# Patient Record
Sex: Female | Born: 1960
Health system: Southern US, Community
[De-identification: ages and names within clinical notes are randomized; demographics above are authoritative.]

## PROBLEM LIST (undated history)

## (undated) DIAGNOSIS — K429 Umbilical hernia without obstruction or gangrene: Secondary | ICD-10-CM

## (undated) DIAGNOSIS — R112 Nausea with vomiting, unspecified: Secondary | ICD-10-CM

## (undated) DIAGNOSIS — K760 Fatty (change of) liver, not elsewhere classified: Secondary | ICD-10-CM

## (undated) DIAGNOSIS — N390 Urinary tract infection, site not specified: Secondary | ICD-10-CM

## (undated) DIAGNOSIS — M1990 Unspecified osteoarthritis, unspecified site: Secondary | ICD-10-CM

## (undated) DIAGNOSIS — M5414 Radiculopathy, thoracic region: Secondary | ICD-10-CM

## (undated) DIAGNOSIS — I10 Essential (primary) hypertension: Secondary | ICD-10-CM

## (undated) DIAGNOSIS — R7303 Prediabetes: Secondary | ICD-10-CM

## (undated) DIAGNOSIS — K219 Gastro-esophageal reflux disease without esophagitis: Secondary | ICD-10-CM

## (undated) DIAGNOSIS — E785 Hyperlipidemia, unspecified: Secondary | ICD-10-CM

## (undated) DIAGNOSIS — G4730 Sleep apnea, unspecified: Secondary | ICD-10-CM

## (undated) DIAGNOSIS — Z9889 Other specified postprocedural states: Secondary | ICD-10-CM

## (undated) HISTORY — DX: Hyperlipidemia, unspecified: E78.5

## (undated) HISTORY — DX: Prediabetes: R73.03

## (undated) HISTORY — PX: CARPAL TUNNEL RELEASE: SHX101

## (undated) HISTORY — PX: LAMINECTOMY AND MICRODISCECTOMY LUMBAR SPINE: SHX1913

## (undated) HISTORY — DX: Radiculopathy, thoracic region: M54.14

---

## 1997-11-23 ENCOUNTER — Encounter: Admission: RE | Admit: 1997-11-23 | Discharge: 1997-11-23 | Payer: Self-pay | Admitting: Sports Medicine

## 1998-04-13 ENCOUNTER — Encounter: Admission: RE | Admit: 1998-04-13 | Discharge: 1998-04-13 | Payer: Self-pay | Admitting: Family Medicine

## 1998-04-27 ENCOUNTER — Encounter: Admission: RE | Admit: 1998-04-27 | Discharge: 1998-04-27 | Payer: Self-pay | Admitting: Family Medicine

## 1998-08-09 ENCOUNTER — Ambulatory Visit (HOSPITAL_BASED_OUTPATIENT_CLINIC_OR_DEPARTMENT_OTHER): Admission: RE | Admit: 1998-08-09 | Discharge: 1998-08-09 | Payer: Self-pay | Admitting: Orthopedic Surgery

## 1998-11-21 ENCOUNTER — Encounter: Admission: RE | Admit: 1998-11-21 | Discharge: 1998-11-21 | Payer: Self-pay | Admitting: Family Medicine

## 1999-01-27 ENCOUNTER — Encounter: Admission: RE | Admit: 1999-01-27 | Discharge: 1999-01-27 | Payer: Self-pay | Admitting: Family Medicine

## 1999-02-27 ENCOUNTER — Encounter: Admission: RE | Admit: 1999-02-27 | Discharge: 1999-02-27 | Payer: Self-pay | Admitting: Family Medicine

## 1999-02-28 ENCOUNTER — Encounter: Admission: RE | Admit: 1999-02-28 | Discharge: 1999-02-28 | Payer: Self-pay | Admitting: Sports Medicine

## 1999-04-17 ENCOUNTER — Emergency Department (HOSPITAL_COMMUNITY): Admission: EM | Admit: 1999-04-17 | Discharge: 1999-04-17 | Payer: Self-pay | Admitting: Emergency Medicine

## 1999-04-17 ENCOUNTER — Encounter: Payer: Self-pay | Admitting: Emergency Medicine

## 2000-10-08 ENCOUNTER — Encounter: Admission: RE | Admit: 2000-10-08 | Discharge: 2000-10-08 | Payer: Self-pay | Admitting: Sports Medicine

## 2001-01-29 ENCOUNTER — Encounter: Admission: RE | Admit: 2001-01-29 | Discharge: 2001-01-29 | Payer: Self-pay | Admitting: Family Medicine

## 2001-11-10 ENCOUNTER — Encounter: Admission: RE | Admit: 2001-11-10 | Discharge: 2001-11-10 | Payer: Self-pay | Admitting: Sports Medicine

## 2002-06-10 ENCOUNTER — Encounter: Admission: RE | Admit: 2002-06-10 | Discharge: 2002-06-10 | Payer: Self-pay | Admitting: Sports Medicine

## 2002-08-20 HISTORY — PX: ABDOMINAL HYSTERECTOMY: SHX81

## 2002-08-29 ENCOUNTER — Encounter: Payer: Self-pay | Admitting: Emergency Medicine

## 2002-08-29 ENCOUNTER — Emergency Department (HOSPITAL_COMMUNITY): Admission: EM | Admit: 2002-08-29 | Discharge: 2002-08-29 | Payer: Self-pay | Admitting: Emergency Medicine

## 2002-09-01 ENCOUNTER — Encounter: Payer: Self-pay | Admitting: Obstetrics and Gynecology

## 2002-09-01 ENCOUNTER — Ambulatory Visit (HOSPITAL_COMMUNITY): Admission: RE | Admit: 2002-09-01 | Discharge: 2002-09-01 | Payer: Self-pay | Admitting: Obstetrics and Gynecology

## 2002-09-22 ENCOUNTER — Other Ambulatory Visit: Admission: RE | Admit: 2002-09-22 | Discharge: 2002-09-22 | Payer: Self-pay | Admitting: Obstetrics and Gynecology

## 2002-10-21 ENCOUNTER — Inpatient Hospital Stay (HOSPITAL_COMMUNITY): Admission: RE | Admit: 2002-10-21 | Discharge: 2002-10-23 | Payer: Self-pay | Admitting: Obstetrics and Gynecology

## 2002-10-21 ENCOUNTER — Encounter (INDEPENDENT_AMBULATORY_CARE_PROVIDER_SITE_OTHER): Payer: Self-pay

## 2003-01-19 ENCOUNTER — Observation Stay (HOSPITAL_COMMUNITY): Admission: RE | Admit: 2003-01-19 | Discharge: 2003-01-20 | Payer: Self-pay | Admitting: Neurosurgery

## 2003-01-19 ENCOUNTER — Encounter: Payer: Self-pay | Admitting: Neurosurgery

## 2003-11-19 ENCOUNTER — Emergency Department (HOSPITAL_COMMUNITY): Admission: EM | Admit: 2003-11-19 | Discharge: 2003-11-19 | Payer: Self-pay | Admitting: Emergency Medicine

## 2004-01-24 ENCOUNTER — Encounter: Admission: RE | Admit: 2004-01-24 | Discharge: 2004-01-24 | Payer: Self-pay | Admitting: Family Medicine

## 2004-03-21 ENCOUNTER — Emergency Department (HOSPITAL_COMMUNITY): Admission: EM | Admit: 2004-03-21 | Discharge: 2004-03-21 | Payer: Self-pay

## 2004-03-31 ENCOUNTER — Encounter: Admission: RE | Admit: 2004-03-31 | Discharge: 2004-03-31 | Payer: Self-pay | Admitting: Family Medicine

## 2004-05-02 ENCOUNTER — Ambulatory Visit: Payer: Self-pay | Admitting: Family Medicine

## 2004-05-04 ENCOUNTER — Ambulatory Visit: Payer: Self-pay | Admitting: Family Medicine

## 2004-08-02 ENCOUNTER — Encounter: Admission: RE | Admit: 2004-08-02 | Discharge: 2004-08-02 | Payer: Self-pay | Admitting: *Deleted

## 2004-09-19 ENCOUNTER — Ambulatory Visit: Payer: Self-pay | Admitting: Family Medicine

## 2004-09-22 ENCOUNTER — Encounter: Admission: RE | Admit: 2004-09-22 | Discharge: 2004-12-21 | Payer: Self-pay | Admitting: Sports Medicine

## 2004-09-28 ENCOUNTER — Ambulatory Visit: Payer: Self-pay | Admitting: Family Medicine

## 2004-11-18 HISTORY — PX: BACK SURGERY: SHX140

## 2005-02-03 ENCOUNTER — Encounter: Admission: RE | Admit: 2005-02-03 | Discharge: 2005-02-03 | Payer: Self-pay | Admitting: Orthopedic Surgery

## 2005-02-06 ENCOUNTER — Encounter: Admission: RE | Admit: 2005-02-06 | Discharge: 2005-02-06 | Payer: Self-pay | Admitting: Orthopedic Surgery

## 2005-02-15 ENCOUNTER — Encounter: Admission: RE | Admit: 2005-02-15 | Discharge: 2005-02-15 | Payer: Self-pay | Admitting: Orthopedic Surgery

## 2005-03-02 ENCOUNTER — Ambulatory Visit: Payer: Self-pay | Admitting: Family Medicine

## 2005-05-23 ENCOUNTER — Ambulatory Visit (HOSPITAL_COMMUNITY): Admission: RE | Admit: 2005-05-23 | Discharge: 2005-05-24 | Payer: Self-pay | Admitting: Neurological Surgery

## 2005-08-31 ENCOUNTER — Ambulatory Visit: Payer: Self-pay | Admitting: Sports Medicine

## 2005-09-03 ENCOUNTER — Ambulatory Visit: Payer: Self-pay | Admitting: Sports Medicine

## 2005-09-11 ENCOUNTER — Ambulatory Visit: Payer: Self-pay | Admitting: Sports Medicine

## 2005-10-17 ENCOUNTER — Ambulatory Visit: Payer: Self-pay | Admitting: Sports Medicine

## 2005-10-25 ENCOUNTER — Ambulatory Visit: Payer: Self-pay | Admitting: Family Medicine

## 2005-11-02 ENCOUNTER — Ambulatory Visit: Payer: Self-pay | Admitting: Sports Medicine

## 2005-11-08 ENCOUNTER — Ambulatory Visit: Payer: Self-pay | Admitting: Family Medicine

## 2005-11-28 ENCOUNTER — Ambulatory Visit: Payer: Self-pay | Admitting: Family Medicine

## 2005-12-03 ENCOUNTER — Ambulatory Visit: Payer: Self-pay | Admitting: Family Medicine

## 2005-12-04 ENCOUNTER — Encounter: Admission: RE | Admit: 2005-12-04 | Discharge: 2005-12-04 | Payer: Self-pay | Admitting: Family Medicine

## 2005-12-17 ENCOUNTER — Ambulatory Visit: Payer: Self-pay | Admitting: Family Medicine

## 2005-12-19 ENCOUNTER — Ambulatory Visit: Payer: Self-pay | Admitting: Sports Medicine

## 2006-04-29 ENCOUNTER — Ambulatory Visit: Payer: Self-pay | Admitting: Sports Medicine

## 2006-05-03 ENCOUNTER — Ambulatory Visit: Payer: Self-pay | Admitting: Family Medicine

## 2006-06-19 ENCOUNTER — Emergency Department (HOSPITAL_COMMUNITY): Admission: EM | Admit: 2006-06-19 | Discharge: 2006-06-19 | Payer: Self-pay | Admitting: Emergency Medicine

## 2006-10-17 DIAGNOSIS — M1710 Unilateral primary osteoarthritis, unspecified knee: Secondary | ICD-10-CM

## 2006-10-17 DIAGNOSIS — G4700 Insomnia, unspecified: Secondary | ICD-10-CM | POA: Insufficient documentation

## 2006-10-17 DIAGNOSIS — IMO0002 Reserved for concepts with insufficient information to code with codable children: Secondary | ICD-10-CM | POA: Insufficient documentation

## 2006-10-17 DIAGNOSIS — K5900 Constipation, unspecified: Secondary | ICD-10-CM | POA: Insufficient documentation

## 2006-10-17 DIAGNOSIS — I10 Essential (primary) hypertension: Secondary | ICD-10-CM | POA: Insufficient documentation

## 2006-10-28 ENCOUNTER — Telehealth (INDEPENDENT_AMBULATORY_CARE_PROVIDER_SITE_OTHER): Payer: Self-pay | Admitting: *Deleted

## 2006-10-29 ENCOUNTER — Encounter (INDEPENDENT_AMBULATORY_CARE_PROVIDER_SITE_OTHER): Payer: Self-pay | Admitting: Family Medicine

## 2006-10-29 ENCOUNTER — Ambulatory Visit: Payer: Self-pay | Admitting: Family Medicine

## 2006-10-29 DIAGNOSIS — J309 Allergic rhinitis, unspecified: Secondary | ICD-10-CM | POA: Insufficient documentation

## 2006-10-29 DIAGNOSIS — R7989 Other specified abnormal findings of blood chemistry: Secondary | ICD-10-CM | POA: Insufficient documentation

## 2006-10-29 LAB — CONVERTED CEMR LAB
Chloride: 102 meq/L (ref 96–112)
Hgb A1c MFr Bld: 5.6 %
Potassium: 4.4 meq/L (ref 3.5–5.3)
Protein, U semiquant: NEGATIVE

## 2006-10-30 ENCOUNTER — Telehealth: Payer: Self-pay | Admitting: *Deleted

## 2006-10-30 ENCOUNTER — Telehealth (INDEPENDENT_AMBULATORY_CARE_PROVIDER_SITE_OTHER): Payer: Self-pay | Admitting: Family Medicine

## 2006-11-04 ENCOUNTER — Telehealth (INDEPENDENT_AMBULATORY_CARE_PROVIDER_SITE_OTHER): Payer: Self-pay | Admitting: *Deleted

## 2006-11-07 ENCOUNTER — Ambulatory Visit (HOSPITAL_BASED_OUTPATIENT_CLINIC_OR_DEPARTMENT_OTHER): Admission: RE | Admit: 2006-11-07 | Discharge: 2006-11-07 | Payer: Self-pay | Admitting: Family Medicine

## 2006-11-10 ENCOUNTER — Ambulatory Visit: Payer: Self-pay | Admitting: Internal Medicine

## 2006-11-10 ENCOUNTER — Encounter (INDEPENDENT_AMBULATORY_CARE_PROVIDER_SITE_OTHER): Payer: Self-pay | Admitting: Family Medicine

## 2006-11-10 DIAGNOSIS — G4733 Obstructive sleep apnea (adult) (pediatric): Secondary | ICD-10-CM | POA: Insufficient documentation

## 2006-11-27 ENCOUNTER — Encounter (INDEPENDENT_AMBULATORY_CARE_PROVIDER_SITE_OTHER): Payer: Self-pay | Admitting: Family Medicine

## 2006-12-18 ENCOUNTER — Telehealth: Payer: Self-pay | Admitting: *Deleted

## 2007-07-30 ENCOUNTER — Encounter (INDEPENDENT_AMBULATORY_CARE_PROVIDER_SITE_OTHER): Payer: Self-pay | Admitting: Family Medicine

## 2007-07-30 ENCOUNTER — Ambulatory Visit: Payer: Self-pay | Admitting: Family Medicine

## 2007-07-30 LAB — CONVERTED CEMR LAB
Hgb A1c MFr Bld: 6 %
Whiff Test: NEGATIVE

## 2007-07-31 ENCOUNTER — Encounter (INDEPENDENT_AMBULATORY_CARE_PROVIDER_SITE_OTHER): Payer: Self-pay | Admitting: Family Medicine

## 2007-07-31 LAB — CONVERTED CEMR LAB
ALT: 22 units/L (ref 0–35)
AST: 20 units/L (ref 0–37)
Albumin: 4.1 g/dL (ref 3.5–5.2)
Alkaline Phosphatase: 69 units/L (ref 39–117)
Calcium: 9 mg/dL (ref 8.4–10.5)
Chloride: 105 meq/L (ref 96–112)
Creatinine, Ser: 0.81 mg/dL (ref 0.40–1.20)
Potassium: 4.4 meq/L (ref 3.5–5.3)

## 2007-10-21 ENCOUNTER — Encounter: Admission: RE | Admit: 2007-10-21 | Discharge: 2007-10-21 | Payer: Self-pay | Admitting: Orthopedic Surgery

## 2007-11-07 ENCOUNTER — Encounter: Admission: RE | Admit: 2007-11-07 | Discharge: 2007-11-07 | Payer: Self-pay | Admitting: Orthopedic Surgery

## 2007-11-28 ENCOUNTER — Encounter: Admission: RE | Admit: 2007-11-28 | Discharge: 2007-11-28 | Payer: Self-pay | Admitting: Orthopedic Surgery

## 2007-12-19 ENCOUNTER — Encounter: Admission: RE | Admit: 2007-12-19 | Discharge: 2007-12-19 | Payer: Self-pay | Admitting: Orthopedic Surgery

## 2008-04-12 ENCOUNTER — Ambulatory Visit: Payer: Self-pay | Admitting: Sports Medicine

## 2008-04-12 ENCOUNTER — Telehealth: Payer: Self-pay | Admitting: *Deleted

## 2008-04-28 ENCOUNTER — Encounter: Admission: RE | Admit: 2008-04-28 | Discharge: 2008-04-28 | Payer: Self-pay | Admitting: Family Medicine

## 2008-05-14 ENCOUNTER — Ambulatory Visit: Payer: Self-pay | Admitting: Family Medicine

## 2008-05-14 DIAGNOSIS — M549 Dorsalgia, unspecified: Secondary | ICD-10-CM | POA: Insufficient documentation

## 2008-05-25 ENCOUNTER — Telehealth (INDEPENDENT_AMBULATORY_CARE_PROVIDER_SITE_OTHER): Payer: Self-pay | Admitting: Family Medicine

## 2008-08-20 HISTORY — PX: BILATERAL OOPHORECTOMY: SHX1221

## 2008-08-27 ENCOUNTER — Telehealth: Payer: Self-pay | Admitting: *Deleted

## 2008-08-30 ENCOUNTER — Ambulatory Visit: Payer: Self-pay | Admitting: Family Medicine

## 2008-08-30 ENCOUNTER — Encounter (INDEPENDENT_AMBULATORY_CARE_PROVIDER_SITE_OTHER): Payer: Self-pay | Admitting: Family Medicine

## 2008-08-30 DIAGNOSIS — L538 Other specified erythematous conditions: Secondary | ICD-10-CM | POA: Insufficient documentation

## 2008-08-30 LAB — CONVERTED CEMR LAB
AST: 13 units/L (ref 0–37)
BUN: 12 mg/dL (ref 6–23)
Calcium: 8.8 mg/dL (ref 8.4–10.5)
Chloride: 105 meq/L (ref 96–112)
Creatinine, Ser: 0.81 mg/dL (ref 0.40–1.20)
Glucose, Bld: 86 mg/dL (ref 70–99)
HDL: 43 mg/dL (ref >39)
TSH: 0.64 microintl units/mL (ref 0.350–4.50)
Total CHOL/HDL Ratio: 4.6
Triglycerides: 106 mg/dL (ref <150)

## 2008-09-01 ENCOUNTER — Encounter (INDEPENDENT_AMBULATORY_CARE_PROVIDER_SITE_OTHER): Payer: Self-pay | Admitting: Family Medicine

## 2009-06-20 ENCOUNTER — Ambulatory Visit: Payer: Self-pay | Admitting: Family Medicine

## 2009-06-20 ENCOUNTER — Encounter: Payer: Self-pay | Admitting: Family Medicine

## 2009-06-20 ENCOUNTER — Encounter: Admission: RE | Admit: 2009-06-20 | Discharge: 2009-06-20 | Payer: Self-pay | Admitting: Family Medicine

## 2009-06-20 LAB — CONVERTED CEMR LAB
Bilirubin Urine: NEGATIVE
Chlamydia, DNA Probe: NEGATIVE
GC Probe Amp, Genital: NEGATIVE
Glucose, Urine, Semiquant: NEGATIVE
Ketones, urine, test strip: NEGATIVE
Nitrite: NEGATIVE
Specific Gravity, Urine: 1.025
Urobilinogen, UA: 2
Whiff Test: NEGATIVE
pH: 6.5

## 2009-06-24 ENCOUNTER — Encounter: Admission: RE | Admit: 2009-06-24 | Discharge: 2009-06-24 | Payer: Self-pay | Admitting: Family Medicine

## 2009-06-30 ENCOUNTER — Ambulatory Visit: Admission: RE | Admit: 2009-06-30 | Discharge: 2009-06-30 | Payer: Self-pay | Admitting: Gynecologic Oncology

## 2009-07-18 ENCOUNTER — Telehealth: Payer: Self-pay | Admitting: Family Medicine

## 2009-07-19 ENCOUNTER — Inpatient Hospital Stay (HOSPITAL_COMMUNITY): Admission: RE | Admit: 2009-07-19 | Discharge: 2009-07-22 | Payer: Self-pay | Admitting: Obstetrics & Gynecology

## 2009-07-19 ENCOUNTER — Encounter: Payer: Self-pay | Admitting: Obstetrics & Gynecology

## 2009-08-08 ENCOUNTER — Ambulatory Visit: Admission: RE | Admit: 2009-08-08 | Discharge: 2009-08-08 | Payer: Self-pay | Admitting: Gynecologic Oncology

## 2009-08-09 ENCOUNTER — Ambulatory Visit: Payer: Self-pay | Admitting: Family Medicine

## 2009-08-09 ENCOUNTER — Encounter: Payer: Self-pay | Admitting: Family Medicine

## 2009-08-09 ENCOUNTER — Telehealth: Payer: Self-pay | Admitting: Family Medicine

## 2009-08-09 DIAGNOSIS — N3941 Urge incontinence: Secondary | ICD-10-CM | POA: Insufficient documentation

## 2009-08-10 ENCOUNTER — Encounter: Admission: RE | Admit: 2009-08-10 | Discharge: 2009-08-10 | Payer: Self-pay | Admitting: Family Medicine

## 2009-08-25 ENCOUNTER — Ambulatory Visit: Payer: Self-pay | Admitting: Family Medicine

## 2009-08-25 ENCOUNTER — Encounter: Payer: Self-pay | Admitting: Family Medicine

## 2009-08-25 LAB — CONVERTED CEMR LAB
Bilirubin Urine: NEGATIVE
Glucose, Urine, Semiquant: NEGATIVE
Ketones, urine, test strip: NEGATIVE
Specific Gravity, Urine: >=1.03
pH: 5.5

## 2009-10-05 ENCOUNTER — Ambulatory Visit: Payer: Self-pay | Admitting: Family Medicine

## 2009-10-15 ENCOUNTER — Telehealth: Payer: Self-pay | Admitting: Family Medicine

## 2009-10-18 ENCOUNTER — Encounter: Payer: Self-pay | Admitting: Family Medicine

## 2009-10-18 ENCOUNTER — Ambulatory Visit (HOSPITAL_COMMUNITY): Admission: RE | Admit: 2009-10-18 | Discharge: 2009-10-18 | Payer: Self-pay | Admitting: Family Medicine

## 2009-10-18 ENCOUNTER — Ambulatory Visit: Payer: Self-pay | Admitting: Family Medicine

## 2009-10-18 DIAGNOSIS — M279 Disease of jaws, unspecified: Secondary | ICD-10-CM | POA: Insufficient documentation

## 2009-10-18 DIAGNOSIS — K219 Gastro-esophageal reflux disease without esophagitis: Secondary | ICD-10-CM

## 2009-10-18 HISTORY — DX: Gastro-esophageal reflux disease without esophagitis: K21.9

## 2009-10-18 LAB — CONVERTED CEMR LAB
ALT: 29 units/L (ref 0–35)
AST: 20 units/L (ref 0–37)
Basophils Relative: 1 % (ref 0–1)
CO2: 26 meq/L (ref 19–32)
Chloride: 104 meq/L (ref 96–112)
Creatinine, Ser: 0.92 mg/dL (ref 0.40–1.20)
Eosinophils Absolute: 0.1 10*3/uL (ref 0.0–0.7)
Lymphs Abs: 3.5 10*3/uL (ref 0.7–4.0)
MCV: 95 fL (ref 78.0–100.0)
Monocytes Relative: 6 % (ref 3–12)
Neutro Abs: 2 10*3/uL (ref 1.7–7.7)
Neutrophils Relative %: 34 % — ABNORMAL LOW (ref 43–77)
Platelets: 215 10*3/uL (ref 150–400)
RBC: 4.24 M/uL (ref 3.87–5.11)
Sodium: 141 meq/L (ref 135–145)
Total Bilirubin: 0.3 mg/dL (ref 0.3–1.2)
Total Protein: 7.1 g/dL (ref 6.0–8.3)
WBC: 6 10*3/uL (ref 4.0–10.5)

## 2009-10-19 ENCOUNTER — Encounter: Payer: Self-pay | Admitting: Family Medicine

## 2009-10-20 ENCOUNTER — Encounter: Payer: Self-pay | Admitting: Family Medicine

## 2009-12-22 ENCOUNTER — Ambulatory Visit: Payer: Self-pay | Admitting: Family Medicine

## 2009-12-22 DIAGNOSIS — H6120 Impacted cerumen, unspecified ear: Secondary | ICD-10-CM | POA: Insufficient documentation

## 2009-12-22 DIAGNOSIS — H60399 Other infective otitis externa, unspecified ear: Secondary | ICD-10-CM | POA: Insufficient documentation

## 2010-01-23 ENCOUNTER — Encounter: Payer: Self-pay | Admitting: Family Medicine

## 2010-01-23 ENCOUNTER — Ambulatory Visit: Payer: Self-pay | Admitting: Family Medicine

## 2010-01-23 DIAGNOSIS — J329 Chronic sinusitis, unspecified: Secondary | ICD-10-CM | POA: Insufficient documentation

## 2010-01-23 LAB — CONVERTED CEMR LAB
Alkaline Phosphatase: 72 units/L (ref 39–117)
CO2: 25 meq/L (ref 19–32)
Creatinine, Ser: 0.86 mg/dL (ref 0.40–1.20)
Glucose, Bld: 89 mg/dL (ref 70–99)
Total Bilirubin: 0.5 mg/dL (ref 0.3–1.2)

## 2010-01-24 ENCOUNTER — Encounter: Payer: Self-pay | Admitting: Family Medicine

## 2010-02-03 ENCOUNTER — Encounter: Payer: Self-pay | Admitting: Family Medicine

## 2010-02-22 ENCOUNTER — Ambulatory Visit: Payer: Self-pay | Admitting: Family Medicine

## 2010-02-22 DIAGNOSIS — M79609 Pain in unspecified limb: Secondary | ICD-10-CM | POA: Insufficient documentation

## 2010-03-29 DIAGNOSIS — M5414 Radiculopathy, thoracic region: Secondary | ICD-10-CM

## 2010-03-29 HISTORY — DX: Radiculopathy, thoracic region: M54.14

## 2010-04-18 ENCOUNTER — Encounter: Payer: Self-pay | Admitting: Family Medicine

## 2010-04-18 ENCOUNTER — Ambulatory Visit: Payer: Self-pay | Admitting: Family Medicine

## 2010-04-18 DIAGNOSIS — R1011 Right upper quadrant pain: Secondary | ICD-10-CM | POA: Insufficient documentation

## 2010-04-18 LAB — CONVERTED CEMR LAB
AST: 16 units/L (ref 0–37)
Albumin: 4.4 g/dL (ref 3.5–5.2)
Alkaline Phosphatase: 96 units/L (ref 39–117)
Bilirubin Urine: NEGATIVE
Blood in Urine, dipstick: NEGATIVE
CO2: 29 meq/L (ref 19–32)
Chloride: 104 meq/L (ref 96–112)
Glucose, Urine, Semiquant: NEGATIVE
HCT: 42.3 % (ref 36.0–46.0)
Hemoglobin: 13.8 g/dL (ref 12.0–15.0)
Ketones, urine, test strip: NEGATIVE
Platelets: 206 10*3/uL (ref 150–400)
Protein, U semiquant: NEGATIVE
RBC: 4.58 M/uL (ref 3.87–5.11)
Total Bilirubin: 0.5 mg/dL (ref 0.3–1.2)
Total Protein: 6.9 g/dL (ref 6.0–8.3)
Urobilinogen, UA: 1
WBC: 6.7 10*3/uL (ref 4.0–10.5)
pH: 6.5

## 2010-04-20 ENCOUNTER — Encounter: Payer: Self-pay | Admitting: Family Medicine

## 2010-04-20 ENCOUNTER — Ambulatory Visit (HOSPITAL_COMMUNITY): Admission: RE | Admit: 2010-04-20 | Discharge: 2010-04-20 | Payer: Self-pay | Admitting: Family Medicine

## 2010-04-25 ENCOUNTER — Ambulatory Visit: Payer: Self-pay | Admitting: Family Medicine

## 2010-04-25 ENCOUNTER — Encounter (INDEPENDENT_AMBULATORY_CARE_PROVIDER_SITE_OTHER): Payer: Self-pay | Admitting: *Deleted

## 2010-04-25 ENCOUNTER — Telehealth: Payer: Self-pay | Admitting: Family Medicine

## 2010-04-28 ENCOUNTER — Encounter: Payer: Self-pay | Admitting: Family Medicine

## 2010-04-28 ENCOUNTER — Ambulatory Visit: Payer: Self-pay | Admitting: Family Medicine

## 2010-04-28 ENCOUNTER — Inpatient Hospital Stay (HOSPITAL_COMMUNITY): Admission: EM | Admit: 2010-04-28 | Discharge: 2010-05-01 | Payer: Self-pay | Admitting: Emergency Medicine

## 2010-05-09 ENCOUNTER — Encounter: Payer: Self-pay | Admitting: Family Medicine

## 2010-05-09 ENCOUNTER — Ambulatory Visit: Payer: Self-pay | Admitting: Family Medicine

## 2010-05-09 DIAGNOSIS — K589 Irritable bowel syndrome without diarrhea: Secondary | ICD-10-CM | POA: Insufficient documentation

## 2010-05-15 ENCOUNTER — Telehealth: Payer: Self-pay | Admitting: Family Medicine

## 2010-05-15 ENCOUNTER — Encounter: Payer: Self-pay | Admitting: Family Medicine

## 2010-05-16 ENCOUNTER — Encounter: Payer: Self-pay | Admitting: Family Medicine

## 2010-05-22 ENCOUNTER — Ambulatory Visit: Payer: Self-pay | Admitting: Gastroenterology

## 2010-05-31 ENCOUNTER — Telehealth: Payer: Self-pay | Admitting: Gastroenterology

## 2010-06-02 ENCOUNTER — Telehealth: Payer: Self-pay | Admitting: Gastroenterology

## 2010-06-05 ENCOUNTER — Ambulatory Visit: Payer: Self-pay | Admitting: Gastroenterology

## 2010-06-05 ENCOUNTER — Encounter: Admission: RE | Admit: 2010-06-05 | Discharge: 2010-06-05 | Payer: Self-pay | Admitting: Orthopedic Surgery

## 2010-06-09 ENCOUNTER — Encounter: Admission: RE | Admit: 2010-06-09 | Discharge: 2010-06-09 | Payer: Self-pay | Admitting: Orthopedic Surgery

## 2010-09-19 NOTE — Letter (Signed)
Summary: Generic Letter  [name]  [address]   [place], [place] [address]   Phone: [phone]  Fax: [phone]    04/20/2010  [name] [address] [place], [place]  [address]  Dear Ms. Rail,   All of your labs from last week are normal, which is great news!  I am awaiting the results of your ultrasound, as soon as I recieve a report, I will let you know. Please call my office if you have any questions.         Sincerely,   Alvia Grove DO  Appended Document: Generic Letter mailed

## 2010-09-19 NOTE — Letter (Signed)
Summary: Generic Letter  [name]  [address]   [place], [place] [address]   Phone: [phone]  Fax: [phone]    01/24/2010  DONNA SNOOKS [address] [place], [place]  [address]  Dear Ms. Berrones,    Your recent blood work looked great!  I want you to increase your HCTZ to 25mg  daily.  You can take 2 of your 12.5mg  pills once a day until you run out.  I have sent a new prescription to [facility] for you.        Sincerely,   Alvia Grove DO  Appended Document: Generic Letter mailed

## 2010-09-19 NOTE — Letter (Signed)
Summary: [facility] Instructions  [facility]  [address] [place], [place] [address]   Phone: [phone]  Fax: [phone]       KRISTYANNA BARCELO    27-Nov-2004    MRN: [id number]        Procedure Day /Date:MONDAY 06/05/2010     Arrival Time:2:30PM     Procedure Time:3:30PM     Location of Procedure:                    X  [facility] (4th Floor)   PREPARATION FOR COLONOSCOPY WITH MOVIPREP   Starting 5 days prior to your procedure 05/31/2010 do not eat nuts, seeds, popcorn, corn, beans, peas,  salads, or any raw vegetables.  Do not take any fiber supplements (e.g. Metamucil, Citrucel, and Benefiber).  THE DAY BEFORE YOUR PROCEDURE         DATE: 06/04/2010  DAY: SUNDAY  1.  Drink clear liquids the entire day-NO SOLID FOOD  2.  Do not drink anything colored red or purple.  Avoid juices with pulp.  No orange juice.  3.  Drink at least 64 oz. (8 glasses) of fluid/clear liquids during the day to prevent dehydration and help the prep work efficiently.  CLEAR LIQUIDS INCLUDE: Water Jello Ice Popsicles Tea (sugar ok, no milk/cream) Powdered fruit flavored drinks Coffee (sugar ok, no milk/cream) Gatorade Juice: apple, white grape, white cranberry  Lemonade Clear bullion, consomm, broth Carbonated beverages (any kind) Strained chicken noodle soup Hard Candy                             4.  In the morning, mix first dose of MoviPrep solution:    Empty 1 Pouch A and 1 Pouch B into the disposable container    Add lukewarm drinking water to the top line of the container. Mix to dissolve    Refrigerate (mixed solution should be used within 24 hrs)  5.  Begin drinking the prep at 5:00 p.m. The MoviPrep container is divided by 4 marks.   Every 15 minutes drink the solution down to the next mark (approximately 8 oz) until the full liter is complete.   6.  Follow completed prep with 16 oz of clear liquid of your choice (Nothing red or purple).   Continue to drink clear liquids until bedtime.  7.  Before going to bed, mix second dose of MoviPrep solution:    Empty 1 Pouch A and 1 Pouch B into the disposable container    Add lukewarm drinking water to the top line of the container. Mix to dissolve    Refrigerate  THE DAY OF YOUR PROCEDURE      DATE: 06/05/2010 DAY: MONDAY  Beginning at 10:30a.m. (5 hours before procedure):         1. Every 15 minutes, drink the solution down to the next mark (approx 8 oz) until the full liter is complete.  2. Follow completed prep with 16 oz. of clear liquid of your choice.    3. You may drink clear liquids until 1:30PM (2 HOURS BEFORE PROCEDURE).   MEDICATION INSTRUCTIONS  Unless otherwise instructed, you should take regular prescription medications with a small sip of water   as early as possible the morning of your procedure.       OTHER INSTRUCTIONS  You will need a responsible adult at least 52 years of age to accompany you and drive you  home.   This person must remain in the waiting room during your procedure.  Wear loose fitting clothing that is easily removed.  Leave jewelry and other valuables at home.  However, you may wish to bring a book to read or  an iPod/MP3 player to listen to music as you wait for your procedure to start.  Remove all body piercing jewelry and leave at home.  Total time from sign-in until discharge is approximately 2-3 hours.  You should go home directly after your procedure and rest.  You can resume normal activities the  day after your procedure.  The day of your procedure you should not:   Drive   Make legal decisions   Operate machinery   Drink alcohol   Return to work  You will receive specific instructions about eating, activities and medications before you leave.    The above instructions have been reviewed and explained to me by   _______________________    I fully understand and can verbalize these instructions  _____________________________ Date _________

## 2010-09-19 NOTE — Miscellaneous (Signed)
Summary: prior auth  Clinical Lists Changes pa for beconase to [facility] RN  January 24, 2010 10:59 AM  Appended Document: prior Berkley Harvey was returned from [facility]. now it is [employer] of [place]. unable to find form on internet. called & LM for pt to call here. need to know if she has part D medicare? what variation of bcbs of [place]?  Appended Document: prior auth she does not have part D which would pay for meds. so, she needs to use medicaid. must ry & fail 2 other meds before she can apply for beconase. apptoved list to pcp

## 2010-09-19 NOTE — Assessment & Plan Note (Signed)
Summary: Hospital H and P   Vital Signs:  Patient profile:   50 year old female O2 Sat:      97 % on Room air Temp:     97.7 degrees F Pulse rate:   95 / minute Resp:     16 per minute BP supine:   144 / 89  O2 Flow:  Room air  Primary Care Provider:  Alvia Grove DO   History of Present Illness: 50 yo AAF, who presented to the ED due to worsening RUQ abdominal pain and nausea.  Pain is located in her RUQ and radiates to her back.  Pain is worsening since last OV and increasing in frequency and intensity. Occurs with by mouth intake at times, but at other times pain is not associated with anything.  Pt does endorse some heart burn symptoms associated with pain and has been taking omeprazole with some relief. Ultrasound was completed last week in the outpatient setting due to concern for gallstones/inflammation, report showed no gallstones and no ductal dilataion. Labs in office were unremarkable.  Patient seen again by me on Tuesday and plan at that time was to refer her to GI for further work-up.  Patient was unable to tolerate the pain and thus presented to the ED today.  She does have a hx of chronic constipation and was just started on amitiza, which she does endorse taking, but has not noticed a signifigant improvement in regards to her constipation. Last BM today, no blood in stools.   Pt did recieve IV Dialudid in ED for pain control and subsequently became very nauseated and did have multiple episodes of billious emesis.  She was started on IVF and the EDP requested admission for hydration, pain control and an expedited evaluation.   Habits & Providers  Alcohol-Tobacco-Diet     Alcohol drinks/day: 0     Tobacco Status: never  Current Problems (verified): 1)  Abdominal Pain Right Upper Quadrant  (ICD-789.01) 2)  Leg Pain, Bilateral  (ICD-729.5) 3)  Leg Pain, Bilateral  (ICD-729.5) 4)  Sinusitis, Recurrent  (ICD-473.9) 5)  Cerumen Impaction, Bilateral  (ICD-380.4) 6)  Otitis  Externa, Bilateral  (ICD-380.10) 7)  Gerd  (ICD-530.81) 8)  Jaw Pain  (ICD-526.9) 9)  Incontinence, Urge  (ICD-788.31) 10)  Intertrigo  (ICD-695.89) 11)  Back Pain, Chronic  (ICD-724.5) 12)  Sleep Apnea, Obstructive, Severe  (ICD-327.23) 13)  Hyperglycemia  (ICD-790.6) 14)  Rhinitis, Allergic Nos  (ICD-477.9) 15)  Osteoarthritis, Lower Leg  (ICD-715.96) 16)  Obesity, Nos  (ICD-278.00) 17)  Insomnia Nos  (ICD-780.52) 18)  Hypertension, Benign Systemic  (ICD-401.1) 19)  Constipation  (ICD-564.0)  Current Medications (verified): 1)  Meloxicam 7.5 Mg Tabs (Meloxicam) .... One To Two Tablets Daily As Needed For Pain 2)  Miralax   Powd (Polyethylene Glycol 3350) .Marland Kitchen.. 17g By Mouth Up To Two Times A Day As Needed Constipation. Dispense One Bottle. 3)  Meperitab 50 Mg Tabs (Meperidine Hcl) .... One By Mouth Three Times A Day As Needed Pain 4)  Promethazine Hcl 25 Mg Tabs (Promethazine Hcl) .... One By Mouth Three Times A Day As Needed 5)  Detrol 2 Mg Tabs (Tolterodine Tartrate) .... Two Times A Day 6)  Cvs Omeprazole 20 Mg Tbec (Omeprazole) .... Take 1 Pill By Mouth 7)  Fluticasone Propionate 50 Mcg/act Susp (Fluticasone Propionate) .... One Puff Each Nostril Daily 8)  Amitiza 24 Mcg Caps (Lubiprostone) .... Take 1 Pill By Mouth Tid 9)  Lactulose  Soln (Lactulose) .Marland KitchenMarland KitchenMarland Kitchen  Use 30 Ml By Mouth Two Times A Day As Needed Constipation (Max Dose of 57ml/day)  Allergies (verified): 1)  Naprosyn (Naproxen)  Past History:  Past Medical History: Last updated: 05/14/2008 Iron deficiency anemia resolved s/p TAH (fibroid tumors)  knee dislocation 4/05  obesity bilateral knee OA Chronic back pain s/p multiple surgeries - follow by Dr Charlett Blake [facility] h/o post partum depression  Past Surgical History: Last updated: 02/22/2010 lamenectomy L4-5 - 01/18/2005 rt knee aspiration (Voytec) - 07/20/2005 Surgery for L3/4 herniation with nerve impingement - 01/19/2003 TAH (no BSO) - 2* to DUB, fibroids -  10/19/2002 06/2009: Exploratory laparotomy, Lysis of adhesions,  Bilateral salpingo-oophorectomy.    Family History: Last updated: 04/25/2010 Father - 71y, HTN, DM Mother - 66y, HTN, DM No h/o CAD, CVA, CA Colon ca in Uncle  Social History: Last updated: 04/28/2010 Has three children, 2 girls and one boy.  No tob/ETOH/drugs.  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 .  Married Never Smoked  Risk Factors: Alcohol Use: 0 (04/28/2010)  Risk Factors: Smoking Status: never (04/28/2010)  Family History: Reviewed history from 04/25/2010 and no changes required. Father - 70y, HTN, DM Mother - 82y, HTN, DM No h/o CAD, CVA, CA Colon ca in Uncle  Social History: Reviewed history from 04/25/2010 and no changes required. Has three children, 2 girls and one boy.  No tob/ETOH/drugs.  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 .  Married Never Smoked  Review of Systems       The patient complains of abdominal pain.  The patient denies fever, weight loss, chest pain, syncope, dyspnea on exertion, headaches, melena, hematochezia, hematuria, muscle weakness, and abnormal bleeding.   GI:  Complains of abdominal pain, constipation, indigestion, and nausea.  Physical Exam  General:  Vs reviewed, alert, well-hydrated, and overweight-appearing.   Head:  normocephalic and atraumatic.   Eyes:  vision grossly intact.   Mouth:  pharynx pink and moist and postnasal drip.   Neck:  supple, full ROM, and no masses.   Lungs:  normal respiratory effort, normal breath sounds, no crackles, and no wheezes Heart:  normal rate, regular rhythm, no murmur, NOT tachycardic Abdomen:  soft, normal bowel sounds, no distention, no masses, no abdominal hernia, no hepatomegaly, epigastric tenderness, and RUQ tenderness.   Msk:  no joint tenderness and no joint swelling.   Extremities:  trace left  pedal edema and trace right pedal edema,  Neurologic:  alert & oriented X3.   Skin:  turgor normal, color normal, and no rashes.   Cervical Nodes:  No lymphadenopathy noted Psych:  Oriented X3, normally interactive, and good eye contact.    Labs/imagaing in ED:  Lipase: 34 CMP WNL with exception of elevated glucose of 110 CBC w/diff: WNL UA: signifigant for trace LE Microscopic: rare squamous and rare bacteria  CXR: negative chest  CT of abd and pelvis with contrast: 1. Mild gaseous distention of the colon compatible with mild ileus. 2.  Fatty infiltration of the liver and hepatomegaly. 3.  Fat containing ventral hernia.  Impression & Recommendations:  Problem # 1:  ABDOMINAL PAIN RIGHT UPPER QUADRANT (ICD-789.01) Assessment Deteriorated Ultrasound obtained on 04-20-10 did not show gallstones or inflammation.  CT of abd/pelvis completed today, may be consistent with MILD ileus, but I question that this would be the source of ALL of her pain.  At this point I feel the cause remains unclear.  Could be multifactiorial and I feel an in dept work up is warrented at this point.  Will get HIDA scan today and likely consult GI for further reccomendations. ? that this could be completely due to PUD as pain seems to be unproportional to dx. Will check H. pylori serology.    Pt was scheduled to see GI in outpatient setting and get a colonoscopy, but she was not able to get an appointment until October.  This may be something to conisder during this admission if other sources are not found and pain does not improve. For now will continue Zofran, Phenergan and Ativan as needed for n/v.  Diluadid as needed for pain, with intentions to wean quickly and as pt tolerates.  PPI two times a day, Lactulose as needed constipation and scheduled Colace.  Her updated medication list for this problem includes:    Amitiza 24 Mcg Caps (Lubiprostone) .Marland Kitchen... Take 1 pill by mouth tid  Problem # 2:  GERD  (ICD-530.81) PPI IV two times a day for now.  Transition to by mouth when tolerating oral intake Her updated medication list for this problem includes:    Cvs Omeprazole 20 Mg Tbec (Omeprazole) .Marland Kitchen... Take 1 pill by mouth  Problem # 3:  HYPERTENSION, BENIGN SYSTEMIC (ICD-401.1) BP mildly elevated today, likely due to acute pain.  Will continue HCTZ per home dose. Hold for SBP<110  Problem # 4:  CONSTIPATION (ICD-564.0) As #1 Her updated medication list for this problem includes:    Miralax Powd (Polyethylene glycol 3350) .Marland KitchenMarland KitchenMarland KitchenMarland Kitchen 17g by mouth up to two times a day as needed constipation. dispense one bottle.  Problem # 5:  INCONTINENCE, URGE (ICD-788.31) Continue detrol per home dose  Problem # 6:  INSOMNIA NOS (ICD-780.52) Ambien 10mg  by mouth QHS  Problem # 7:  FEN/GI Clear liquid diet,  advancet as tolerated.  Until tolerating continue IVF NS @ 125 mL/hr. NPO 2 hours prior to HIDA scan electrolytes normal in ED.  Will recheck this AM and replete as necc.  Problem # 8:  Code Full  Problem # 9:  PPx Heparin 5000 units Subcutaneously Q8 hours  Problem # 10:  Dispo Pending improvement of pain and n/v.    Complete Medication List: 1)  Meloxicam 7.5 Mg Tabs (Meloxicam) .... One to two tablets daily as needed for pain 2)  Miralax Powd (Polyethylene glycol 3350) .Marland Kitchen.. 17g by mouth up to two times a day as needed constipation. dispense one bottle. 3)  Meperitab 50 Mg Tabs (Meperidine hcl) .... One by mouth three times a day as needed pain 4)  Promethazine Hcl 25 Mg Tabs (Promethazine hcl) .... One by mouth three times a day as needed 5)  Detrol 2 Mg Tabs (Tolterodine tartrate) .... Two times a day 6)  Cvs Omeprazole 20 Mg Tbec (Omeprazole) .... Take 1 pill by mouth 7)  Fluticasone Propionate 50 Mcg/act Susp (Fluticasone propionate) .... One puff each nostril daily 8)  Amitiza 24 Mcg Caps (Lubiprostone) .... Take 1 pill by mouth tid 9)  Lactulose Soln (Lactulose) .... Use 30 ml by  mouth two times a day as needed constipation (max dose of 79ml/day)

## 2010-09-19 NOTE — Progress Notes (Signed)
Summary: phn msg  Phone Note Call from Patient Call back at [phone]   Caller: Patient Summary of Call: pt's stomach is still distented and has taken 6 laxitives since last night - not sure what to do at this time  Initial call taken by: De Nurse,  April 25, 2010 10:21 AM  Follow-up for Phone Call        had a bm this am. has taken 6 colace. sunday took a cup of MOM. pain in RUQ. c/o bloated stomach.  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 thinks it is back or the scar tissue is causing this problem. in pain. asked her to come now. she is on her way Follow-up by: Golden Circle RN,  April 25, 2010 11:00 AM

## 2010-09-19 NOTE — Progress Notes (Signed)
Summary: Something for nausea  Phone Note Call from Patient Call back at [phone]   Call For: Dr Arlyce Dice Reason for Call: Talk to Nurse Summary of Call: Needs something for nausea whenever she is sedated. Initial call taken by: Leanor Kail [facility],  June 02, 2010 12:39 PM  Follow-up for Phone Call        Patient states that any narcotics make her very nauseated, she is requesting phenergan or zofran along with her sedation on Monday.  I have advised the patient that we do keep both here in the case she will need it.  Jennye Boroughs is notified of the patient's request. Follow-up by: Darcey Nora RN, CGRN,  June 02, 2010 1:37 PM  Additional Follow-up for Phone Call Additional follow up Details #1::        ok Additional Follow-up by: Louis Meckel MD,  June 02, 2010 4:02 PM

## 2010-09-19 NOTE — Assessment & Plan Note (Signed)
Summary: ?,tcb   Vital Signs:  Patient profile:   50 year old female Height:      66.4 inches Weight:      284 pounds BMI:     45.45 BSA:     2.33 Temp:     98.4 degrees F Pulse rate:   80 / minute BP sitting:   129 / 81  Vitals Entered By: Jone Baseman CMA (February 22, 2010 11:40 AM) CC: calf pain x 2 weeks Is Patient Diabetic? No Pain Assessment Patient in pain? yes     Location: calfs Intensity: 5   Primary Care Provider:  Alvia Grove DO  CC:  calf pain x 2 weeks.  History of Present Illness: 50 yo female here with bilateral leg pain. Pt reports pain present x 2 weeks.  R>L. No recent trauma or injury, unsure what brought on pain.  Pain located in both LE behind the knees.  Pain worse in the morning, improves thruout the day.  Worsens with repetative activity, esp. going up stairs. Endorses mild swelling of both LE for the past 2 weeks.  Has been using compression stockings and elevating legs as able. No previous hx of DVT or PE, no recent surgery or immobilization, no recent long distance travel, no malignancy tx in last 6 months, no hemoptysis, not on oral contraceptives, no family or personal hx of coagulation disorders. Does have hx of OA.   No SOB, no c/p, no racing heart     Habits & Providers  Alcohol-Tobacco-Diet     Tobacco Status: never  Current Problems (verified): 1)  Leg Pain, Bilateral  (ICD-729.5) 2)  Sinusitis, Recurrent  (ICD-473.9) 3)  Cerumen Impaction, Bilateral  (ICD-380.4) 4)  Otitis Externa, Bilateral  (ICD-380.10) 5)  Gerd  (ICD-530.81) 6)  Jaw Pain  (ICD-526.9) 7)  Incontinence, Urge  (ICD-788.31) 8)  Intertrigo  (ICD-695.89) 9)  Back Pain, Chronic  (ICD-724.5) 10)  Sleep Apnea, Obstructive, Severe  (ICD-327.23) 11)  Hyperglycemia  (ICD-790.6) 12)  Rhinitis, Allergic Nos  (ICD-477.9) 13)  Osteoarthritis, Lower Leg  (ICD-715.96) 14)  Obesity, Nos  (ICD-278.00) 15)  Insomnia Nos  (ICD-780.52) 16)  Hypertension, Benign Systemic   (ICD-401.1) 17)  Constipation  (ICD-564.0)  Current Medications (verified): 1)  Meloxicam 7.5 Mg Tabs (Meloxicam) .... One To Two Tablets Daily As Needed For Pain 2)  Miralax   Powd (Polyethylene Glycol 3350) .Marland Kitchen.. 17g By Mouth Up To Two Times A Day As Needed Constipation. Dispense One Bottle. 3)  Meperitab 50 Mg Tabs (Meperidine Hcl) .... One By Mouth Three Times A Day As Needed Pain 4)  Promethazine Hcl 25 Mg Tabs (Promethazine Hcl) .... One By Mouth Three Times A Day As Needed 5)  Detrol 2 Mg Tabs (Tolterodine Tartrate) .... Two Times A Day 6)  Cvs Omeprazole 20 Mg Tbec (Omeprazole) .... Take 1 Pill By Mouth 7)  Fluticasone Propionate 50 Mcg/act Susp (Fluticasone Propionate) .... One Puff Each Nostril Daily  Allergies (verified): 1)  Naprosyn (Naproxen)  Past History:  Past Medical History: Last updated: 05/14/2008 Iron deficiency anemia resolved s/p TAH (fibroid tumors)  knee dislocation 4/05  obesity bilateral knee OA Chronic back pain s/p multiple surgeries - follow by Dr Charlett Blake of [facility] h/o post partum depression  Family History: Last updated: 10/29/2006 Father - 71y, HTN, DM Mother - 14y, HTN, DM No h/o CAD, CVA, CA  Social History: Last updated: 08/30/2008 Has three children, 2 girls and one boy.  1 occasional sexual partner.  No  tob/ETOH/drugs.  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 Son has become a behavior problem and stresses her signficantly.   Risk Factors: Alcohol Use: 0 (01/23/2010)  Risk Factors: Smoking Status: never (02/22/2010)  Past Surgical History: lamenectomy L4-5 - 01/18/2005 rt knee aspiration (Voytec) - 07/20/2005 Surgery for L3/4 herniation with nerve impingement - 01/19/2003 TAH (no BSO) - 2* to DUB, fibroids - 10/19/2002 06/2009: Exploratory laparotomy, Lysis of adhesions,  Bilateral salpingo-oophorectomy.    Review of Systems       see hpi  Physical Exam  General:  Vs reviewed, alert,  well-developed, well-nourished, and well-hydrated.   Lungs:  normal respiratory effort, normal breath sounds, no crackles, and no wheezes Heart:  normal rate, regular rhythm, no murmur, NOT tachycardic Msk:  bilateral knee pain along joint line Pulses:  R popliteal normal, R posterior tibial normal, R dorsalis pedis normal, L popliteal normal, L posterior tibial normal, and L dorsalis pedis normal.   Extremities:  trace left pedal edema and trace right pedal edema, NOT pitting. Right calf 18.5cm, left calf 17.5 cm.  Not warm, not erythematous.  No calf pain.   Neurologic:  strength normal in all extremities and DTRs symmetrical and normal.     Knee Exam  Knee Exam:    Right:    Range of Motion:       Flexion-Active: full       Extension-Active: full       Flexion-Passive: full       Extension-Passive: full    Left:    Range of Motion:       Flexion-Active: full       Extension-Active: full       Flexion-Passive: full       Extension-Passive: full   Impression & Recommendations:  Problem # 1:  LEG PAIN, BILATERAL (ICD-729.5) Assessment New Precepted with Dr. Sharion Settler criteria: 0 Very low probability for DVT.  Exam not consistent with DVT.  VS stable, see hpi for full history Advised pt ok to take ASA, but this may upset her GERD, pt will wait on this. If no better or acutely worsening, low threshold for LE dopplers.  Offered to pt today, but she declined. Felt re-assured based on above. Likely due to OA, pt to continue compression stockings and f/u with ortho. return in 1 week if no better or worsening acutely.  see pt instructions Orders: [facility]- Est Level  3 (29528)  Complete Medication List: 1)  Meloxicam 7.5 Mg Tabs (Meloxicam) .... One to two tablets daily as needed for pain 2)  Miralax Powd (Polyethylene glycol 3350) .Marland Kitchen.. 17g by mouth up to two times a day as needed constipation. dispense one bottle. 3)  Meperitab 50 Mg Tabs (Meperidine hcl) .... One by mouth three  times a day as needed pain 4)  Promethazine Hcl 25 Mg Tabs (Promethazine hcl) .... One by mouth three times a day as needed 5)  Detrol 2 Mg Tabs (Tolterodine tartrate) .... Two times a day 6)  Cvs Omeprazole 20 Mg Tbec (Omeprazole) .... Take 1 pill by mouth 7)  Fluticasone Propionate 50 Mcg/act Susp (Fluticasone propionate) .... One puff each nostril daily  Patient Instructions: 1)  So good to see you today! 2)  I'm sorry your legs are hurting you. 3)  As we talked about today, I think the likelyhood of you having a DVT is very low, however, if you are not getting better in the  next week or if your pain worsens, it would be reasonable to get LE dopplers.  I know you don't want them today, but if you change your mind, we can get them later this week. 4)  Continue to elevate your legs and use your compression stockings. 5)  It's ok to take an asprin daily, but this may upset your GERD.  6)  Let me know how you are doing.

## 2010-09-19 NOTE — Assessment & Plan Note (Signed)
Summary: IBS//discuss colon--ch.   History of Present Illness Visit Type: consult  Primary GI MD: Anne Heaps MD [facility] Primary Provider: Alvia Grove DO Requesting Provider: Alvia Grove DO Chief Complaint: Right side abd pain, bloating, belching, GERD, constipation, and IBS History of Present Illness:   [name] is a 50 year old Afro-American female referred at the request of Dr. Gomez Cleverly for evaluation of abdominal pain and constipation.  She was recently hospitalized for right upper quadrant pain that was felt to be due to musculoskeletal pain.  Abdominal ultrasound did not demonstrate any biliary tract pathology.  She has been complaining of severe right-sided abdominal pain that worsens with movements.  She is chronically constipated but obtains relief by using MiraLax and amitiza She takes narcotics regularly because of chronic back pain.  Upper abdominal pain does not change with eating or bowel movements.  She has occasional pyrosis.  She is on omeprazole daily.   GI Review of Systems    Reports abdominal pain, acid reflux, belching, bloating, and  chest pain.     Location of  Abdominal pain: right side.    Denies dysphagia with liquids, dysphagia with solids, heartburn, loss of appetite, nausea, vomiting, vomiting blood, weight loss, and  weight gain.      Reports constipation and  irritable bowel syndrome.     Denies anal fissure, black tarry stools, change in bowel habit, diarrhea, diverticulosis, fecal incontinence, heme positive stool, hemorrhoids, jaundice, light color stool, liver problems, rectal bleeding, and  rectal pain.    Current Medications (verified): 1)  Meloxicam 7.5 Mg Tabs (Meloxicam) .... One To Two Tablets Daily As Needed For Pain 2)  Miralax   Powd (Polyethylene Glycol 3350) .Marland Kitchen.. 17g By Mouth Up To Two Times A Day As Needed Constipation. Dispense One Bottle. 3)  Meperitab 50 Mg Tabs (Meperidine Hcl) .... One By Mouth Three Times A Day As Needed Pain 4)   Promethazine Hcl 25 Mg Tabs (Promethazine Hcl) .... One By Mouth Three Times A Day As Needed 5)  Detrol 2 Mg Tabs (Tolterodine Tartrate) .... Two Times A Day 6)  Cvs Omeprazole 20 Mg Tbec (Omeprazole) .... Take 1 Pill By Mouth 7)  Fluticasone Propionate 50 Mcg/act Susp (Fluticasone Propionate) .... One Puff Each Nostril Daily 8)  Amitiza 24 Mcg Caps (Lubiprostone) .... Take 1 Pill By Mouth Tid 9)  Hydrochlorothiazide 25 Mg Tabs (Hydrochlorothiazide) .... One Tablet By Mouth Once Daily 10)  Cpap .... As Directed  Allergies (verified): 1)  Naprosyn (Naproxen)  Past History:  Past Medical History: Iron deficiency anemia resolved s/p TAH (fibroid tumors)  knee dislocation 4/05  obesity bilateral knee OA Chronic back pain s/p multiple surgeries - follow by Dr Charlett Blake [facility] h/o post partum depression Gastroesophageal reflux disease Chronic constipation  Musculoskeletal chest pain  Hypertension Urge incontinence Insomnia Obstructive sleep apnea Fatty Liver  Urinary Tract Infection Irritable Bowel Syndrome Arthritis  Past Surgical History: Reviewed history from 02/22/2010 and no changes required. lamenectomy L4-5 - 01/18/2005 rt knee aspiration (Voytec) - 07/20/2005 Surgery for L3/4 herniation with nerve impingement - 01/19/2003 TAH (no BSO) - 2* to DUB, fibroids - 10/19/2002 06/2009: Exploratory laparotomy, Lysis of adhesions,  Bilateral salpingo-oophorectomy.    Family History: Father - 81y, HTN, DM Mother - 52y, HTN, DM No h/o CAD, CVA, CA Family History of Colon Cancer: Maternal Uncle x 3   Social History: Has three children, 2 girls and one boy.  No tob/ETOH/drugs.  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 .  Married Never Smoked Daily Caffeine Use: 3-4 daily   Review of Systems       The patient complains of allergy/sinus, arthritis/joint pain, back pain, night sweats, sleeping problems, and swollen lymph glands.   The patient denies anemia, anxiety-new, blood in urine, breast changes/lumps, change in vision, confusion, cough, coughing up blood, depression-new, fainting, fatigue, fever, headaches-new, hearing problems, heart murmur, heart rhythm changes, itching, menstrual pain, muscle pains/cramps, nosebleeds, pregnancy symptoms, shortness of breath, skin rash, sore throat, swelling of feet/legs, thirst - excessive , urination - excessive , urination changes/pain, urine leakage, vision changes, and voice change.         All other systems were reviewed and were negative                                                                   Vital Signs:  Patient profile:   50 year old female Height:      66.4 inches Weight:      286 pounds BMI:     45.77 BSA:     2.34 Pulse rate:   88 / minute Pulse rhythm:   regular BP sitting:   132 / 84  (left arm) Cuff size:   large  Vitals Entered By: Ok Anis CMA (May 22, 2010 3:23 PM)  Physical Exam  Additional Exam:  On physical exam she is an obese female in mild distress from right-sided pain  skin: anicteric HEENT: normocephalic; PEERLA; no nasal or pharyngeal abnormalities neck: supple nodes: no cervical lymphadenopathy chest: clear to ausculatation and percussion heart: no murmurs, gallops, or rubs abd: soft, nontender; BS normoactive; no abdominal masses,organomegaly; there is tenderness over the right ribs at the costal margin. rectal: deferred ext: no cynanosis, clubbing, edema skeletal: no deformities neuro: oriented x 3; no focal abnormalities    Impression & Recommendations:  Problem # 1:  ABDOMINAL PAIN RIGHT UPPER QUADRANT (ICD-789.01)  This very likely is musculoskeletal pain.  Recommendations #1 in addition to local measures including heat I will begin  amitriptyline 25 mg q.h.s. for 5 days and then increasing to 50 mg q.h.s.  Orders: Colonoscopy (Colon)  Problem # 2:  CONSTIPATION (ICD-564.0) This is likely functional  and also related to her narcotic use  Recommendations #1 continue MiraLax and amitiza #2 screening colonoscopy  Risks, alternatives, and complications of the procedure, including bleeding, perforation, and possible need for surgery, were explained to the patient.  Patient's questions were answered.  Problem # 3:  SLEEP APNEA, OBSTRUCTIVE, SEVERE (ICD-327.23) Assessment: Comment Only  Patient Instructions: 1)  Copy sent to : Alvia Grove DO 2)  Your colonoscopy is scheduled on 06/05/2010 at 3:30pm 3)  The medication list was reviewed and reconciled.  All changed / newly prescribed medications were explained.  A complete medication list was provided to the patient / caregiver. 4)  Colonoscopy and Flexible Sigmoidoscopy brochure given.  5)  Conscious Sedation brochure given.  Prescriptions: MOVIPREP 100 GM  SOLR (PEG-KCL-NACL-NASULF-NA ASC-C) As per prep instructions.  #1 x 0   Entered by:   Merri Ray CMA (AAMA)   Authorized by:   Louis Meckel MD   Signed by:   Merri Ray CMA (AAMA)  on 05/22/2010   Method used:   [facility]  [address] [id number]       [address]       [place], [place]  [address]       Ph: [phone]       Fax: [phone]   RxID:   [id number] AMITRIPTYLINE HCL 25 MG TABS (AMITRIPTYLINE HCL) take one tab before bedtime for 5 days and then increase to 2 tabs before bedtime  #30 x 2   Entered and Authorized by:   Louis Meckel MD   Signed by:   Louis Meckel MD on 05/22/2010   Method used:   [facility]        [facility]  [address] [phone] (retail)       [address]       [place], [place]  [address]       Ph: [phone]       Fax: [phone]   RxID:   [id number]

## 2010-09-19 NOTE — Assessment & Plan Note (Signed)
Summary: sinus inf,tcb   Vital Signs:  Patient profile:   50 year old female Height:      66.4 inches Weight:      280.5 pounds BMI:     44.89 Temp:     98.5 degrees F oral Pulse rate:   82 / minute BP sitting:   163 / 83  (left arm) Cuff size:   large  Vitals Entered By: Gladstone Pih (October 05, 2009 10:00 AM) CC: C/O sinus, HA, fever,diarrhea,N/V Is Patient Diabetic? No Pain Assessment Patient in pain? no        CC:  C/O sinus, HA, fever, diarrhea, and N/V.  History of Present Illness: 1 week of worsening nasal congestion--at this point can barely breath through her nose. Is in school and missed Mon night, yest and today due to signs. Fever. Facial pain and tooth pain. Non productive cough.  Habits & Providers  Alcohol-Tobacco-Diet     Tobacco Status: never  Allergies: 1)  Naprosyn (Naproxen)  Physical Exam  Ears:  Tm B retracted Nose:  facial tenderness to palpation over both maxillary and frontal sinuses. Nasal mucosa swollen virtually shit w erythema and edema of turbinates.  Mouth:  Op erythematous, no exudate Neck:  shotty LAD in ant cervical nodes. Lungs:  normal breath sounds.     Impression & Recommendations:  Problem # 1:  ACUTE MAXILLARY SINUSITIS (ICD-461.0)  Her updated medication list for this problem includes:    Fluticasone Propionate 50 Mcg/act Susp (Fluticasone propionate) ..... Spray 1 spray into both nostrils once a day    Bactrim Ds 800-160 Mg Tabs (Sulfamethoxazole-trimethoprim) .Marland Kitchen... 1 by mouth two times a day generic please will also tx w 5 days prednisone as she has such impressive nasal mucosal swelling  Orders: [facility]- Est Level  3 (99213)  Complete Medication List: 1)  Fluticasone Propionate 50 Mcg/act Susp (Fluticasone propionate) .... Spray 1 spray into both nostrils once a day 2)  Meloxicam 7.5 Mg Tabs (Meloxicam) .... One to two tablets daily as needed for pain 3)  Zyrtec Allergy 10 Mg Tabs (Cetirizine hcl) .... Take 1  tablet by mouth at bedtime 4)  Miralax Powd (Polyethylene glycol 3350) .Marland Kitchen.. 17g by mouth up to two times a day as needed constipation. dispense one bottle. 5)  Meperitab 50 Mg Tabs (Meperidine hcl) .... One by mouth three times a day as needed pain 6)  Promethazine Hcl 25 Mg Tabs (Promethazine hcl) .... One by mouth three times a day as needed 7)  Fluconazole 150 Mg Tabs (Fluconazole) .... One tablet daily for 3 days 8)  Ketoconazole 2 % Crea (Ketoconazole) .... Apply two times a day to affected area one week beyond symptoms resolution. dispense one tube. 9)  Detrol 2 Mg Tabs (Tolterodine tartrate) .... Two times a day 10)  Bactrim Ds 800-160 Mg Tabs (Sulfamethoxazole-trimethoprim) .Marland Kitchen.. 1 by mouth two times a day generic please 11)  Prednisone 20 Mg Tabs (Prednisone) .... 2 tabs by mouth once daily for 5 days Prescriptions: FLUTICASONE PROPIONATE 50 MCG/ACT SUSP (FLUTICASONE PROPIONATE) Spray 1 spray into both nostrils once a day  #1 x 11   Entered and Authorized by:   Denny Levy MD   Signed by:   Denny Levy MD on 10/05/2009   Method used:   [facility]        [facility]  [address] [phone][facility] (retail)       [address] [facility]       [place]  [place], [place]  [address]       Ph: [phone]       Fax: [phone]   RxID:   [id number] PREDNISONE 20 MG TABS (PREDNISONE) 2 tabs by mouth once daily for 5 days  #10 x 0   Entered and Authorized by:   Denny Levy MD   Signed by:   Denny Levy MD on 10/05/2009   Method used:   [facility]        [facility]  [address] [phone]* (retail)       [address]       [place], [place]  [address]       Ph: [phone]       Fax: [phone]   RxID:   [id number] BACTRIM DS 800-160 MG TABS (SULFAMETHOXAZOLE-TRIMETHOPRIM) 1 by mouth two times a day generic please  #20 x 0   Entered and Authorized by:   Denny Levy MD   Signed by:   Denny Levy MD on 10/05/2009   Method used:   [facility]        [facility]  [address] [phone][facility] (retail)       [address]       [place], [place]  [address]       Ph: [phone]       Fax: [phone]   RxID:   [id number]

## 2010-09-19 NOTE — Assessment & Plan Note (Signed)
Summary: [name],df   Vital Signs:  Patient profile:   50 year old female Height:      66.4 inches Weight:      283.3 pounds BMI:     45.34 Temp:     98.6 degrees F oral Pulse rate:   89 / minute BP sitting:   121 / 76  (left arm) Cuff size:   large  Vitals Entered By: Garen Grams LPN (May 09, 2010 3:40 PM) CC: [name] Is Patient Diabetic? No Pain Assessment Patient in pain? yes     Location: chest and back   Primary Care Provider:  Alvia Grove DO  CC:  [name].  History of Present Illness: 50  yo female here for hospital f/u due to abd pain. Admitted to [facility] on 9--9--11  and d/c'd 9--12--11.Marland Kitchen  Extensive workup completed, including CT of abd and pelvis with no clear cause determined.  Etiology remains unclear, but pain is improving.  Continues to take amatiza and has started to have regular bowel movements, sometimes up to 2--3  times daily.  Stools not hard, pt does not have to strain as she did previously.  No blood in stools.  Feels less 'bloated'  with amatiza.  No GI upset, no loose stools. Pain still located in RUQ and radiates to back, but intensity has lessened.   Habits & Providers  Alcohol-Tobacco-Diet     Alcohol drinks/day: 0     Tobacco Status: never     Tobacco Counseling: not indicated; no tobacco use  Current Problems (verified): 1)  Ibs  (ICD-564.1) 2)  Abdominal Pain Right Upper Quadrant  (ICD-789.01) 3)  Leg Pain, Bilateral  (ICD-729.5) 4)  Leg Pain, Bilateral  (ICD-729.5) 5)  Sinusitis, Recurrent  (ICD-473.9) 6)  Cerumen Impaction, Bilateral  (ICD-380.4) 7)  Otitis Externa, Bilateral  (ICD-380.10) 8)  Gerd  (ICD-530.81) 9)  Jaw Pain  (ICD-526.9) 10)  Incontinence, Urge  (ICD-788.31) 11)  Intertrigo  (ICD-695.89) 12)  Back Pain, Chronic  (ICD-724.5) 13)  Sleep Apnea, Obstructive, Severe  (ICD-327.23) 14)  Hyperglycemia  (ICD-790.6) 15)  Rhinitis, Allergic Nos  (ICD-477.9) 16)  Osteoarthritis, Lower Leg  (ICD-715.96) 17)  Obesity, Nos   (ICD-278.00) 18)  Insomnia Nos  (ICD-780.52) 19)  Hypertension, Benign Systemic  (ICD-401.1) 20)  Constipation  (ICD-564.0)  Current Medications (verified): 1)  Meloxicam 7.5 Mg Tabs (Meloxicam) .... One To Two Tablets Daily As Needed For Pain 2)  Miralax   Powd (Polyethylene Glycol 3350) .Marland Kitchen.. 17g By Mouth Up To Two Times A Day As Needed Constipation. Dispense One Bottle. 3)  Meperitab 50 Mg Tabs (Meperidine Hcl) .... One By Mouth Three Times A Day As Needed Pain 4)  Promethazine Hcl 25 Mg Tabs (Promethazine Hcl) .... One By Mouth Three Times A Day As Needed 5)  Detrol 2 Mg Tabs (Tolterodine Tartrate) .... Two Times A Day 6)  Cvs Omeprazole 20 Mg Tbec (Omeprazole) .... Take 1 Pill By Mouth 7)  Fluticasone Propionate 50 Mcg/act Susp (Fluticasone Propionate) .... One Puff Each Nostril Daily 8)  Amitiza 24 Mcg Caps (Lubiprostone) .... Take 1 Pill By Mouth Tid  Allergies (verified): 1)  Naprosyn (Naproxen)  Past History:  Past Medical History: Last updated: 05/14/2008 Iron deficiency anemia resolved s/p TAH (fibroid tumors)  knee dislocation 4/05  obesity bilateral knee OA Chronic back pain s/p multiple surgeries - follow by Dr Charlett [facility] h/o post partum depression  Past Surgical History: Last updated: 02/22/2010 lamenectomy L4-5 - 01/18/2005 rt knee aspiration (Voytec) -  07/20/2005 Surgery for L3/4 herniation with nerve impingement - 01/19/2003 TAH (no BSO) - 2* to DUB, fibroids - 10/19/2002 06/2009: Exploratory laparotomy, Lysis of adhesions,  Bilateral salpingo-oophorectomy.    Family History: Last updated: 04/25/2010 Father - 71y, HTN, DM Mother - 62y, HTN, DM No h/o CAD, CVA, CA Colon ca in Uncle  Social History: Last updated: 04/28/2010 Has three children, 2 girls and one boy.  No tob/ETOH/drugs.  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 .  Married Never Smoked  Risk Factors: Alcohol Use: 0  (05/09/2010)  Risk Factors: Smoking Status: never (05/09/2010)  Family History: Reviewed history from 04/25/2010 and no changes required. Father - 18y, HTN, DM Mother - 70y, HTN, DM No h/o CAD, CVA, CA Colon ca in Uncle  Social History: Reviewed history from 04/28/2010 and no changes required. Has three children, 2 girls and one boy.  No tob/ETOH/drugs.  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 .  Married Never Smoked  Review of Systems       The patient complains of abdominal pain.  The patient denies fever, weight loss, weight gain, chest pain, syncope, dyspnea on exertion, headaches, hemoptysis, melena, hematochezia, severe indigestion/heartburn, hematuria, incontinence, and abnormal bleeding.    Physical Exam  General:  vs reviewed, alert, well-developed, well-nourished, and well-hydrated.   Neck:  supple, full ROM, and no masses.  no thyroid nodules or tenderness.   Lungs:  normal respiratory effort, normal breath sounds, no crackles, and no wheezes Heart:  normal rate, regular rhythm, no murmur, NOT tachycardic Abdomen:  soft, normal bowel sounds, no distention, no masses, no abdominal hernia, no hepatomegaly, and RUQ tenderness.   Extremities:  trace left pedal edema and trace right pedal edema,  Neurologic:  alert & oriented X3.   Cervical Nodes:  No lymphadenopathy noted   Impression & Recommendations:  Problem # 1:  ABDOMINAL PAIN RIGHT UPPER QUADRANT (ICD-789.01) Assessment Improved  recent hospital admission for 3  days. No clear etiology determined., but pain is improving. Having regular BM 's with amatiza.  Will continue amitiza.  see pt instructions Her updated medication list for this problem includes:    Amitiza 24 Mcg Caps (Lubiprostone) .Marland Kitchen... Take 1 pill by mouth tid  Orders: [facility]- Est  Level 4 (78469)  Problem # 2:  IBS (ICD-564.1) Assessment: Improved as # 1 Colace, Miralaax PRN  Problem  # 3:  HYPERTENSION, BENIGN SYSTEMIC (ICD-401.1)  given script for lisinopril upon d/c at hospital, intention to replace HCTZ, however pt requestng to continue HCTZ and does not want to switch to ACEI. Has tolerated HCTZ well, so will continue with that per pt preference  Orders: [facility]- Est  Level 4 ([address]52)  Complete Medication List: 1)  Meloxicam 7.5 Mg Tabs (Meloxicam) .... One to two tablets daily as needed for pain 2)  Miralax Powd (Polyethylene glycol 3350) .Marland Kitchen.. 17g by mouth up to two times a day as needed constipation. dispense one bottle. 3)  Meperitab 50 Mg Tabs (Meperidine hcl) .... One by mouth three times a day as needed pain 4)  Promethazine Hcl 25 Mg Tabs (Promethazine hcl) .... One by mouth three times a day as needed 5)  Detrol 2 Mg Tabs (Tolterodine tartrate) .... Two times a day 6)  Cvs Omeprazole 20 Mg Tbec (Omeprazole) .... Take 1 pill by mouth 7)  Fluticasone Propionate 50 Mcg/act  Susp (Fluticasone propionate) .... One puff each nostril daily 8)  Amitiza 24 Mcg Caps (Lubiprostone) .... Take 1 pill by mouth tid  Patient Instructions: 1)  Great to see you today! 2)  I'm so glad you are feeling better. 3)  I think it's a great idea to start exercise, swimming would beagood choice. 4)  Continue taking Amatiza and HCTZ.  You can stop lisinopril. Prescriptions: MELOXICAM 7.5 MG TABS (MELOXICAM) one to two tablets daily as needed for pain  #60 x 1   Entered and Authorized by:   Alvia Grove DO   Signed by:   Alvia Grove DO on 05/09/2010   Method used:   [facility]        [facility]  [address] [phone]* (retail)       [address]       [place], [place]  [address]       Ph: [phone]       Fax: [phone]   RxID:   [id number]

## 2010-09-19 NOTE — Letter (Signed)
Summary: Out of Work  [facility]  [address]   [place], [place] [address]   Phone: [phone]  Fax: [phone]    August 25, 2009   Employee:  TONNA PALAZZI    To Whom It May Concern:   For Medical reasons, please excuse the above named employee from work for the following dates:  Start:   08-29-2009  End:   09-20-2009  If you need additional information, please feel free to contact our office.         Sincerely,    Alvia Grove DO

## 2010-09-19 NOTE — Letter (Signed)
Summary: Out of Work  [facility]  [address]   [place], [place] [address]   Phone: [phone]  Fax: [phone]    October 18, 2009   Employee:  ADELL KOVAL    To Whom It May Concern:   For Medical reasons, please excuse the above named employee from work for the following dates:  Start:   10-12-09  End:   10-16-09  If you need additional information, please feel free to contact our office.         Sincerely,    Alvia Grove DO

## 2010-09-19 NOTE — Progress Notes (Signed)
Summary: Emergency Line Call   Patient call Emergency Line with concern of jaw numbness all day. She states that she has had the flu since Wed and was recently treated for a sinus infection. She has been taking Nyquil and wonders if the numbness was caused by the medication. She denies trouble breathing or swallowing. Speech normal. Whole jaw numb. No focal numbness/droop. She is trying to drink plenty of fluids - gatorade. Gave patient RED FLAGs for going to [facility] over the weekend. Otherwise, she will call the clinic for a work-in appt on Monday. Helane Rima DO  October 15, 2009 1:42 AM

## 2010-09-19 NOTE — Miscellaneous (Signed)
Summary: Disability paperwork  pts dropped off form to be completed, placed on triage desk for any clinical info to be completed. [name]  May 15, 2010 2:27 PM    forms to pcp.Golden Circle RN  May 15, 2010 2:48 PM  Completed and placed at front

## 2010-09-19 NOTE — Letter (Signed)
Summary: Out of Work  [facility]  [address]   [place], [place] [address]   Phone: [phone]  Fax: [phone]    May 09, 2010   Employee:  [name]    To Whom It May Concern:   For Medical reasons, please excuse the above named employee from work for the following dates:  Start:   September 20th 2011  End:   May 29 2010  If you need additional information, please feel free to contact our office.         Sincerely,    Alvia Grove DO

## 2010-09-19 NOTE — Progress Notes (Signed)
Summary: phn msg  Phone Note Call from Patient Call back at [phone]   Caller: Patient Summary of Call: Pt checking on status of FMLA paperwork. Initial call taken by: Clydell Hakim,  May 15, 2010 10:35 AM    Returned call, advised pt hat paperwork is complete and at front

## 2010-09-19 NOTE — Procedures (Signed)
Summary: Colonoscopy  Patient: [name] Note: All result statuses are Final unless otherwise noted.  Tests: (1) Colonoscopy (COL)   COL Colonoscopy           DONE     [facility]     [address]     [place], [place]  [address]           COLONOSCOPY PROCEDURE REPORT           PATIENT:  [name], [name]  MR#:  [id number]     BIRTHDATE:  [birth year], 49 yrs. old  GENDER:  female           ENDOSCOPIST:  Barbette Hair. Arlyce Dice, MD     Referred by:           PROCEDURE DATE:  06/05/2010     PROCEDURE:  Diagnostic Colonoscopy     ASA CLASS:  Class II     INDICATIONS:  1) constipation           MEDICATIONS:   Fentanyl 50 mcg IV, Versed 7 mg IV, Benadryl 25 mg     IV, Zofran 4 mg IV           DESCRIPTION OF PROCEDURE:   After the risks benefits and     alternatives of the procedure were thoroughly explained, informed     consent was obtained.  Digital rectal exam was performed and     revealed no abnormalities.   The LB 180AL [id number] endoscope was     introduced through the anus and advanced to the cecum, which was     identified by the ileocecal valve, without limitations.  The     quality of the prep was excellent, using MiraLax.  The instrument     was then slowly withdrawn as the colon was fully examined.     <<PROCEDUREIMAGES>>           FINDINGS:  A normal appearing cecum, ileocecal valve, and     appendiceal orifice were identified. The ascending, hepatic     flexure, transverse, splenic flexure, descending, sigmoid colon,     and rectum appeared unremarkable (see image1, image4, image8,     image11, image12, image14, image17, and image18).   Retroflexed     views in the rectum revealed no abnormalities.    The time to     cecum =  3.75  minutes. The scope was then withdrawn (time =  7.75     min) from the patient and the procedure completed.           COMPLICATIONS:  None           ENDOSCOPIC IMPRESSION:     1) Normal colon     RECOMMENDATIONS:     1)  Continue current medications     2) call office next 1-3 days to schedule followup visit in 1     month           REPEAT EXAM:  No           ______________________________     Barbette Hair. Arlyce Dice, MD           CC: Alvia Grove DO           n.     eSIGNED:   Barbette Hair. Vanilla Heatherington at 06/05/2010 04:05 PM           [name], [id number]  Note: An exclamation mark (!) indicates a result that was not dispersed into  the flowsheet. Document Creation Date: 06/05/2010 4:07 PM _______________________________________________________________________  (1) Order result status: Final Collection or observation date-time: 06/05/2010 15:57 Requested date-time:  Receipt date-time:  Reported date-time:  Referring Physician:   Ordering Physician: Melvia Heaps [phone]) Specimen Source:  Source: Launa Grill Order Number: [id number] Lab site:

## 2010-09-19 NOTE — Assessment & Plan Note (Signed)
Summary: acid reflux,tcb   Vital Signs:  Patient profile:   50 year old female Height:      66.4 inches Weight:      277 pounds BMI:     44.33 BSA:     2.31 Temp:     98.2 degrees F Pulse rate:   85 / minute BP sitting:   134 / 79  Vitals Entered By: Jone Baseman CMA (October 18, 2009 8:48 AM) CC: acid reflux Is Patient Diabetic? No Pain Assessment Patient in pain? no        Primary Care Provider:  Alvia Grove DO  CC:  acid reflux.  History of Present Illness: 50 y/o female with multiple concerns today. 1. Jaw tingling/numbeness: Bilateral, new onset about 1 week ago.  No trauma, no known injury. Describes it as constant steady numbness/tingling, denies pain, no radiation.  Denies chest pain, SOB, dizziness or palpataions.  Nothing makes it better or worse.  Not impacting by mouth intake.  Able to chew and swallow without difficulty.  Denies this ever happening before, denies hx of TMJ, teeth grinding or clenching. 2. Acid reflux: Pt has had symptoms before, able to control with OTC meds as needed.  Feels it is worsening over the last few months.  Denies chest pain with these episodes. Occurs after heavy/spicy meals.  Endorses burning pain in throat about 30 minutes after meal time.  Does not occur after every meal, relieved by antacids.   3. Sinus infection: Seen on 10-05-09 and dx with sinus infection.  Completed a 5 day course of Bactrim and prednisone, felt inital improvement, but over the last week symptoms have returned.  Endorses nasal congestion and pain, as well as facial pain and pressure.  Denies recent fevers, sweats or chills.   Habits & Providers  Alcohol-Tobacco-Diet     Tobacco Status: never  Current Problems (verified): 1)  Jaw Pain  (ICD-526.9) 2)  Acute Maxillary Sinusitis  (ICD-461.0) 3)  Incontinence, Urge  (ICD-788.31) 4)  Pelvic Pain, Acute  (ICD-789.09) 5)  Dysuria  (ICD-788.1) 6)  Intertrigo  (ICD-695.89) 7)  Back Pain, Chronic   (ICD-724.5) 8)  Screening For Lipoid Disorders  (ICD-V77.91) 9)  Sleep Apnea, Obstructive, Severe  (ICD-327.23) 10)  Hyperglycemia  (ICD-790.6) 11)  Rhinitis, Allergic Nos  (ICD-477.9) 12)  Osteoarthritis, Lower Leg  (ICD-715.96) 13)  Obesity, Nos  (ICD-278.00) 14)  Insomnia Nos  (ICD-780.52) 15)  Hypertension, Benign Systemic  (ICD-401.1) 16)  Constipation  (ICD-564.0)  Current Medications (verified): 1)  Fluticasone Propionate 50 Mcg/act Susp (Fluticasone Propionate) .... Spray 1 Spray Into Both Nostrils Once A Day 2)  Meloxicam 7.5 Mg Tabs (Meloxicam) .... One To Two Tablets Daily As Needed For Pain 3)  Zyrtec Allergy 10 Mg Tabs (Cetirizine Hcl) .... Take 1 Tablet By Mouth At Bedtime 4)  Miralax   Powd (Polyethylene Glycol 3350) .Marland Kitchen.. 17g By Mouth Up To Two Times A Day As Needed Constipation. Dispense One Bottle. 5)  Meperitab 50 Mg Tabs (Meperidine Hcl) .... One By Mouth Three Times A Day As Needed Pain 6)  Promethazine Hcl 25 Mg Tabs (Promethazine Hcl) .... One By Mouth Three Times A Day As Needed 7)  Fluconazole 150 Mg Tabs (Fluconazole) .... One Tablet Daily For 3 Days 8)  Ketoconazole 2 % Crea (Ketoconazole) .... Apply Two Times A Day To Affected Area One Week Beyond Symptoms Resolution. Dispense One Tube. 9)  Detrol 2 Mg Tabs (Tolterodine Tartrate) .... Two Times A Day 10)  Bactrim  Ds 800-160 Mg Tabs (Sulfamethoxazole-Trimethoprim) .Marland Kitchen.. 1 By Mouth Two Times A Day Generic Please 11)  Prednisone 20 Mg Tabs (Prednisone) .... 2 Tabs By Mouth Once Daily For 5 Days 12)  [facility] Omeprazole 20 Mg Tbec (Omeprazole) .... 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  1 occasional sexual partner.  No tob/ETOH/drugs.  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 Son has become a behavior problem and stresses her signficantly.   Risk Factors: Smoking Status: never (10/18/2009)  Review of Systems  The patient denies anorexia, fever, vision loss, decreased hearing, chest pain, syncope, dyspnea on exertion, peripheral edema, headaches, hemoptysis, abdominal pain, severe indigestion/heartburn, incontinence, muscle weakness, and transient blindness.         See HPI for full ROS  Physical Exam  General:  VS reviewed alert, well-developed, well-nourished, well-hydrated, and overweight-appearing.   Head:  normocephalic and atraumatic.   Eyes:  vision grossly intact.   Nose:  bilaterak mucosal erythema and mucosal edema.  No tenderness at sinuses bilaterally Mouth:  good dentition, pharynx pink and moist, no erythema, no exudates, no postnasal drip, and no lesions.  Jaw: No click, pop or pain at TMJ bilaterally,  non tender to palp, no deformities, sensation intact Neck:  No deformities, masses, or tenderness noted. Lungs:  Normal respiratory effort, chest expands symmetrically. Lungs are clear to auscultation, no crackles or wheezes. Heart:  Normal rate and regular rhythm. S1 and S2 normal without gallop, murmur, click, rub or other extra sounds. Msk:  normal ROM, no joint tenderness, and no joint swelling.   Extremities:  No clubbing, cyanosis, edema, or deformity noted with normal full range of motion of all joints.   Skin:   turgor normal, color normal, and no rashes.     Impression & Recommendations:  Problem # 1:  JAW PAIN (ICD-526.9) Assessment New Unkclear etiology.  Will get EKG now due to concern for cardiac cause, seems doubtful as pt has no cardiac hx nor concerning findings on exam.  Will get CBC and ESR, to look for acute infection.   Numbeness could simply be due to chronic sinus infections, TMJ, teeth grinding or clenching.   Orders: 12 Lead EKG (12 Lead EKG) Comp Met-[facility] (93267-12458) CBC w/Diff-[facility] (09983) Sed Rate (ESR)-[facility] (85651) [facility]- Est  Level 4 (38250)  Problem # 2:  ACUTE MAXILLARY SINUSITIS (ICD-461.0) Assessment: Unchanged Unsure if this is repeat infection or continuation of prior infection.  Pt endorses good response with Bactirm, will repeat Abx with longer course duration.  Low threshold for imaging if no improvement within 1 week.  Her updated medication list for this problem includes:    Fluticasone Propionate 50 Mcg/act Susp (Fluticasone propionate) ..... Spray 1 spray into both nostrils once a day  Bactrim Ds 800-160 Mg Tabs (Sulfamethoxazole-trimethoprim) .Marland Kitchen... 1 by mouth two times a day generic please  Orders: Comp Met-[facility] (41740-81448) CBC w/Diff-[facility] (18563) Sed Rate (ESR)-[facility] (85651) [facility]- Est  Level 4 (14970)  Problem # 3:  GERD (ICD-530.81) Assessment: New Per pt report has had mild symptoms in the past, but now is worsening.  Has used OTC's in the past to control, but the need for them is increasing in frequency.  Will start with daily Omeprazole and monitor response.  Discussed foods that increase incidence (coffee, alcholol) as well as importance of sitting up right 30 minutes post-meal, encouraged lighter, more frequent meals thruout the day.  Her updated medication list for this problem includes:    [facility]vs Omeprazole 20 Mg Tbec (Omeprazole) .Marland Kitchen... Take 1 pill by mouth  Complete Medication List: 1)  Fluticasone Propionate 50 Mcg/act Susp (Fluticasone propionate)  .... Spray 1 spray into both nostrils once a day 2)  Meloxicam 7.5 Mg Tabs (Meloxicam) .... One to two tablets daily as needed for pain 3)  Zyrtec Allergy 10 Mg Tabs (Cetirizine hcl) .... Take 1 tablet by mouth at bedtime 4)  Miralax Powd (Polyethylene glycol 3350) .Marland Kitchen.. 17g by mouth up to two times a day as needed constipation. dispense one bottle. 5)  Meperitab 50 Mg Tabs (Meperidine hcl) .... One by mouth three times a day as needed pain 6)  Promethazine Hcl 25 Mg Tabs (Promethazine hcl) .... One by mouth three times a day as needed 7)  Fluconazole 150 Mg Tabs (Fluconazole) .... One tablet daily for 3 days 8)  Ketoconazole 2 % Crea (Ketoconazole) .... Apply two times a day to affected area one week beyond symptoms resolution. dispense one tube. 9)  Detrol 2 Mg Tabs (Tolterodine tartrate) .... Two times a day 10)  Bactrim Ds 800-160 Mg Tabs (Sulfamethoxazole-trimethoprim) .Marland Kitchen.. 1 by mouth two times a day generic please 11)  Prednisone 20 Mg Tabs (Prednisone) .... 2 tabs by mouth once daily for 5 days 12)  [facility]vs Omeprazole 20 Mg Tbec (Omeprazole) .... Take 1 pill by mouth  Patient Instructions: 1)  Nice to see you today! 2)  Your EKG looked perfect. 3)  I am going to get some blood work today and I will call you if anything is abnormal. 4)  I sent a script for Bactrim to the Pharmacy, if you are no better in 1 week, please call me and I will schedule you to get some imaging done of your sinuses. 5)  I also sent Omeprazole to the pharmacy, take 1 pill by mouth everyday for acid reflux. Prescriptions: [facility] OMEPRAZOLE 20 MG TBEC (OMEPRAZOLE) take 1 pill by mouth  #30 x 5   Entered and Authorized by:   Alvia Grove DO   Signed by:   Alvia Grove DO on 10/18/2009   Method used:   [facility] to        [facility]  [address] [phone][facility] (retail)       [address]       [place], [place]  [address]       Ph: [phone]       Fax: [phone]   RxID:    [id number] BACTRIM DS 800-160 MG TABS (SULFAMETHOXAZOLE-TRIMETHOPRIM) 1 by mouth two times a day generic please  #14 x 0   Entered and Authorized by:   Alvia Grove DO   Signed by:   Alvia Grove DO on 10/18/2009   Method used:  [facility]        [facility]  [address] [facility] ([facility]       [address]       [place], [place]  [address]       Ph: [phone]       Fax: [phone]   RxID:   [id number]   Laboratory Results   Blood Tests   Date/Time Received: October 18, 2009 9:47 AM  Date/Time Reported: October 18, 2009 10:59 AM   SED rate: 10  mm/hr  Comments: ...............test performed by......Marland KitchenBonnie A. [place]az[place], MLS (ASCP)cm

## 2010-09-19 NOTE — Letter (Signed)
Summary: Out of School  [facility]  [address]   [place], [place] [address]   Phone: [phone]  Fax: [phone]    October 05, 2009   Student:  [name]    To Whom It May Concern:   For Medical reasons, please excuse the above named student from school for the following dates:  Start:   October 03, 2009  End:    Plans to return to school Thursdat 10/06/2009  If you need additional information, please feel free to contact our office.   Sincerely,    Denny Levy MD    ****This is a legal document and cannot be tampered with.  Schools are authorized to verify all information and to do so accordingly.

## 2010-09-19 NOTE — Letter (Signed)
Summary: Out of Work  [facility]  [address]   [place], [place] [address]   Phone: [phone]  Fax: [phone]    October 19, 2009   Employee:  AALYSSA ELDERKIN    To Whom It May Concern:   For Medical reasons, please excuse the above named employee from work for the following dates:  Start:   08-29-2009  End:   09-20-2009  Ms. Soots was recovering from recent surgery and under my direct medical care.  She will be released to resume work as of 09-20-2009, pending no complications. If you need additional information, please feel free to contact our office.         Sincerely,    Alvia Grove DO

## 2010-09-19 NOTE — Letter (Signed)
Summary: FMLA  FMLA   Imported By: Clydell Hakim 05/17/2010 10:52:15  _____________________________________________________________________  External Attachment:    Type:   Image     Comment:   External Document

## 2010-09-19 NOTE — Assessment & Plan Note (Signed)
Summary: f/u,df   Vital Signs:  Patient profile:   50 year old [name] Height:      66.4 inches Weight:      282 pounds BMI:     45.13 BSA:     2.33 Temp:     98.1 degrees F Pulse rate:   57 / minute BP sitting:   139 / 78  Vitals Entered By: Jone Baseman CMA (August 25, 2009 2:29 PM) CC: f/u Is Patient Diabetic? No Pain Assessment Patient in pain? yes     Location: back Intensity: 6   CC:  f/u.  History of Present Illness: 50 yo [name] here for f/u from complete hysterectomy due to pelvic mass.  C/o of left lower back pain that strated about 3 weeks ago.  Pain worse with weight bearing.  Admitts to some tingling and parasthestia running down left leg.  Last BM was a few days ago. No red flag s/s. Pain began a few weeks post-op.  Has had no complications from surgery and has released from gyn onc back to our practice.  2. urinary frequency:  Pt c/o pain and burning with urination.  had previous UTI last month. 3. Constipation: chronic.  Pt has been on narcotics since surgery and BM's are few and painful.  Has been taking OTC stool softener.  Habits & Providers  Alcohol-Tobacco-Diet     Tobacco Status: never  Current Problems (verified): 1)  Incontinence, Urge  (ICD-788.31) 2)  Pelvic Pain, Acute  (ICD-789.09) 3)  Dysuria  (ICD-788.1) 4)  Intertrigo  (ICD-695.89) 5)  Back Pain, Chronic  (ICD-724.5) 6)  Screening For Lipoid Disorders  (ICD-V77.91) 7)  Sleep Apnea, Obstructive, Severe  (ICD-327.23) 8)  Hyperglycemia  (ICD-790.6) 9)  Rhinitis, Allergic Nos  (ICD-477.9) 10)  Osteoarthritis, Lower Leg  (ICD-715.96) 11)  Obesity, Nos  (ICD-278.00) 12)  Insomnia Nos  (ICD-780.52) 13)  Hypertension, Benign Systemic  (ICD-401.1) 14)  Constipation  (ICD-564.0)  Current Medications (verified): 1)  Fluticasone Propionate 50 Mcg/act Susp (Fluticasone Propionate) .... Spray 1 Spray Into Both Nostrils Once A Day 2)  Meloxicam 7.5 Mg Tabs (Meloxicam) .... One To Two Tablets  Daily As Needed For Pain 3)  Zyrtec Allergy 10 Mg Tabs (Cetirizine Hcl) .... Take 1 Tablet By Mouth At Bedtime 4)  Miralax   Powd (Polyethylene Glycol 3350) .Marland Kitchen.. 17g By Mouth Up To Two Times A Day As Needed Constipation. Dispense One Bottle. 5)  Meperitab 50 Mg Tabs (Meperidine Hcl) .... One By Mouth Three Times A Day As Needed Pain 6)  Promethazine Hcl 25 Mg Tabs (Promethazine Hcl) .... One By Mouth Three Times A Day As Needed 7)  Fluconazole 150 Mg Tabs (Fluconazole) .... One Tablet Daily For 3 Days 8)  Ketoconazole 2 % Crea (Ketoconazole) .... Apply Two Times A Day To Affected Area One Week Beyond Symptoms Resolution. Dispense One Tube. 9)  Detrol 2 Mg Tabs (Tolterodine Tartrate) .... Two Times A Day 10)  Sorbitol Anne % Soln (Sorbitol) .... Take 30 Ml As Needed For Constipation 11)  Flexeril 10 Mg Tabs (Cyclobenzaprine Hcl) .... Take 1 Pill Every 12 Hours For 2 Weeks 12)  Percocet 10-325 Mg Tabs (Oxycodone-Acetaminophen) .... Take 1 Pill Every 6 Hours As Needed For Pain  Allergies (verified): 1)  Naprosyn (Naproxen)  Past History:  Past Medical History: Last updated: 05/14/2008 Iron deficiency anemia resolved s/p TAH (fibroid tumors)  knee dislocation 4/05  obesity bilateral knee OA Chronic back pain s/p multiple surgeries - follow by  Dr Charlett Blake [facility] h/o post partum depression  Past Surgical History: Last updated: 07/30/2007 lamenectomy L4-5 - 01/18/2005 rt knee aspiration (Voytec) - 07/20/2005 Surgery for L3/4 herniation with nerve impingement - 01/19/2003 TAH (no BSO) - 2* to DUB, fibroids - 10/19/2002  Family History: Last updated: 10/29/2006 Father - 71y, HTN, DM Mother - 46y, HTN, DM No h/o CAD, CVA, CA  Social History: Last updated: 08/30/2008 Has three children, 2 girls and one boy.  1 occasional sexual partner.  No tob/ETOH/drugs.  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 Son has become a behavior problem and stresses her  signficantly.   Risk Factors: Smoking Status: never (08/25/2009)  Physical Exam  General:  alert, well-developed, well-nourished, well-hydrated, and overweight-appearing.   Lungs:  Normal respiratory effort, chest expands symmetrically. Lungs are clear to auscultation, no crackles or wheezes. Heart:  Normal rate and regular rhythm. S1 and S2 normal without gallop, murmur, click, rub or other extra sounds. Abdomen:  soft, normal bowel sounds, no distention, no masses, and abdominal scar(s).   Extremities:  No clubbing, cyanosis, edema, or deformity noted with normal full range of motion of all joints.   Psych:  Oriented X3, good eye contact, and not anxious appearing.     Impression & Recommendations:  Problem # 1:  BACK PAIN, CHRONIC (ICD-724.5) Assessment New Will start on flexeril at night for back pain.  Also will give refill of percocet for pain.  Will set up PT and f/u with pain in 4-6 wks.  Pt to take meds at night when not working to make sure they do not impair her ability to function.  Will write out of work until f/u. Her updated medication list for this problem includes:    Meloxicam 7.5 Mg Tabs (Meloxicam) ..... One to two tablets daily as needed for pain    Meperitab 50 Mg Tabs (Meperidine hcl) ..... One by mouth three times a day as needed pain    Flexeril 10 Mg Tabs (Cyclobenzaprine hcl) .Marland Kitchen... Take 1 pill every 12 hours for 2 weeks    Percocet 10-325 Mg Tabs (Oxycodone-acetaminophen) .Marland Kitchen... Take 1 pill every 6 hours as needed for pain  Orders: Physical Therapy Referral (PT) [facility]- Est  Level 4 (88416)  Problem # 2:  DYSURIA (ICD-788.1) Will get UA and culture.  will tx if + Her updated medication list for this problem includes:    Detrol 2 Mg Tabs (Tolterodine tartrate) .Marland Kitchen..Marland Kitchen Two times a day  Orders: Urinalysis-[facility] (00000) Urine Culture-[facility] ([address]-16010) [facility]- Est  Level 4 (93235)  Problem # 3:  CONSTIPATION (ICD-564.0) Assessment: Deteriorated Constipation  worsening due to narcotics.  Pt has tried sorbitol with much relief in the past.  will continue that for now and see if it makes a difference.  To continue with miralax as well. Her updated medication list for this problem includes:    Miralax Powd (Polyethylene glycol 3350) .Marland KitchenMarland KitchenMarland KitchenMarland Kitchen 17g by mouth up to two times a day as needed constipation. dispense one bottle.    Sorbitol Anne % Soln (Sorbitol) .Marland Kitchen... Take 30 ml as needed for constipation  Complete Medication List: 1)  Fluticasone Propionate 50 Mcg/act Susp (Fluticasone propionate) .... Spray 1 spray into both nostrils once a day 2)  Meloxicam 7.5 Mg Tabs (Meloxicam) .... One to two tablets daily as needed for pain 3)  Zyrtec Allergy 10 Mg Tabs (Cetirizine hcl) .... Take 1 tablet by mouth at bedtime 4)  Miralax  Powd (Polyethylene glycol 3350) .Marland Kitchen.. 17g by mouth up to two times a day as needed constipation. dispense one bottle. 5)  Meperitab 50 Mg Tabs (Meperidine hcl) .... One by mouth three times a day as needed pain 6)  Promethazine Hcl 25 Mg Tabs (Promethazine hcl) .... One by mouth three times a day as needed 7)  Fluconazole 150 Mg Tabs (Fluconazole) .... One tablet daily for 3 days 8)  Ketoconazole 2 % Crea (Ketoconazole) .... Apply two times a day to affected area one week beyond symptoms resolution. dispense one tube. 9)  Detrol 2 Mg Tabs (Tolterodine tartrate) .... Two times a day 10)  Sorbitol Anne % Soln (Sorbitol) .... Take 30 ml as needed for constipation 11)  Flexeril 10 Mg Tabs (Cyclobenzaprine hcl) .... Take 1 pill every 12 hours for 2 weeks 12)  Percocet 10-325 Mg Tabs (Oxycodone-acetaminophen) .... Take 1 pill every 6 hours as needed for pain  Patient Instructions: 1)  Great to see you!!  You are looking great! 2)  I will call you if you have a urinary infection and will let you know if you need antibiotics. 3)  Take 1 Percocet every 6 hours as you need for pain. 4)  I sent your sorbitol and flexaril scripts to [facility]. 5)  Start taking  flexaril every 12 hours for 2 weeks, take it at night the first time in case it makes you sleepy. 6)  I will send you to physical therapy for your back pain, they will call you to schedule an appointment.  7)  Call me if you have any problems. 8)  Schedule a follow up to see me in 4 weeks. Prescriptions: PERCOCET 10-325 MG TABS (OXYCODONE-ACETAMINOPHEN) Take 1 pill every 6 hours as needed for pain  #40 x 0   Entered and Authorized by:   Alvia Grove DO   Signed by:   Alvia Grove DO on 08/25/2009   Method used:   Print then Give to Patient   RxID:   [id number] FLEXERIL 10 MG TABS (CYCLOBENZAPRINE HCL) Take 1 pill every 12 hours for 2 weeks  #30 x 2   Entered and Authorized by:   Alvia Grove DO   Signed by:   Alvia Grove DO on 08/25/2009   Method used:   Electronically to        [facility]  [address] [id number] ([facility])       [address]       [place], [place]  [address]       Ph: [phone]       Fax: [phone]   RxID:   [id number] SORBITOL Anne % SOLN (SORBITOL) Take 30 mL as needed for constipation  #210 mL x 3   Entered and Authorized by:   Alvia Grove DO   Signed by:   Alvia Grove DO on 08/25/2009   Method used:   [facility]ically to        [facility] [id number]       [address]       [place], [place]  [address]       Ph: [phone]       Fax: [phone]   RxID:   [id number]   Laboratory Results   Urine Tests  Date/Time Received: August 25, 2009 3:31PM  Date/Time Reported: August 25, 2009 3:45 PM    Routine Urinalysis  Color: yellow Appearance: Clear Glucose: negative   (Normal Range: Negative) Bilirubin: negative   (Normal Range: Negative) Ketone: negative   (Normal Range: Negative) Spec. Gravity: >=1.030   (Normal Range: 1.003-1.035) Blood: negative   (Normal Range: Negative) pH: 5.5   (Normal Range: 5.0-8.0) Protein: negative   (Normal Range:  Negative) Urobilinogen: 0.2   (Normal Range: 0-1) Nitrite: negative   (Normal Range: Negative) Leukocyte Esterace: negative   (Normal Range: Negative)    Comments: urine sent for culture  ...........test performed by...........Marland KitchenTerese Door, CMA

## 2010-09-19 NOTE — Assessment & Plan Note (Signed)
Summary: side pain,df   Vital Signs:  Patient profile:   50 year old female Height:      66.4 inches Weight:      282 pounds Temp:     98.8 degrees F oral Pulse rate:   86 / minute BP sitting:   142 / 80  (left arm) Cuff size:   large  Vitals Entered By: Dennison Nancy RN (April 18, 2010 2:32 PM) CC: [place] for pain under right breast Is Patient Diabetic? No Pain Assessment Patient in pain? yes     Location: right upper side Intensity: 6 Onset of pain  one month ago   Primary Care Provider:  Alvia Grove DO  CC:  [place] for pain under right breast.  History of Present Illness: 50 yo female here with RUQ pain x 1 month.  Pain is located in her RUQ and radiates to her back.  Pain is intermittent, occurs with PO intake at times, but at other times pain is not associated with anything.  Pt does endorse some GERD symptoms associated with pain and has been taking omeprazole with some relief. No billious vomitting, no diarrhea.  Last BM today, no blood in stools.  No nausea or vomitting.  Never had similar pain.  Habits & Providers  Alcohol-Tobacco-Diet     Alcohol drinks/day: 0     Tobacco Status: never     Tobacco Counseling: not indicated; no tobacco use  Current Problems (verified): 1)  Abdominal Pain Right Upper Quadrant  (ICD-789.01) 2)  Leg Pain, Bilateral  (ICD-729.5) 3)  Leg Pain, Bilateral  (ICD-729.5) 4)  Sinusitis, Recurrent  (ICD-473.9) 5)  Cerumen Impaction, Bilateral  (ICD-380.4) 6)  Otitis Externa, Bilateral  (ICD-380.10) 7)  Gerd  (ICD-530.81) 8)  Jaw Pain  (ICD-526.9) 9)  Incontinence, Urge  (ICD-788.31) 10)  Intertrigo  (ICD-695.89) 11)  Back Pain, Chronic  (ICD-724.5) 12)  Sleep Apnea, Obstructive, Severe  (ICD-327.23) 13)  Hyperglycemia  (ICD-790.6) 14)  Rhinitis, Allergic Nos  (ICD-477.9) 15)  Osteoarthritis, Lower Leg  (ICD-715.96) 16)  Obesity, Nos  (ICD-278.00) 17)  Insomnia Nos  (ICD-780.52) 18)  Hypertension, Benign Systemic  (ICD-401.1) 19)   Constipation  (ICD-564.0)  Current Medications (verified): 1)  Meloxicam 7.5 Mg Tabs (Meloxicam) .... One To Two Tablets Daily As Needed For Pain 2)  Miralax   Powd (Polyethylene Glycol 3350) .Marland Kitchen.. 17g By Mouth Up To Two Times A Day As Needed Constipation. Dispense One Bottle. 3)  Meperitab 50 Mg Tabs (Meperidine Hcl) .... One By Mouth Three Times A Day As Needed Pain 4)  Promethazine Hcl 25 Mg Tabs (Promethazine Hcl) .... One By Mouth Three Times A Day As Needed 5)  Detrol 2 Mg Tabs (Tolterodine Tartrate) .... Two Times A Day 6)  [facility] Omeprazole 20 Mg Tbec (Omeprazole) .... Take 1 Pill By Mouth 7)  Fluticasone Propionate 50 Mcg/act Susp (Fluticasone Propionate) .... One Puff Each Nostril Daily  Allergies (verified): 1)  Naprosyn (Naproxen)  Past History:  Past Medical History: Last updated: 05/14/2008 Iron deficiency anemia resolved s/p TAH (fibroid tumors)  knee dislocation 4/05  obesity bilateral knee OA Chronic back pain s/p multiple surgeries - follow by Dr Charlett Blake [facility] h/o post partum depression  Past Surgical History: Last updated: 02/22/2010 lamenectomy L4-5 - 01/18/2005 rt knee aspiration (Voytec) - 07/20/2005 Surgery for L3/4 herniation with nerve impingement - 01/19/2003 TAH (no BSO) - 2* to DUB, fibroids - 10/19/2002 06/2009: Exploratory laparotomy, Lysis of adhesions,  Bilateral salpingo-oophorectomy.    Family  History: Last updated: 10/29/2006 Father - 71y, HTN, DM Mother - 66y, HTN, DM No h/o CAD, CVA, CA  Social History: Last updated: 08/30/2008 Has three children, 2 girls and one boy.  1 occasional sexual partner.  No tob/ETOH/drugs.  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 Son has become a behavior problem and stresses her signficantly.   Risk Factors: Alcohol Use: 0 (04/18/2010)  Risk Factors: Smoking Status: never (04/18/2010)  Review of Systems       The patient complains of abdominal pain.  The patient  denies fever, weight loss, weight gain, hemoptysis, melena, hematochezia, hematuria, and abnormal bleeding.    Physical Exam  General:  Vs reivewed, hypertensive, alert, well-developed, well-nourished, and well-hydrated.  Uncomfortable appearing Lungs:  normal respiratory effort, normal breath sounds, no crackles, and no wheezes Heart:  normal rate, regular rhythm, no murmur, NOT tachycardic Abdomen:  soft, normal bowel sounds, no distention, no rebound tenderness, and RUQ tenderness.   Extremities:  trace left pedal edema and trace right pedal edema,  Neurologic:  alert & oriented X3.   Skin:  turgor normal and color normal.     Impression & Recommendations:  Problem # 1:  ABDOMINAL PAIN RIGHT UPPER QUADRANT (ICD-789.01) concern for choleycystitis, will get abd u/s to evaluate.  CMP to monitor electrolytes, UA to check for blood see pt instructions Orders: [facility]- Est  Level 4 (99214) Comp Met-[facility] (32440-10272) CBC-[facility] (53664) Urinalysis-[facility] (00000) Ultrasound (Ultrasound)  Problem # 2:  GERD (ICD-530.81) conitnue with ompeprazole Her updated medication list for this problem includes:    [facility] Omeprazole 20 Mg Tbec (Omeprazole) .Marland Kitchen... Take 1 pill by mouth  Orders: [facility]- Est  Level 4 (40347)  Problem # 3:  HYPERTENSION, BENIGN SYSTEMIC (ICD-401.1) BP slightly elevated, likely due to current pain pt is having.   Continue previous meds and recheck at next OV  Complete Medication List: 1)  Meloxicam 7.5 Mg Tabs (Meloxicam) .... One to two tablets daily as needed for pain 2)  Miralax Powd (Polyethylene glycol 3350) .Marland Kitchen.. 17g by mouth up to two times a day as needed constipation. dispense one bottle. 3)  Meperitab 50 Mg Tabs (Meperidine hcl) .... One by mouth three times a day as needed pain 4)  Promethazine Hcl 25 Mg Tabs (Promethazine hcl) .... One by mouth three times a day as needed 5)  Detrol 2 Mg Tabs (Tolterodine tartrate) .... Two times a day 6)  [facility] Omeprazole 20 Mg Tbec  (Omeprazole) .... Take 1 pill by mouth 7)  Fluticasone Propionate 50 Mcg/act Susp (Fluticasone propionate) .... One puff each nostril daily  Patient Instructions: 1)  I will schedule an ultrasound to evaluate your abdominal pain. 2)  Continue taking your omeprazole daily 3)  Use your zofran as needed for nausea. 4)  Make an appointment next Tuesday or Wednsday and we will review your results and I will inject your thumb if it is still bothering you.  Laboratory Results   Urine Tests  Date/Time Received: April 18, 2010 3:21 PM  Date/Time Reported: April 18, 2010 3:26 PM   Routine Urinalysis   Color: yellow Appearance: Clear Glucose: negative   (Normal Range: Negative) Bilirubin: negative   (Normal Range: Negative) Ketone: negative   (Normal Range: Negative) Spec. Gravity: >=1.030   (Normal Range: 1.003-1.035) Blood: negative   (Normal Range: Negative) pH: 6.5   (Normal Range: 5.0-8.0) Protein: negative   (Normal Range: Negative) Urobilinogen: 1.0   (Normal  Range: 0-1) Nitrite: negative   (Normal Range: Negative) Leukocyte Esterace: negative   (Normal Range: Negative)    Comments: ...............test performed by......Marland KitchenBonnie A. [place], MLS (ASCP)cm

## 2010-09-19 NOTE — Letter (Signed)
Summary: Generic Letter  [name]  [address]   [place], [place] [address]   Phone: [phone]  Fax: [phone]    10/20/2009  [name] [address] [place], [place]  [address]  Dear Ms. Elliston,    Your recent lab work looked normal.  If you are not getting better after your antibiotics, as we discussed, please call me.         Sincerely,   Alvia Grove DO

## 2010-09-19 NOTE — Letter (Signed)
Summary: New Patient letter  [facility]  [address] [place], [place] [address]   Phone: [phone]  Fax: [phone]       04/25/2010 MRN: [id number]  [facility] [address] [place], [place]  [address]  Dear [name],  Welcome to the Gastroenterology Division at [facility].    You are scheduled to see Dr.  Arlyce Dice on 06-06-10 at 10:45A.M. on the 3rd floor at [facility], [address]  We ask that you try to arrive at our office 15 minutes prior to your appointment time to allow for check-in.  We would like you to complete the enclosed self-administered evaluation form prior to your visit and bring it with you on the day of your appointment.  We will review it with you.  Also, please bring a complete list of all your medications or, if you prefer, bring the medication bottles and we will list them.  Please bring your insurance card so that we may make a copy of it.  If your insurance requires a referral to see a specialist, please bring your referral form from your primary care physician.  Co-payments are due at the time of your visit and may be paid by cash, check or credit card.     Your office visit will consist of a consult with your physician (includes a physical exam), any laboratory testing he/she may order, scheduling of any necessary diagnostic testing (e.g. x-ray, ultrasound, CT-scan), and scheduling of a procedure (e.g. Endoscopy, Colonoscopy) if required.  Please allow enough time on your schedule to allow for any/all of these possibilities.    If you cannot keep your appointment, please call [phone] to cancel or reschedule prior to your appointment date.  This allows [place] the opportunity to schedule an appointment for another patient in need of care.  If you do not cancel or reschedule by 5 p.m. the business day prior to your appointment date, you will be charged a $50.00 late cancellation/no-show fee.    Thank you for choosing  [facility] for your medical needs.  We appreciate the opportunity to care for you.  Please visit [place] at our website  to learn more about our practice.                     Sincerely,                                                             The Gastroenterology Division

## 2010-09-19 NOTE — Assessment & Plan Note (Signed)
Summary: F/U/KH   Vital Signs:  Patient profile:   50 year old [name] Height:      66.4 inches Weight:      284 pounds BMI:     45.45 Temp:     98.3 degrees F oral Pulse rate:   80 / minute BP sitting:   160 / 86  (left arm) Cuff size:   large  Vitals Entered By: Tessie Fass CMA (January 23, 2010 2:18 PM) CC: F/U Is Patient Diabetic? No Pain Assessment Patient in pain? yes     Location: right knee Intensity: 4   Primary Care Provider:  Alvia Grove DO  CC:  F/U.  History of Present Illness: 50 yo [name] with multiple concerns today. 1.Chronic Sinus pressure/pain: Pt reports hx of frequent sinus infections.  Had a CT of sinuses done about 5 years ago but doesn't remember the outcome.  In Feb was tx with prednisone, bactrim x 10 days, with some improvement, but not complete resolution.  Pt reports nasal drainage, facial congestion, pressure and pain that has been persistant for 3-4 months.  Pt had been on flonase since Feb and recently stopped it due to nasal dryness and irritation. Hx of allergies, not taking daily antihistamine.  Tried zyrtec in the past w/improvement. 2. HTN: Recently re-started on HCTZ 12.5mg  PO daily. BP today is elevated. Pt is c/o peripheral edema.   No c/p, No SOB, no palpitations, no vision changes.  Unsure why pt was taken off HCTZ as she reports no adverse reactions with the medicine.  Will check CMP today for baseline BUN/cr and electrolytes.  If stable will increase HCTZ.   3. Pulm: Had sleep study done a few years ago and was dx with OSA.  Never got CPAP due to finances. Is c/o of worsening snoring and fatigue.   4. Constipation: chronic.  Last BM this AM.  BM's infrequent and intermittent.  Stool softners help, but pt forgets to take them.  No diarrhea, BM's non bloody and soft.    Habits & Providers  Alcohol-Tobacco-Diet     Alcohol drinks/day: 0     Tobacco Status: never  Exercise-Depression-Behavior     Have you felt down or hopeless? no     Have you felt little pleasure in things? no  Current Problems (verified): 1)  Cerumen Impaction, Bilateral  (ICD-380.4) 2)  Otitis Externa, Bilateral  (ICD-380.10) 3)  Gerd  (ICD-530.81) 4)  Jaw Pain  (ICD-526.9) 5)  Incontinence, Urge  (ICD-788.31) 6)  Intertrigo  (ICD-695.89) 7)  Back Pain, Chronic  (ICD-724.5) 8)  Sleep Apnea, Obstructive, Severe  (ICD-327.23) 9)  Hyperglycemia  (ICD-790.6) 10)  Rhinitis, Allergic Nos  (ICD-477.9) 11)  Osteoarthritis, Lower Leg  (ICD-715.96) 12)  Obesity, Nos  (ICD-278.00) Anne)  Insomnia Nos  (ICD-780.52) 14)  Hypertension, Benign Systemic  (ICD-401.1) 15)  Constipation  (ICD-564.0)  Current Medications (verified): 1)  Meloxicam 7.5 Mg Tabs (Meloxicam) .... One To Two Tablets Daily As Needed For Pain 2)  Miralax   Powd (Polyethylene Glycol 3350) .Marland Kitchen.. 17g By Mouth Up To Two Times A Day As Needed Constipation. Dispense One Bottle. 3)  Meperitab 50 Mg Tabs (Meperidine Hcl) .... One By Mouth Three Times A Day As Needed Pain 4)  Promethazine Hcl 25 Mg Tabs (Promethazine Hcl) .... One By Mouth Three Times A Day As Needed 5)  Detrol 2 Mg Tabs (Tolterodine Tartrate) .... Two Times A Day 6)  Cvs Omeprazole 20 Mg Tbec (Omeprazole) .... Take 1 Pill  By Mouth 7)  Neomycin-Polymyxin-Hc 3.5-10000-1 Soln (Neomycin-Polymyxin-Hc) .... 4 Gtt Each Ear Three Times A Day For 10 Days.  Disp Qs 8)  Hydrochlorothiazide 12.5 Mg Caps (Hydrochlorothiazide) .Marland Kitchen.. 1 By Mouth Once Daily For Blood Pressure 9)  Allegra 180 Mg Tabs (Fexofenadine Hcl) .... Take 1 Pill By Mouth Daily For Allergies 10)  Beconase Aq 42 Mcg/spray Susp (Beclomethasone Diprop Monohyd) .Marland Kitchen.. 1-2 Sprays/nostril Bid 11)  Amoxicillin 500 Mg Caps (Amoxicillin) .... Take 2 Pills By Mouth Three Times A Day X 21 Days 12)  Prednisone 20 Mg Tabs (Prednisone) .... Take 1 Pill By Mouth X 5 Days  Allergies (verified): 1)  Naprosyn (Naproxen)  Past History:  Past Medical History: Last updated: 05/14/2008 Iron  deficiency anemia resolved s/p TAH (fibroid tumors)  knee dislocation 4/05  obesity bilateral knee OA Chronic back pain s/p multiple surgeries - follow by Dr Charlett Blake [facility] h/o post partum depression  Past Surgical History: Last updated: 07/30/2007 lamenectomy L4-5 - 01/18/2005 rt knee aspiration (Voytec) - 07/20/2005 Surgery for L3/4 herniation with nerve impingement - 01/19/2003 TAH (no BSO) - 2* to DUB, fibroids - 10/19/2002  Family History: Last updated: 10/29/2006 Father - 71y, HTN, DM Mother - 12y, HTN, DM No h/o CAD, CVA, CA  Social History: Last updated: 08/30/2008 Has three children, 2 girls and one boy.  1 occasional sexual partner.  No tob/ETOH/drugs.  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 Son has become a behavior problem and stresses her signficantly.   Risk Factors: Alcohol Use: 0 (01/23/2010)  Risk Factors: Smoking Status: never (01/23/2010)  Review of Systems       The patient complains of peripheral edema and abdominal pain.   ENT:  Complains of nasal congestion, postnasal drainage, and sinus pressure. GI:  Complains of abdominal pain and constipation.  Physical Exam  General:  VS reviewed: HTN, alert, well-developed, and well-nourished.   Ears:  External ear exam shows no significant lesions or deformities.  Otoscopic examination reveals clear canals, tympanic membranes are intact bilaterally without bulging, retraction, inflammation or discharge. Hearing is grossly normal bilaterally. Nose:  mucosal erythema, mucosal edema, L maxillary sinus tenderness, and R maxillary sinus tenderness.   Mouth:  pharynx pink and moist and postnasal drip.   Neck:  supple, full ROM, and no masses.   Lungs:  Normal respiratory effort, chest expands symmetrically. Lungs are clear to auscultation, no crackles or wheezes. Heart:  Normal rate and regular rhythm. S1 and S2 normal without gallop, murmur, click, rub or other extra  sounds. Abdomen:  soft, non-tender, normal bowel sounds, and no distention.   Msk:  normal ROM, no joint swelling, and no joint warmth.   Extremities:  trace left pedal edema and trace right pedal edema.   Neurologic:  alert & oriented X3, cranial nerves II-XII intact, and DTRs symmetrical and normal.   Skin:  color normal.   Cervical Nodes:  No lymphadenopathy noted Psych:  Oriented X3.     Impression & Recommendations:  Problem # 1:  SINUSITIS, RECURRENT (ICD-473.9) Assessment Deteriorated  Discussed with Dr. Tressia Danas, preceptor.   Will tx aggressively with long course Abx, short course steroid and nasal spray. Also add daily allergy pill.  If no improvement in 2-3 weeks, will plan to referr to ENT.  See pt instructions The following medications were removed from the medication list:    Fluticasone Propionate 50 Mcg/act Susp (Fluticasone propionate) ..... Spray 1 spray into both  nostrils once a day Her updated medication list for this problem includes:    Beconase Aq 42 Mcg/spray Susp (Beclomethasone diprop monohyd) .Marland Kitchen... 1-2 sprays/nostril bid    Amoxicillin 500 Mg Caps (Amoxicillin) .Marland Kitchen... Take 2 pills by mouth three times a day x 21 days  Orders: [facility]- Est  Level 4 (96295)  Problem # 2:  SLEEP APNEA, OBSTRUCTIVE, SEVERE (ICD-327.23) Assessment: Unchanged  Obtained sleep study report and recommendations.  Script for machine and titration given. Also ambien 5mg  by mouth at bedtime while beginning CPAP.   Orders: [facility]- Est  Level 4 (28413)  Problem # 3:  HYPERTENSION, BENIGN SYSTEMIC (ICD-401.1) BP elevated today.  Will check CMP if WNL will increase HCTZ and monitor.  Also will imporve periphreal edema Her updated medication list for this problem includes:    Hydrochlorothiazide 12.5 Mg Caps (Hydrochlorothiazide) .Marland Kitchen... 1 by mouth once daily for blood pressure  Orders: [facility] (24401-02725) [facility]- Est  Level 4 (36644)  Problem # 4:  CONSTIPATION  (ICD-564.0) Assessment: Unchanged continue with miralax, high fiber diet Her updated medication list for this problem includes:    Miralax Powd (Polyethylene glycol 3350) .Marland KitchenMarland KitchenMarland KitchenMarland Kitchen 17g by mouth up to two times a day as needed constipation. dispense one bottle.  Complete Medication List: 1)  Meloxicam 7.5 Mg Tabs (Meloxicam) .... One to two tablets daily as needed for pain 2)  Miralax Powd (Polyethylene glycol 3350) .Marland Kitchen.. 17g by mouth up to two times a day as needed constipation. dispense one bottle. 3)  Meperitab 50 Mg Tabs (Meperidine hcl) .... One by mouth three times a day as needed pain 4)  Promethazine Hcl 25 Mg Tabs (Promethazine hcl) .... One by mouth three times a day as needed 5)  Detrol 2 Mg Tabs (Tolterodine tartrate) .... Two times a day 6)  Cvs Omeprazole 20 Mg Tbec (Omeprazole) .... Take 1 pill by mouth 7)  Neomycin-polymyxin-hc 3.5-10000-1 Soln (Neomycin-polymyxin-hc) .... 4 gtt each ear three times a day for 10 days.  disp qs 8)  Hydrochlorothiazide 12.5 Mg Caps (Hydrochlorothiazide) .Marland Kitchen.. 1 by mouth once daily for blood pressure 9)  Allegra 180 Mg Tabs (Fexofenadine hcl) .... Take 1 pill by mouth daily for allergies 10)  Beconase Aq 42 Mcg/spray Susp (Beclomethasone diprop monohyd) .Marland Kitchen.. 1-2 sprays/nostril bid 11)  Amoxicillin 500 Mg Caps (Amoxicillin) .... Take 2 pills by mouth three times a day x 21 days 12)  Prednisone 20 Mg Tabs (Prednisone) .... Take 1 pill by mouth x 5 days  Patient Instructions: 1)  Start allegra daily for allergies 2)  Start Beconase nasal spray 1-2 sprays per nostril two times a day 3)  Start prednisone 1 pill daily x 5 days 4)  Amoxicillin 500mg  (2 pills) by mouth three times a day x 21 days 5)  If no better after this regimen, we will need to refer you to ENT. 6)  Diflucan 150mg  by mouth x 2 in case of yeast infection. 7)  [facility]AP [facility] to [facility] your machine 8)  f/u in 2-3 weeks Prescriptions: PREDNISONE 20 MG TABS (PREDNISONE)  take 1 pill by mouth x 5 days  #5 x 0   Entered and Authorized by:   Alvia Grove DO   Signed by:   Alvia Grove DO on 01/23/2010   Method used:   Print then Give to Patient   RxID:   [id number] AMOXICILLIN 500 MG CAPS (AMOXICILLIN) take 2 pills by mouth three times a day x 21 days  #126  x 0   Entered and Authorized by:   Alvia Grove DO   Signed by:   Alvia Grove DO on 01/23/2010   Method used:   Print then Give to Patient   RxID:   [id number] BECONASE AQ 42 MCG/SPRAY SUSP (BECLOMETHASONE DIPROP MONOHYD) 1-2 sprays/nostril BID  #QS x 2   Entered and Authorized by:   Alvia Grove DO   Signed by:   Alvia Grove DO on 01/23/2010   Method used:   Print then Give to Patient   RxID:   [id number] ALLEGRA 180 MG TABS (FEXOFENADINE HCL) take 1 pill by mouth daily for allergies  #30 x 5   Entered and Authorized by:   Alvia Grove DO   Signed by:   Alvia Grove DO on 01/23/2010   Method used:   Print then Give to Patient   RxID:   [id number]

## 2010-09-19 NOTE — Assessment & Plan Note (Signed)
Summary: bloated abd-see notes/[place]   Vital Signs:  Patient profile:   50 year old female Height:      66.4 inches Weight:      285 pounds BMI:     45.61 BSA:     2.34 Temp:     98.1 degrees F Pulse rate:   89 / minute BP sitting:   157 / 88  Vitals Entered By: Jone Baseman CMA (April 25, 2010 11:50 AM) CC: abdominal pain Is Patient Diabetic? No Pain Assessment Patient in pain? yes     Location: stomach Intensity: 7   Primary Care Provider:  Alvia Grove DO  CC:  abdominal pain.  History of Present Illness: 50 yo female here for f/u acute on chronic constipation: 1. Last BM yesterday AM after multiple stool softners and 2 enemas.  Stool very hard and only possible after heavy straining.  Non bloody.   Ultarsound last week WNL. CMP, CBC last week normal. By mouth intake normal, UOP normal.  Pt continues to c/o of abdominal pain, now in entire lower quadrant across abdomen.   No fevers, no chills, no sweats. Family hx of colon cancer; pt has never had a colonoscopy. Does have a remote hx of IBS, but has not required meds for this in >5 years.   Habits & Providers  Alcohol-Tobacco-Diet     Alcohol drinks/day: 0     Tobacco Status: never     Tobacco Counseling: not indicated; no tobacco use  Current Problems (verified): 1)  Abdominal Pain Right Upper Quadrant  (ICD-789.01) 2)  Leg Pain, Bilateral  (ICD-729.5) 3)  Leg Pain, Bilateral  (ICD-729.5) 4)  Sinusitis, Recurrent  (ICD-473.9) 5)  Cerumen Impaction, Bilateral  (ICD-380.4) 6)  Otitis Externa, Bilateral  (ICD-380.10) 7)  Gerd  (ICD-530.81) 8)  Jaw Pain  (ICD-526.9) 9)  Incontinence, Urge  (ICD-788.31) 10)  Intertrigo  (ICD-695.89) 11)  Back Pain, Chronic  (ICD-724.5) 12)  Sleep Apnea, Obstructive, Severe  (ICD-327.23) 13)  Hyperglycemia  (ICD-790.6) 14)  Rhinitis, Allergic Nos  (ICD-477.9) 15)  Osteoarthritis, Lower Leg  (ICD-715.96) 16)  Obesity, Nos  (ICD-278.00) 17)  Insomnia Nos   (ICD-780.52) 18)  Hypertension, Benign Systemic  (ICD-401.1) 19)  Constipation  (ICD-564.0)  Current Medications (verified): 1)  Meloxicam 7.5 Mg Tabs (Meloxicam) .... One To Two Tablets Daily As Needed For Pain 2)  Miralax   Powd (Polyethylene Glycol 3350) .Marland Kitchen.. 17g By Mouth Up To Two Times A Day As Needed Constipation. Dispense One Bottle. 3)  Meperitab 50 Mg Tabs (Meperidine Hcl) .... One By Mouth Three Times A Day As Needed Pain 4)  Promethazine Hcl 25 Mg Tabs (Promethazine Hcl) .... One By Mouth Three Times A Day As Needed 5)  Detrol 2 Mg Tabs (Tolterodine Tartrate) .... Two Times A Day 6)  Cvs Omeprazole 20 Mg Tbec (Omeprazole) .... Take 1 Pill By Mouth 7)  Fluticasone Propionate 50 Mcg/act Susp (Fluticasone Propionate) .... One Puff Each Nostril Daily 8)  Amitiza 24 Mcg Caps (Lubiprostone) .... Take 1 Pill By Mouth Tid 9)  Lactulose  Soln (Lactulose) .... Use 30 Ml By Mouth Two Times A Day As Needed Constipation (Max Dose of 66ml/day)  Allergies (verified): 1)  Naprosyn (Naproxen)  Past History:  Past Medical History: Last updated: 05/14/2008 Iron deficiency anemia resolved s/p TAH (fibroid tumors)  knee dislocation 4/05  obesity bilateral knee OA Chronic back pain s/p multiple surgeries - follow by Dr Charlett Blake of [facility] h/o post partum depression  Past Surgical  History: Last updated: 02/22/2010 lamenectomy L4-5 - 01/18/2005 rt knee aspiration (Voytec) - 07/20/2005 Surgery for L3/4 herniation with nerve impingement - 01/19/2003 TAH (no BSO) - 2* to DUB, fibroids - 10/19/2002 06/2009: Exploratory laparotomy, Lysis of adhesions,  Bilateral salpingo-oophorectomy.    Family History: Last updated: 04/25/2010 Father - 71y, HTN, DM Mother - 30y, HTN, DM No h/o CAD, CVA, CA Colon ca in Uncle  Social History: Last updated: 04/25/2010 Has three children, 2 girls and one boy.  1 occasional sexual partner.  No tob/ETOH/drugs.  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 Son has become a behavior problem and stresses her signficantly.   Risk Factors: Alcohol Use: 0 (04/25/2010)  Risk Factors: Smoking Status: never (04/25/2010)  Family History: Reviewed history from 10/29/2006 and no changes required. Father - 37y, HTN, DM Mother - 48y, HTN, DM No h/o CAD, CVA, CA Colon ca in Uncle  Social History: Reviewed history from 08/30/2008 and no changes required. Has three children, 2 girls and one boy.  1 occasional sexual partner.  No tob/ETOH/drugs.  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 Son has become a behavior problem and stresses her signficantly.   Review of Systems       The patient complains of abdominal pain.  The patient denies fever, weight loss, weight gain, hemoptysis, melena, and hematochezia.    Physical Exam  General:  Vs reviewed, hypertensive, well-nourished and well-hydrated.   Lungs:  normal respiratory effort, normal breath sounds, no crackles, and no wheezes Heart:  normal rate, regular rhythm, no murmur, NOT tachycardic Abdomen:  normal bowel sounds, no distention, no masses, abdominal scar(s), RLQ tenderness, and LLQ tenderness.   Extremities:  trace left pedal edema and trace right pedal edema,  Skin:  turgor normal, color normal, and no rashes.     Impression & Recommendations:  Problem # 1:  CONSTIPATION (ICD-564.0) Assessment Deteriorated  chronic per pt report. Hx of IBS. Last BM yesterday after multiple stool softners and 2 enemas.  Family hx of colon ca. Will start amitiza daily and increase to two times a day as pt tolerates. Referr to GI for colonoscopy to evaluate for underlying etiology. Her updated medication list for this problem includes:    Miralax Powd (Polyethylene glycol 3350) .Marland KitchenMarland KitchenMarland KitchenMarland Kitchen 17g by mouth up to two times a day as needed constipation. dispense one bottle.  Orders: [facility]- Est Level  3  (95621)  Complete Medication List: 1)  Meloxicam 7.5 Mg Tabs (Meloxicam) .... One to two tablets daily as needed for pain 2)  Miralax Powd (Polyethylene glycol 3350) .Marland Kitchen.. 17g by mouth up to two times a day as needed constipation. dispense one bottle. 3)  Meperitab 50 Mg Tabs (Meperidine hcl) .... One by mouth three times a day as needed pain 4)  Promethazine Hcl 25 Mg Tabs (Promethazine hcl) .... One by mouth three times a day as needed 5)  Detrol 2 Mg Tabs (Tolterodine tartrate) .... Two times a day 6)  Cvs Omeprazole 20 Mg Tbec (Omeprazole) .... Take 1 pill by mouth 7)  Fluticasone Propionate 50 Mcg/act Susp (Fluticasone propionate) .... One puff each nostril daily 8)  Amitiza 24 Mcg Caps (Lubiprostone) .... Take 1 pill by mouth tid 9)  Lactulose Soln (Lactulose) .... Use 30 ml by mouth two times a day as needed constipation (max dose of 55ml/day)  Other Orders:  Colonoscopy (Colon)  Patient Instructions: 1)  Start taking Amitiza 1 pill by mouth daily. 2)  You can increase to twice a day if needed. 3)  I will schedule you for a colonoscopy. 4)  Please schedule a follow-up appointment in 1 month.  Prescriptions: LACTULOSE  SOLN (LACTULOSE) use 30 mL by mouth two times a day as needed constipation (max dose of 79mL/day)  #367mL x 1   Entered and Authorized by:   Alvia Grove DO   Signed by:   Alvia Grove DO on 04/25/2010   Method used:   Electronically to        [facility]  [address] [id number] (retail)       [address]       [place], [place]  [address]       Ph: [phone]       Fax: [phone]   RxID:   [id number] AMITIZA 24 MCG CAPS (LUBIPROSTONE) take 1 pill by mouth TID  #90 x 1   Entered and Authorized by:   Alvia Grove DO   Signed by:   Alvia Grove DO on 04/25/2010   Method used:   [facility]ron[facility] to        [facility]  [address] [phone][facility] (retail)       [address]       [place], [place]  [address]       Ph:  [phone]       Fax: [phone]   RxID:   [id number]    Prevention & Chronic Care Immunizations   Influenza vaccine: Not documented    Tetanus booster: 08/20/1998: Done.   Tetanus booster due: 08/20/2008    Pneumococcal vaccine: Not documented  Colorectal Screening   Hemoccult: Not documented    Colonoscopy: Not documented   Colonoscopy action/deferral: GI Referral  (04/25/2010)  Other Screening   Pap smear: Not documented    Mammogram: ASSESSMENT: Negative - BI-RADS 1^MM DIGITAL SCREENING  (08/10/2009)   Mammogram due: 08/10/2010   Smoking status: never  (04/25/2010)  Lipids   Total Cholesterol: 199  (08/30/2008)   LDL: 135  (08/30/2008)   LDL Direct: 139  (07/30/2007)   HDL: 43  (08/30/2008)   Triglycerides: 106  (08/30/2008)  Hypertension   Last Blood Pressure: 157 / 88  (04/25/2010)   Serum creatinine: 0.91  (04/18/2010)   Serum potassium 4.2  (04/18/2010)  Self-Management Support :    Hypertension self-management support: Not documented   Nursing Instructions: Screening colonoscopy ordered

## 2010-09-19 NOTE — Assessment & Plan Note (Signed)
Summary: ear problem,tcb   Vital Signs:  Patient profile:   50 year old female Height:      66.4 inches Weight:      284 pounds BMI:     45.45 BSA:     2.33 Temp:     98.4 degrees F Pulse rate:   83 / minute BP sitting:   151 / 84  Vitals Entered By: Jone Baseman CMA (Dec 22, 2009 9:54 AM) CC: left ear pain x 2 weeks Is Patient Diabetic? No Pain Assessment Patient in pain? yes     Location: left ear Intensity: 6   Primary Care Provider:  Alvia Grove DO  CC:  left ear pain x 2 weeks.  History of Present Illness: ear pain: actually bilateral but left signficantly worse.  has been using qtips in both ears but noticed it was harder to hear - thinks she pushed ear wax back into canal further.  so then bought ear wax kid and notice stuff coming out.  tried to get more out with qtip but this irritated ear.  she thought it was then wet so she bought some drying drops which burned and tingled so she went back to qtip to get wet out and started using the debrox again.  no with pain in ears.  feels like she is hearing an echo.  also having some ringing in the ears bilaterally.  hard to concentrated on things because of the ear pain.    of note she has had some chronic sinus irritation that dr Gomez Cleverly has been trying nasal sprays for.  she isn't sure these or the antibiotics have helped much.   she is also concernerd because they took her off her BP meds but she finds she feels she is retaining fluid and her BP is up today (but she does note she is in pain).  Habits & Providers  Alcohol-Tobacco-Diet     Tobacco Status: never  Current Medications (verified): 1)  Fluticasone Propionate 50 Mcg/act Susp (Fluticasone Propionate) .... Spray 1 Spray Into Both Nostrils Once A Day 2)  Meloxicam 7.5 Mg Tabs (Meloxicam) .... One To Two Tablets Daily As Needed For Pain 3)  Zyrtec Allergy 10 Mg Tabs (Cetirizine Hcl) .... Take 1 Tablet By Mouth At Bedtime 4)  Miralax   Powd (Polyethylene  Glycol 3350) .Marland Kitchen.. 17g By Mouth Up To Two Times A Day As Needed Constipation. Dispense One Bottle. 5)  Meperitab 50 Mg Tabs (Meperidine Hcl) .... One By Mouth Three Times A Day As Needed Pain 6)  Promethazine Hcl 25 Mg Tabs (Promethazine Hcl) .... One By Mouth Three Times A Day As Needed 7)  Detrol 2 Mg Tabs (Tolterodine Tartrate) .... Two Times A Day 8)  Cvs Omeprazole 20 Mg Tbec (Omeprazole) .... Take 1 Pill By Mouth  Allergies (verified): 1)  Naprosyn (Naproxen)  Past History:  Past medical, surgical, family and social histories (including risk factors) reviewed for relevance to current acute and chronic problems.  Past Medical History: Reviewed history from 05/14/2008 and no changes required. Iron deficiency anemia resolved s/p TAH (fibroid tumors)  knee dislocation 4/05  obesity bilateral knee OA Chronic back pain s/p multiple surgeries - follow by Dr Charlett Blake of [facility] h/o post partum depression  Past Surgical History: Reviewed history from 07/30/2007 and no changes required. lamenectomy L4-5 - 01/18/2005 rt knee aspiration (Voytec) - 07/20/2005 Surgery for L3/4 herniation with nerve impingement - 01/19/2003 TAH (no BSO) - 2* to DUB, fibroids - 10/19/2002  Family History: Reviewed history from 10/29/2006 and no changes required. Father - 40y, HTN, DM Mother - 64y, HTN, DM No h/o CAD, CVA, CA  Social History: Reviewed history from 08/30/2008 and no changes required. Has three children, 2 girls and one boy.  1 occasional sexual partner.  No tob/ETOH/drugs.  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 Son has become a behavior problem and stresses her signficantly.   Review of Systems       per HPI. denies fevers  Physical Exam  General:  VS reviewed alert, well-developed, well-nourished, well-hydrated, and overweight-appearing.   Head:  normocephalic and atraumatic.   Eyes:  vision grossly intact.   Ears:  unable to visualize either TM  2/2 cerumen impaction . L canal irritated.   after removal: unable to get complete removal of R side but canal irritated and TM also erythematous though not bulging.  similarly irritated and erythematous on left but not bulging Nose:  bilaterak mucosal erythema and mucosal edema.  No tenderness at sinuses bilaterally Mouth:  good dentition, pharynx pink and moist, no erythema, no exudates, no postnasal drip, and no lesions.   Neck:  No deformities, masses, or tenderness noted.   Impression & Recommendations:  Problem # 1:  OTITIS EXTERNA, BILATERAL (ICD-380.10) Assessment New  start cortisoprin gtt for 10 days.  if not improving or worsening to call for sooner appt.  may need given this findings and also sinus troubles an ENT consult.  Her updated medication list for this problem includes:    Neomycin-polymyxin-hc 3.5-10000-1 Soln (Neomycin-polymyxin-hc) .Marland KitchenMarland KitchenMarland KitchenMarland Kitchen 4 gtt each ear three times a day for 10 days.  disp qs  Orders: [facility]- Est  Level 4 (29562)  Problem # 2:  CERUMEN IMPACTION, BILATERAL (ICD-380.4) Assessment: New  encouraged to only use ear wax removal kit and NOT qtips.  Orders: [facility]- Est  Level 4 (13086)  Problem # 3:  RHINITIS, ALLERGIC NOS (ICD-477.9) Assessment: Deteriorated  encouraged to use flonase as recommended  Her updated medication list for this problem includes:    Fluticasone Propionate 50 Mcg/act Susp (Fluticasone propionate) ..... Spray 1 spray into both nostrils once a day    Zyrtec Allergy 10 Mg Tabs (Cetirizine hcl) .Marland Kitchen... Take 1 tablet by mouth at bedtime    Promethazine Hcl 25 Mg Tabs (Promethazine hcl) ..... One by mouth three times a day as needed  Orders: [facility]- Est  Level 4 (57846)  Problem # 4:  HYPERTENSION, BENIGN SYSTEMIC (ICD-401.1) Assessment: Deteriorated  resume hctz at low dose.  check labs at appt with dr Gomez Cleverly 6/6  Her updated medication list for this problem includes:    Hydrochlorothiazide 12.5 Mg Caps (Hydrochlorothiazide)  .Marland Kitchen... 1 by mouth once daily for blood pressure  Orders: [facility]- Est  Level 4 (99214)  Complete Medication List: 1)  Fluticasone Propionate 50 Mcg/act Susp (Fluticasone propionate) .... Spray 1 spray into both nostrils once a day 2)  Meloxicam 7.5 Mg Tabs (Meloxicam) .... One to two tablets daily as needed for pain 3)  Zyrtec Allergy 10 Mg Tabs (Cetirizine hcl) .... Take 1 tablet by mouth at bedtime 4)  Miralax Powd (Polyethylene glycol 3350) .Marland Kitchen.. 17g by mouth up to two times a day as needed constipation. dispense one bottle. 5)  Meperitab 50 Mg Tabs (Meperidine hcl) .... One by mouth three times a day as needed pain 6)  Promethazine Hcl 25 Mg Tabs (Promethazine hcl) .... One by mouth three times a  day as needed 7)  Detrol 2 Mg Tabs (Tolterodine tartrate) .... Two times a day 8)  Cvs Omeprazole 20 Mg Tbec (Omeprazole) .... Take 1 pill by mouth 9)  Neomycin-polymyxin-hc 3.5-10000-1 Soln (Neomycin-polymyxin-hc) .... 4 gtt each ear three times a day for 10 days.  disp qs 10)  Hydrochlorothiazide 12.5 Mg Caps (Hydrochlorothiazide) .Marland Kitchen.. 1 by mouth once daily for blood pressure  Patient Instructions: 1)  Be sure to take your prescriptions as prescribed.   2)  If things get worse call to be seen again sooner rather than later. 3)  Use only the ear wax kit... NO Qtips!!!  Prescriptions: HYDROCHLOROTHIAZIDE 12.5 MG CAPS (HYDROCHLOROTHIAZIDE) 1 by mouth once daily for blood pressure  #30 x 1   Entered and Authorized by:   Ancil Boozer  MD   Signed by:   Ancil Boozer  MD on 12/22/2009   Method used:   [facility]        [facility]  [address] [phone][facility] (retail)       [address]       [place], [place]  [address]       Ph: [phone]       Fax: [phone]   RxID:   [id number] NEOMYCIN-POLYMYXIN-HC 3.5-10000-1 SOLN (NEOMYCIN-POLYMYXIN-HC) 4 gtt each ear three times a day for 10 days.  Disp QS  #1 x 0   Entered and Authorized by:   Ancil Boozer  MD   Signed by:    Ancil Boozer  MD on 12/22/2009   Method used:   [facility]        [facility]  [address] [phone][facility] (retail)       [address]       [place], [place]  [address]       Ph: [phone]       Fax: [phone]   RxID:   [id number]

## 2010-09-19 NOTE — Progress Notes (Signed)
Summary: What kind of sedation for colon?  Phone Note Call from Patient Call back at [phone]   Call For: Dr Arlyce Dice Summary of Call: Wants to know what kind of anesthesia will be used for her procedure on    Monday 06-05-10 Initial call taken by: Leanor Kail [facility],  May 31, 2010 2:39 PM  Follow-up for Phone Call        I have left the patient a voicemail advisising we use conscious sedation and the two drugs are Fentanyl and Versed.  I have asked her to call back if she has any further questions. Follow-up by: Darcey Nora RN, CGRN,  May 31, 2010 3:09 PM

## 2010-09-19 NOTE — Miscellaneous (Signed)
Summary: Nasal steroid change  Clinical Lists Changes Changed to Medicaid preferred drug.  Rx sent to pharm listed as preferred.  Doralee Albino MD  February 03, 2010 11:06 AM  Medications: Changed medication from BECONASE AQ 42 MCG/SPRAY SUSP (BECLOMETHASONE DIPROP MONOHYD) 1-2 sprays/nostril BID to FLUTICASONE PROPIONATE 50 MCG/ACT SUSP (FLUTICASONE PROPIONATE) one puff each nostril daily - Signed Rx of FLUTICASONE PROPIONATE 50 MCG/ACT SUSP (FLUTICASONE PROPIONATE) one puff each nostril daily;  #1 x 12;  Signed;  Entered by: Doralee Albino MD;  Authorized by: Doralee Albino MD;  Method [facility]: [facility] [id number] [address] [place], [place], [place]  [address], Ph: [phone], Fax: [phone]    Prescriptions: FLUTICASONE PROPIONATE 50 MCG/ACT SUSP (FLUTICASONE PROPIONATE) one puff each nostril daily  #1 x 12   Entered and Authorized by:   Doralee Albino MD   Signed by:   Doralee Albino MD on 02/03/2010   Method used:   [facility]        [facility]  [address] [id number] ([facility]ail)       [address]       [place], [place]  [address]       Ph: [phone]       Fax: [phone]   RxID:   [id number]  LM that her rx was at pharmacy.Golden Circle RN  February 03, 2010 11:25 AM

## 2010-09-26 ENCOUNTER — Ambulatory Visit (INDEPENDENT_AMBULATORY_CARE_PROVIDER_SITE_OTHER): Payer: BC Managed Care – PPO | Admitting: Family Medicine

## 2010-09-26 ENCOUNTER — Encounter: Payer: Self-pay | Admitting: Family Medicine

## 2010-09-26 DIAGNOSIS — R300 Dysuria: Secondary | ICD-10-CM

## 2010-09-26 DIAGNOSIS — J329 Chronic sinusitis, unspecified: Secondary | ICD-10-CM

## 2010-09-26 DIAGNOSIS — Z131 Encounter for screening for diabetes mellitus: Secondary | ICD-10-CM

## 2010-09-26 LAB — CONVERTED CEMR LAB
CO2: 26 meq/L (ref 19–32)
Chloride: 105 meq/L (ref 96–112)
Creatinine, Ser: 0.83 mg/dL (ref 0.40–1.20)
Ketones, urine, test strip: NEGATIVE
Nitrite: NEGATIVE
Potassium: 4 meq/L (ref 3.5–5.3)
Protein, U semiquant: NEGATIVE
Urobilinogen, UA: 0.2
Whiff Test: NEGATIVE

## 2010-09-27 ENCOUNTER — Encounter: Payer: Self-pay | Admitting: Family Medicine

## 2010-10-05 NOTE — Assessment & Plan Note (Signed)
Summary: ? uti/sinus infection   Vital Signs:  Patient profile:   50 year old female Weight:      292.5 pounds Temp:     98.5 degrees F oral Pulse rate:   86 / minute BP sitting:   132 / 83  (left arm) Cuff size:   large  Vitals Entered By: Arlyss Repress CMA, (September 26, 2010 10:13 AM) CC: dysuria. sinus pressure and drainage x 3 weeks. Is Patient Diabetic? No Pain Assessment Patient in pain? no        Primary Care Provider:  Alvia Grove DO  CC:  dysuria. sinus pressure and drainage x 3 weeks.Marland Kitchen  History of Present Illness: Frequent urination, small amounts for 3 weeks.  Had been on Detrol in the past for this, now on amitriptyline for chronic pain.  Itching of vaginal area and a lot of discharge, not sexually active for years, S/P TAH and BSO.  Sinus drainage, thick and comes down the back of her throat and cokes her for months.  Not using CPAP as it causes panic as she cannot breath from her nose.  Used nasal steroid in past for a few weeks but stopped.  Poor dietary habits and morbidly obese, " I cannot drink water".  Drinking many calories, eating a lot of food with gravy at work.  Habits & Providers  Alcohol-Tobacco-Diet     Tobacco Status: never  Current Medications (verified): 1)  Meloxicam 7.5 Mg Tabs (Meloxicam) .... One To Two Tablets Daily As Needed For Pain 2)  Miralax   Powd (Polyethylene Glycol 3350) .Marland Kitchen.. 17g By Mouth Up To Two Times A Day As Needed Constipation. Dispense One Bottle. 3)  Meperitab 50 Mg Tabs (Meperidine Hcl) .... One By Mouth Three Times A Day As Needed Pain 4)  Promethazine Hcl 25 Mg Tabs (Promethazine Hcl) .... One By Mouth Three Times A Day As Needed 5)  Cvs Omeprazole 20 Mg Tbec (Omeprazole) .... Take 1 Pill By Mouth 6)  Fluticasone Propionate 50 Mcg/act Susp (Fluticasone Propionate) .... One Puff Each Nostril Daily 7)  Hydrochlorothiazide 25 Mg Tabs (Hydrochlorothiazide) .... One Tablet By Mouth Once Daily 8)  Cpap .... As  Directed 9)  Amitriptyline Hcl 25 Mg Tabs (Amitriptyline Hcl) .... Take One Tab Before Bedtime For 5 Days and Then Increase To 2 Tabs Before Bedtime  Allergies (verified): 1)  Naprosyn (Naproxen)  Review of Systems      See HPI  Physical Exam  General:  alert, morbidly obese Ears:  TM grey with noraml landmarks Nose:  inflammed boggy turbs Mouth:  very small oral pharyngeal space, no pnd Lungs:  normal respiratory effort and normal breath sounds.   Heart:  normal rate and regular rhythm.     Impression & Recommendations:  Problem # 1:  SINUSITIS, RECURRENT (ICD-473.9)  recommended resuming nasal steroid and staying on it, adding saline often was recommended Her updated medication list for this problem includes:    Fluticasone Propionate 50 Mcg/act Susp (Fluticasone propionate) ..... One puff each nostril daily  Orders: [facility]- Est  Level 4 (40102)  Problem # 2:  DYSURIA (ICD-788.1) more frequency, normal UA, screen for DM, counseled on weight loss, she is on a stong anticholinergic (amitriptyline so should be doing the job of bladder relaxation) The following medications were removed from the medication list:    Detrol 2 Mg Tabs (Tolterodine tartrate) .Marland Kitchen..Marland Kitchen Two times a day  Orders: Urinalysis-[facility] (00000) Wet Prep- [facility] [phone]) [facility]- Est  Level 4 (64403)  Problem # 3:  OBESITY, MORBID (ICD-278.01)  recommended weight loss counseling with Dr. Gerilyn Pilgrim, counseled on simple tips to not drink calories, eat more veges and fresh fruit, 3 meals to prevent hungar.  Orders: [facility]- Est  Level 4 (16109)  Complete Medication List: 1)  Meloxicam 7.5 Mg Tabs (Meloxicam) .... One to two tablets daily as needed for pain 2)  Miralax Powd (Polyethylene glycol 3350) .Marland Kitchen.. 17g by mouth up to two times a day as needed constipation. dispense one bottle. 3)  Meperitab 50 Mg Tabs (Meperidine hcl) .... One by mouth three times a day as needed pain 4)  Promethazine Hcl 25 Mg Tabs (Promethazine hcl) ....  One by mouth three times a day as needed 5)  Cvs Omeprazole 20 Mg Tbec (Omeprazole) .... Take 1 pill by mouth 6)  Fluticasone Propionate 50 Mcg/act Susp (Fluticasone propionate) .... One puff each nostril daily 7)  Hydrochlorothiazide 25 Mg Tabs (Hydrochlorothiazide) .... One tablet by mouth once daily 8)  Cpap  .... As directed 9)  Amitriptyline Hcl 25 Mg Tabs (Amitriptyline hcl) .... Take one tab before bedtime for 5 days and then increase to 2 tabs before bedtime 10)  Amitiza 8 Mcg Caps (Lubiprostone) .... Daily  Other Orders: Basic Met-[facility] [phone])  Patient Instructions: 1)  Make apt with Dr Gerilyn Pilgrim for weight loss 2)  Record of everything you eat and drnk the week before your apt, 3)  Do not drink your calories 4)  Use the nasal spray for one month daily, two sprays in each nostril, if this is a chronic problem stay on it at least 3 times per week. 5)  Purchasing saline nasal spray and use it often to break up the thick mucus 6)  Water, water, water   Orders Added: 1)  Urinalysis-[facility] [00000] 2)  Basic Met-[facility] [[phone]] 3)  Wet Prep- [facility] [87210] 4)  [facility]- Est  Level 4 [13086]    Laboratory Results   Urine Tests  Date/Time Received: September 26, 2010 10:24 AM  Date/Time Reported: September 26, 2010 10:57 AM   Routine Urinalysis   Color: yellow Appearance: Clear Glucose: negative   (Normal Range: Negative) Bilirubin: negative   (Normal Range: Negative) Ketone: negative   (Normal Range: Negative) Spec. Gravity: 1.025   (Normal Range: 1.003-1.035) Blood: negative   (Normal Range: Negative) pH: 5.5   (Normal Range: 5.0-8.0) Protein: negative   (Normal Range: Negative) Urobilinogen: 0.2   (Normal Range: 0-1) Nitrite: negative   (Normal Range: Negative) Leukocyte Esterace: negative   (Normal Range: Negative)    Comments: .........Marland Kitchenbiochemical negative; microscopic not indicated ...............test performed by......Marland KitchenBonnie A. [place], MLS (ASCP)cm  Date/Time  Received: September 26, 2010 10:30 AM  Date/Time Reported: September 26, 2010 11:09 AM   Wet [place] Source: vag WBC/hpf: 5-15 Bacteria/hpf: 3+  Rods Clue cells/hpf: none  Negative whiff Yeast/hpf: none Trichomonas/hpf: none Comments: ...............test performed by......Marland KitchenBonnie A. [place], MLS (ASCP)cm

## 2010-10-05 NOTE — Letter (Signed)
Summary: Generic Letter  [name]  [address]   [place], [place] [address]   Phone: [phone]  Fax: [phone]    09/27/2010  [name] [address] [place], [place]  [address]  [place]  Dear [name],    I wanted to let you know that your blood sugar was 106, this is not diagnostic of diabetes.  I do think that  you are on the right track in planning to change your life, and loose weight.  Please follow up for any other problems with your primary MD.       Sincerely,   Luretha Murphy NP

## 2010-10-27 ENCOUNTER — Encounter: Payer: Self-pay | Admitting: Family Medicine

## 2010-10-27 ENCOUNTER — Ambulatory Visit (INDEPENDENT_AMBULATORY_CARE_PROVIDER_SITE_OTHER): Payer: BC Managed Care – PPO | Admitting: Family Medicine

## 2010-10-27 VITALS — BP 136/83 | HR 87 | Temp 98.5°F | Ht 66.5 in | Wt 297.8 lb

## 2010-10-27 DIAGNOSIS — M549 Dorsalgia, unspecified: Secondary | ICD-10-CM

## 2010-10-27 DIAGNOSIS — G4733 Obstructive sleep apnea (adult) (pediatric): Secondary | ICD-10-CM

## 2010-10-27 DIAGNOSIS — G4700 Insomnia, unspecified: Secondary | ICD-10-CM

## 2010-10-27 DIAGNOSIS — K5900 Constipation, unspecified: Secondary | ICD-10-CM

## 2010-10-27 DIAGNOSIS — R5383 Other fatigue: Secondary | ICD-10-CM

## 2010-10-27 DIAGNOSIS — I10 Essential (primary) hypertension: Secondary | ICD-10-CM

## 2010-10-27 DIAGNOSIS — IMO0002 Reserved for concepts with insufficient information to code with codable children: Secondary | ICD-10-CM

## 2010-10-27 DIAGNOSIS — R5381 Other malaise: Secondary | ICD-10-CM

## 2010-10-27 DIAGNOSIS — M1710 Unilateral primary osteoarthritis, unspecified knee: Secondary | ICD-10-CM

## 2010-10-27 DIAGNOSIS — N3941 Urge incontinence: Secondary | ICD-10-CM

## 2010-10-27 DIAGNOSIS — L538 Other specified erythematous conditions: Secondary | ICD-10-CM

## 2010-10-27 DIAGNOSIS — K219 Gastro-esophageal reflux disease without esophagitis: Secondary | ICD-10-CM

## 2010-10-27 LAB — CONVERTED CEMR LAB
HCT: 42.4 % (ref 36.0–46.0)
Hemoglobin: 13.9 g/dL (ref 12.0–15.0)
MCHC: 32.8 g/dL (ref 30.0–36.0)
MCV: 92 fL (ref 78.0–100.0)
Platelets: 221 10*3/uL (ref 150–400)
RDW: 12.9 % (ref 11.5–15.5)

## 2010-10-27 LAB — CBC
HCT: 42.4 % (ref 36.0–46.0)
Hemoglobin: 13.9 g/dL (ref 12.0–15.0)
MCH: 30.2 pg (ref 26.0–34.0)
MCV: 92 fL (ref 78.0–100.0)
Platelets: 221 10*3/uL (ref 150–400)
RBC: 4.61 MIL/uL (ref 3.87–5.11)
WBC: 5.2 10*3/uL (ref 4.0–10.5)

## 2010-10-27 MED ORDER — ACETAMINOPHEN ER 650 MG PO TBCR: 650.0000 mg | EXTENDED_RELEASE_TABLET | Freq: Three times a day (TID) | ORAL | Status: AC | PRN

## 2010-10-27 NOTE — Progress Notes (Signed)
  Subjective:    [name], female    DOB: Sep 18, 1960, 50 y.o.   MRN: [id number]  HPI 1. Tired Often tired (works 2 jobs) but feels may be worsening over past year. 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 Sleeps 9am-3pm. 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 ROS for depression: occasional crying spells the first of the month but denies feeling depressed; no changes in sleep/appetite  2. Hot flashes May occur several times a day. Aggravating although lasts only a few months. Denies vaginal dryness. 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 Symptoms started after ovaries removed.   3. Tooth infection Told at dentist's visit that she has an infection and need root canal. But patient must pay $900 for this procedure even with her insurance. Was not given any antibiotics.  C/o persistent tooth pain today. Denies fevers.   4. Pain in back and knees Previous diagnosis of osteoarthritis in knees.  Taking meperidine on Tues, Wed, and Thurs (when she works). Stopped meloxicam a month ago and started taking indomethacin bid instead because she felt her body wasn't responding as well to the meloxicam any more. And takes Phenergan with the meperidine because of nausea.  She gets her knee injected with gels and Dewaine Conger, 3 consecutive injections a month a part. This is the first time she is getting this treatment. She is not sure if it is helping her yet.   5. Urinary urgency Taking amitriptyline. Stopped tolterodine due to anticholinergic affect of amitriptyline.  Infrequent episodes of involuntary leakage due to urgency.  Denies dysuria.   Review of Systems    Objective:   Physical Exam  Constitutional: She appears well-developed.       overweight  Neck: Normal range of motion. Neck supple. No thyromegaly present.  Cardiovascular: Normal rate, regular  rhythm, normal heart sounds and intact distal pulses.   Pulmonary/Chest: Effort normal and breath sounds normal.  Musculoskeletal:       Back: mild diffuse lumbar TTP. Trace lower extremity edema.  Hands may be slightly generally swollen bilaterally. No bony joint changes or TTP.  Knee: no erythema, warmth, TTP, swelling bilaterally.   Skin:       Hyperpigmented macular areas under armpits bilaterally and under breasts.   Psychiatric: Her speech is normal and behavior is normal. Judgment and thought content normal. Her affect is not angry, not blunt, not labile and not inappropriate. Thought content is not paranoid and not delusional. Cognition and memory are normal. Cognition and memory are not impaired. She does not express impulsivity or inappropriate judgment. She does not exhibit a depressed mood. She expresses no homicidal and no suicidal ideation. She exhibits normal recent memory and normal remote memory.       Mildly anxious.           Assessment & Plan:

## 2010-10-27 NOTE — Patient Instructions (Signed)
Tiredness: I will call you with the results of your lab. Let's see if your red blood count is low or your thyroid hormone level is abnormal. Tiredness may also be due to going through menopause symptoms. Please read information about estrogen and we can talk about this next time. Achiness from arthritis. Please take Tylenol 650mg  every 8 hours scheduled. Continue to take the indomethacin and meperidine as well.  Follow-up with me in 1 month. Please schedule an appointment with our Nutritionist Wyona Almas (you will have to call her to make an appointment).

## 2010-10-29 ENCOUNTER — Encounter: Payer: Self-pay | Admitting: Family Medicine

## 2010-10-29 NOTE — Assessment & Plan Note (Addendum)
Will check U/A for infection.  Tolteradine discontinued since patient on amitriptyline (for insomnia?). Consider changing back to tolterodine if problem persists? Will re-evaluate at next visit and will need to also re-evaluate insomnia.

## 2010-10-29 NOTE — Assessment & Plan Note (Addendum)
Unable to ask about this during initial visit. At follow-up will ask about CPAP compliance.

## 2010-10-29 NOTE — Assessment & Plan Note (Signed)
BP is controlled off of medication. Stopped taking diuretic. Encouraged her to start taking again to see if it will help her puffy hands.

## 2010-10-29 NOTE — Assessment & Plan Note (Signed)
TSH and CBC normal.  Fatigue may be due to not sleeping well. Reviewing her problem list now, I see that she has diagnosis of severe OSA. Did not discuss at this visit but will ask about this next visit. Patient also may have a component of "fibromyalgia". Multiple diffuse complaints of various systems and work-up appears benign so far, which is re-assuring. Patient does not appear to be depressed however. But may consider having her fill out PH9Q at next visit to have another piece of information to help evaluate.

## 2010-10-29 NOTE — Assessment & Plan Note (Signed)
Persistent pain. Continue meperidine and indomethacin as needed. Also recommended Tylenol. Currently receiving gel joint injections from [facility].

## 2010-10-29 NOTE — Assessment & Plan Note (Signed)
Taking omeprazole irregularly. Complained of cost of medication ($20). I think other generics are comparable, so will recommend she continue to take this for now.

## 2010-10-29 NOTE — Assessment & Plan Note (Signed)
Unchanged. Using hypoallergenic anti-perspirants [facility]) regularly seems to help.

## 2010-10-29 NOTE — Assessment & Plan Note (Signed)
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 Will need to re-assess this problem especially with diagnosis of severe OSA.

## 2010-10-29 NOTE — Assessment & Plan Note (Signed)
Likely lumbar strain secondary to abdominal girth. Recommend lifestyle modification. Patient aware needs to lose weight. Recommend appointment with nutritionist Dr. Gerilyn Pilgrim.

## 2010-11-01 IMAGING — US US ABDOMEN COMPLETE
1 series · 14 of 25 positions shown · non-contrast
Comparison: None available.

CLINICAL DATA: Right upper quadrant pain, nausea

COMPLETE ABDOMINAL ULTRASOUND

[Series 1: us abdomen complete · 0.30mm/px · 14 of 93 slices shown]
[im 1/93]
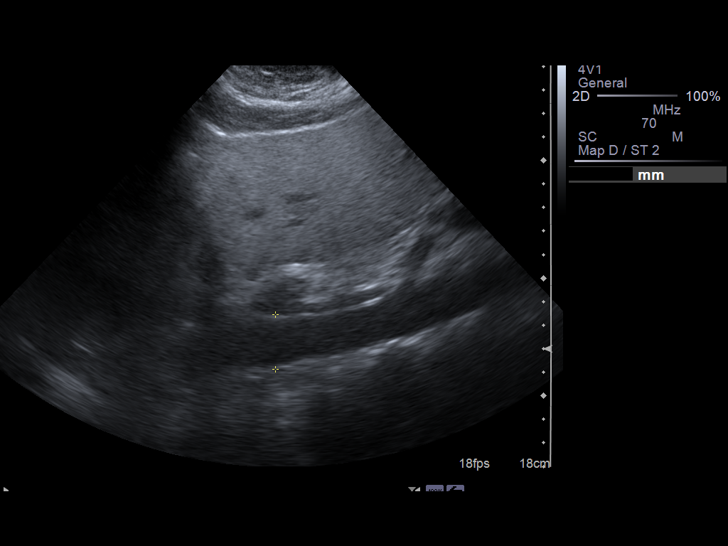
[im 8/93]
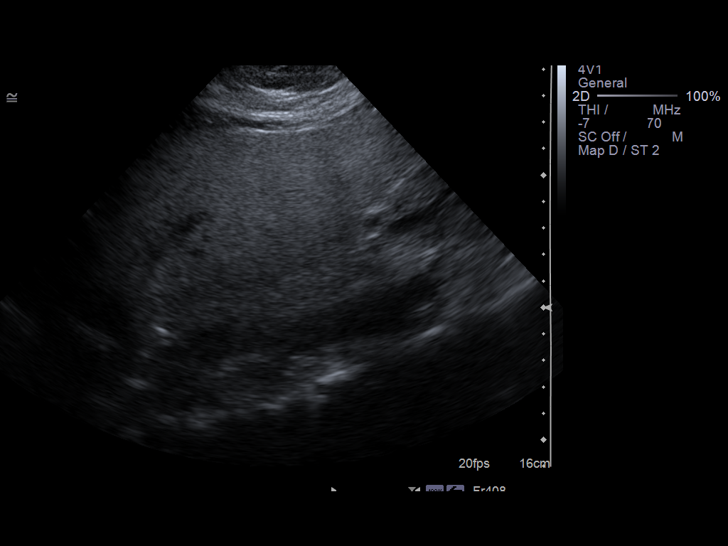
[im 16/93]
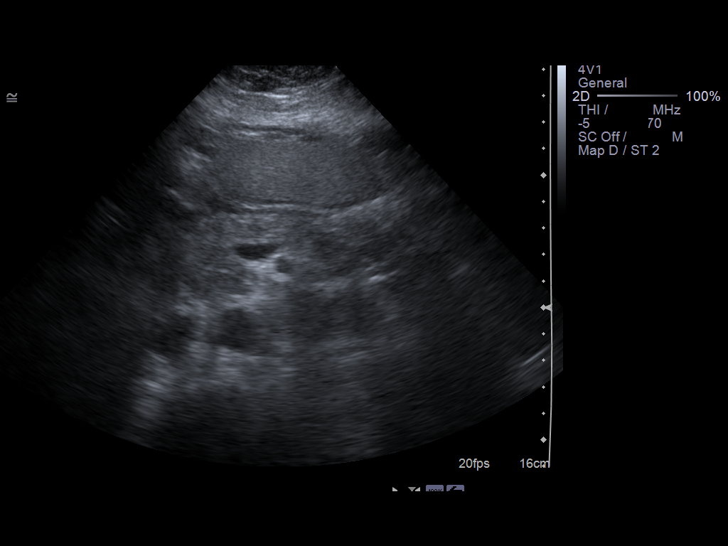
[im 24/93]
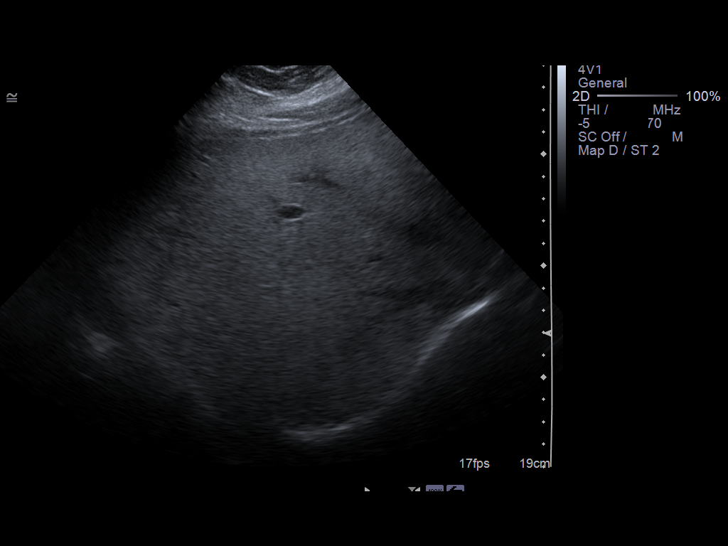
[im 31/93]
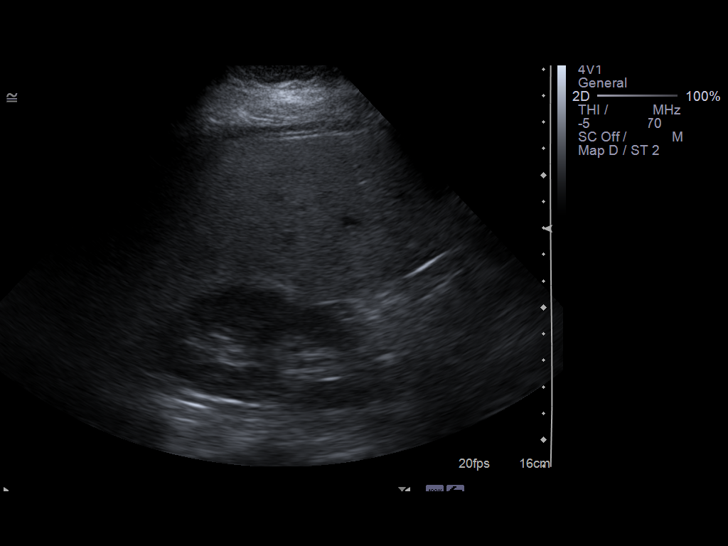
[im 35/93]
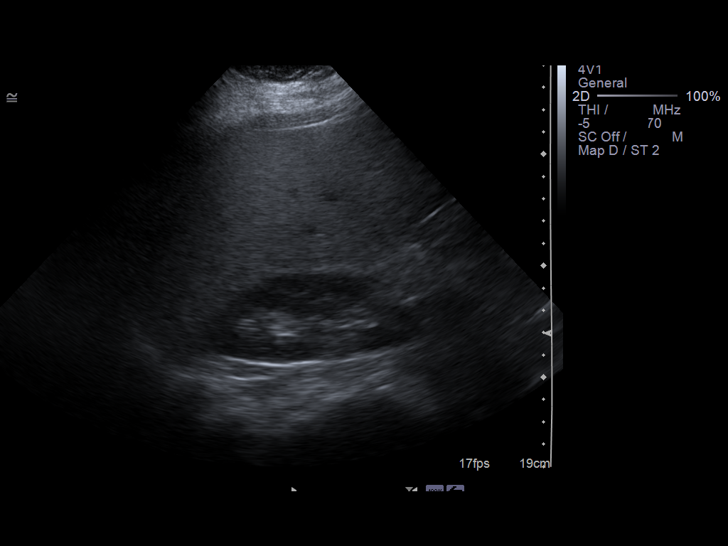
[im 43/93]
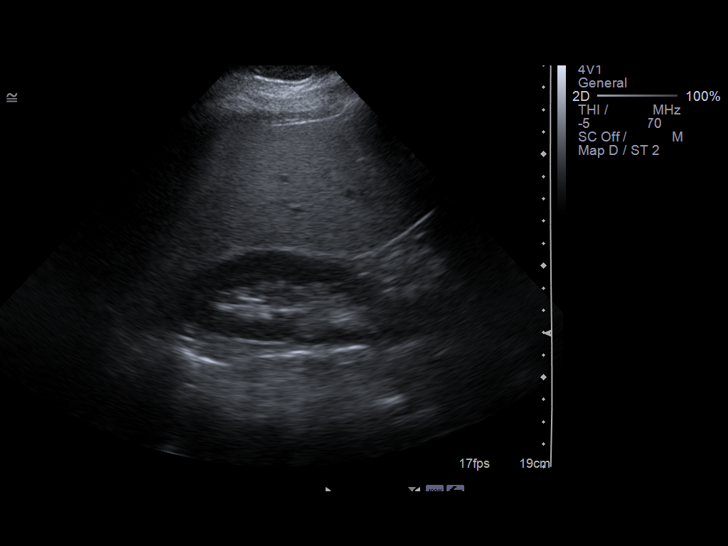
[im 50/93]
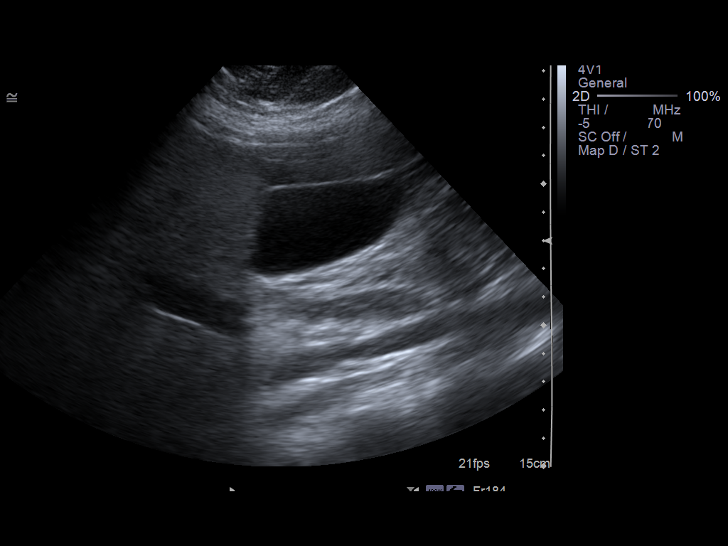
[im 58/93]
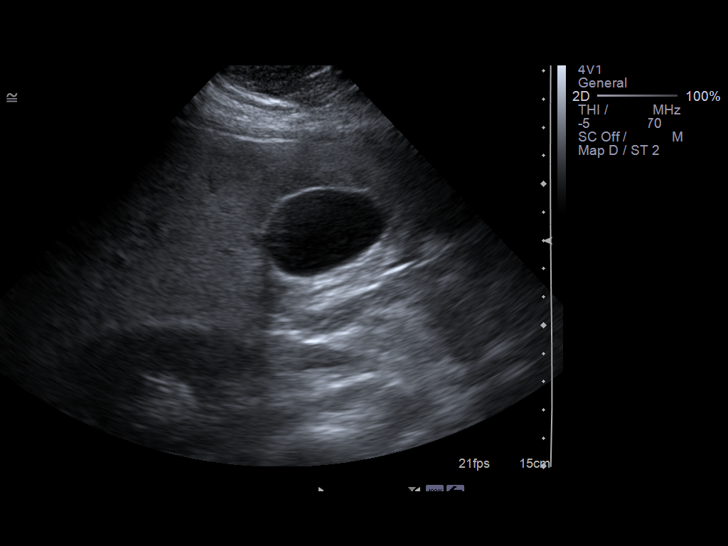
[im 62/93]
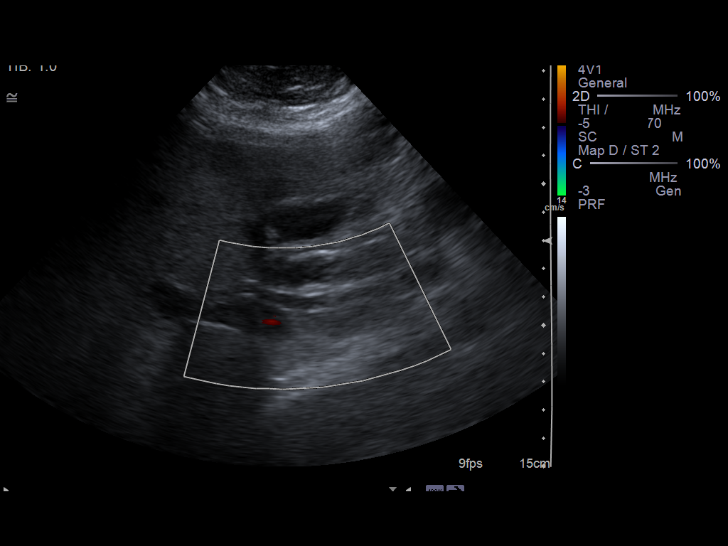
[im 70/93]
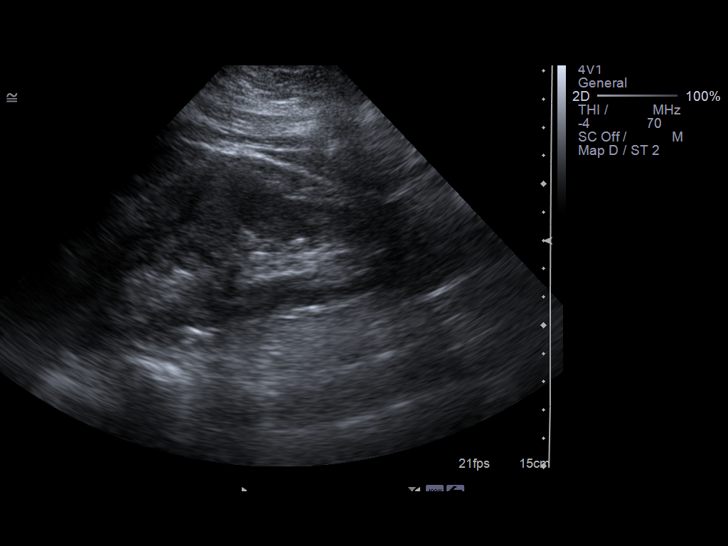
[im 77/93]
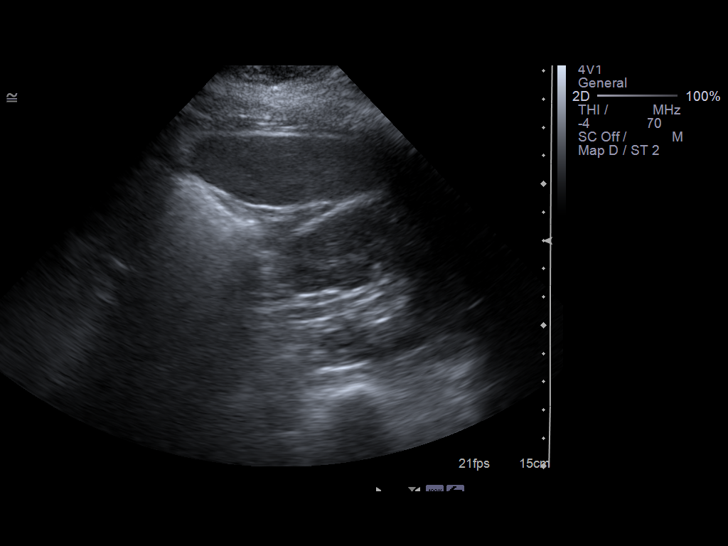
[im 85/93]
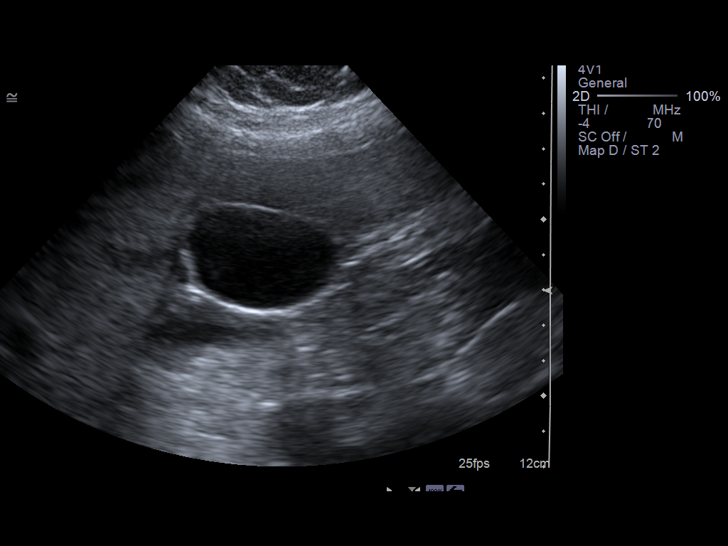
[im 93/93]
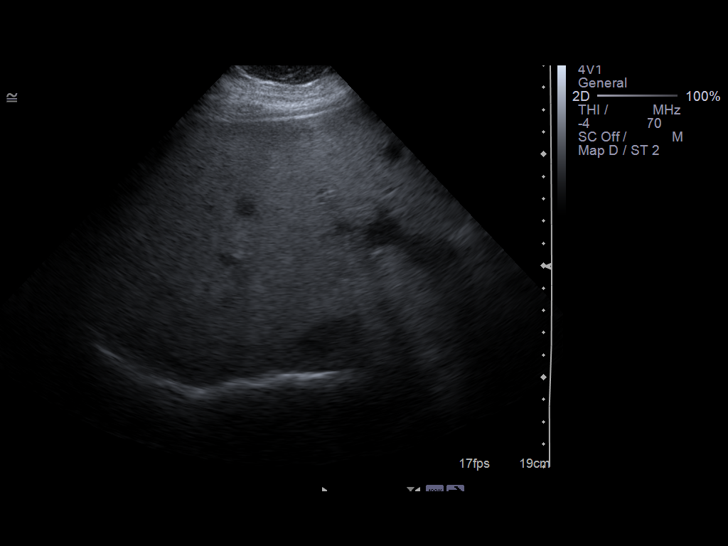

[14 of 25 positions shown; findings below may reference images not displayed]

FINDINGS: Gallbladder:  The gallbladder is visualized and no gallstones are
noted.

Common bile duct:  The common bile duct is normal measuring 5.3 mm
in diameter.

Liver:  The liver is slightly echogenic suggesting possible mild
fatty infiltration.  No focal abnormality is seen.  No ductal
dilatation is noted.

IVC:  Appears normal.

Pancreas:  No focal abnormality seen.

Spleen:  The spleen is normal measuring 7.0 cm sagittally.

Right Kidney:  No hydronephrosis is seen.  The right kidney
measures 11.5 cm sagittally.

Left Kidney:  No hydronephrosis.  The left kidney measures 12.6 cm

Abdominal aorta:  The abdominal aorta is normal caliber.
IMPRESSION: 1.  No gallstones.  No ductal dilatation.  No
2.  Somewhat echogenic liver suggests fatty infiltration.

## 2010-11-02 ENCOUNTER — Ambulatory Visit: Payer: BC Managed Care – PPO | Admitting: Family Medicine

## 2010-11-02 LAB — BASIC METABOLIC PANEL
BUN: 4 mg/dL — ABNORMAL LOW (ref 6–23)
BUN: 9 mg/dL (ref 6–23)
CO2: 29 mEq/L (ref 19–32)
Calcium: 8.8 mg/dL (ref 8.4–10.5)
Chloride: 104 mEq/L (ref 96–112)
Creatinine, Ser: 0.85 mg/dL (ref 0.4–1.2)
GFR calc non Af Amer: >60 mL/min (ref >60)
Glucose, Bld: 99 mg/dL (ref 70–99)
Potassium: 3.8 mEq/L (ref 3.5–5.1)

## 2010-11-02 LAB — CBC
HCT: 40.7 % (ref 36.0–46.0)
MCH: 30.2 pg (ref 26.0–34.0)
MCHC: 32.8 g/dL (ref 30.0–36.0)
MCV: 91.9 fL (ref 78.0–100.0)
MCV: 92.2 fL (ref 78.0–100.0)
Platelets: 179 10*3/uL (ref 150–400)
RBC: 4.43 MIL/uL (ref 3.87–5.11)
RDW: 13.2 % (ref 11.5–15.5)
RDW: 13.2 % (ref 11.5–15.5)
WBC: 7.2 10*3/uL (ref 4.0–10.5)

## 2010-11-02 LAB — URINALYSIS, ROUTINE W REFLEX MICROSCOPIC
Bilirubin Urine: NEGATIVE
Glucose, UA: NEGATIVE mg/dL
Hgb urine dipstick: NEGATIVE
Ketones, ur: NEGATIVE mg/dL
Protein, ur: NEGATIVE mg/dL
Urobilinogen, UA: 0.2 mg/dL (ref 0.0–1.0)

## 2010-11-02 LAB — COMPREHENSIVE METABOLIC PANEL
ALT: 23 U/L (ref 0–35)
Albumin: 3.2 g/dL — ABNORMAL LOW (ref 3.5–5.2)
Albumin: 4 g/dL (ref 3.5–5.2)
Alkaline Phosphatase: 93 U/L (ref 39–117)
BUN: 10 mg/dL (ref 6–23)
BUN: 12 mg/dL (ref 6–23)
Calcium: 8.5 mg/dL (ref 8.4–10.5)
Chloride: 105 mEq/L (ref 96–112)
Chloride: 108 mEq/L (ref 96–112)
Creatinine, Ser: 0.82 mg/dL (ref 0.4–1.2)
Glucose, Bld: 110 mg/dL — ABNORMAL HIGH (ref 70–99)
Potassium: 3.8 mEq/L (ref 3.5–5.1)
Sodium: 136 mEq/L (ref 135–145)
Total Bilirubin: 0.3 mg/dL (ref 0.3–1.2)
Total Bilirubin: 0.4 mg/dL (ref 0.3–1.2)
Total Protein: 6 g/dL (ref 6.0–8.3)
Total Protein: 7.2 g/dL (ref 6.0–8.3)

## 2010-11-02 LAB — DIFFERENTIAL
Basophils Absolute: 0 10*3/uL (ref 0.0–0.1)
Basophils Absolute: 0 10*3/uL (ref 0.0–0.1)
Basophils Relative: 1 % (ref 0–1)
Eosinophils Absolute: 0.1 10*3/uL (ref 0.0–0.7)
Lymphocytes Relative: 53 % — ABNORMAL HIGH (ref 12–46)
Lymphs Abs: 2.4 10*3/uL (ref 0.7–4.0)
Monocytes Absolute: 0.3 10*3/uL (ref 0.1–1.0)
Monocytes Absolute: 0.5 10*3/uL (ref 0.1–1.0)
Monocytes Relative: 7 % (ref 3–12)
Neutro Abs: 1.7 10*3/uL (ref 1.7–7.7)
Neutrophils Relative %: 45 % (ref 43–77)

## 2010-11-02 LAB — URINE MICROSCOPIC-ADD ON

## 2010-11-06 ENCOUNTER — Ambulatory Visit (INDEPENDENT_AMBULATORY_CARE_PROVIDER_SITE_OTHER): Payer: BC Managed Care – PPO | Admitting: Family Medicine

## 2010-11-06 DIAGNOSIS — I10 Essential (primary) hypertension: Secondary | ICD-10-CM

## 2010-11-06 DIAGNOSIS — R5383 Other fatigue: Secondary | ICD-10-CM

## 2010-11-06 DIAGNOSIS — R5381 Other malaise: Secondary | ICD-10-CM

## 2010-11-06 NOTE — Progress Notes (Signed)
Medical Nutrition Therapy:  Appt start time: 1100 end time:  1200.  Assessment:  Primary concerns today: Weight management and hypertension. [name] said she has been unable to lose wt despite efforts.  Since Jan she has cut back on Mtn Dew to only 20 oz 3 days a week (used to have 20-40 oz per day).  She c/o extreme fatigue as well as chronic constipation.  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  She gets to bed by 9 AM w/ sleep mask; usually up again ~3 PM.  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  Usual eating pattern includes bkfast at 6 AM, lunch at 6 PM, and another meal at 1 AM;  snacks at 3 PM (usually pb & j sandw), 9:30 PM (packaged snack food), 3 AM snack Jell-O, Jell-O pudding, OR pb Nab's of 6 AM snack of Jell-O, Jell-O pudding, AND pb Nab's.  When asked when she feels physical hunger, she was unsure.  Favorite foods include sweets, bread, salty foods.  Avoided foods/beverages include water (drinks only ~1 cup/day).  Currently no regular physical activity due to time constraints, lack of energy, and pain.  She said she has to take demerol and indomethacin just to be able to function at work.  24-hr recall suggests intake of ~2600 kcal: B (7:30 AM)- [employer]jangles sausage biscuit, 8 oz McD's swt tea; Snk (10 AM)- 1 pancake w/ sugar-free syrup, sug-free Kool-Aid; L (1 PM)- hot dog w/ must, chili, slaw, 10 oz Mtn Dew; D ( PM)- 3 fried chx wings, 2 slc cheddar chs, 6 Ritz crkrs, 1/2 c chx salad, 1/4 c broccoli, 1 baby carrots, 2 slc cucmbers, cake w/ icing, 1 c cut fruit (pineapple, cantal, honeydew, grapes), sug-free punch; Snk (8 PM)- 1 c chx pot pie, 20 oz swt tea.  Yesterday was atypical b/c it included a meal at church instead of at home.    Progress  Towards Goal(s):  In progress.   Nutritional Diagnosis:   NB-2.1 Physical inactivity As related to time constraints and pain .  As evidenced by no regular exercise. NI-1.5 Excessive energy intake As related to diet composition and eating frequency.  As evidenced by elevated BMI.   Intervention:  Nutrition education.   Monitoring/Evaluation:  Dietary intake in 3 week(s).

## 2010-11-06 NOTE — Patient Instructions (Addendum)
-   Continue 3 discrete meals a day, but reduce your eating frequency.  You should be eating no more than approximately every 4 hours.   - Pay attention to your hunger level.  Eat only when hungry UNLESS if you don't eat soon, you won't get a chance to eat till you're likely to get over-hungry.   - A REAL meal looks like, tastes like, and feels like a REAL MEAL.   - Obtain twice as many veg's as protein or carbohydrate foods for both lunch and dinner. - Limit carb foods to NO more than 1/4 of your total volume of food! - Get some good support hose and supportive shoes, and wear these always, so you can walk and stand as often as your job allows!  - What you need to change for optimal weight management: 1. Sleep more.  2. Drink more water, 6-8 cups per day. (Taste preferences are learned.)  Limit sweet drinks.   3. Distribute calories evenly thru the day: Advance planning is essential!  Choose meals and snacks mindfully.   4. Increase physical activity, which for you right now, means moving more during the day.   5. Decrease intake of fat, sugar, and refined carbohydrate foods.  When you have rice or noodles, mix in veg's with it.   6. Keep you eye on the prize:  Why are you working on this? Consider keeping a journal in which you record your food intake and writing about the process.    - Ask your daughter to join you at your next nutrition appt.   - Call [employer] to make sure medical nutrition therapy with Dr. Gerilyn Pilgrim at [facility] is covered.  (Diagnosis is 401.1, fatigue, chronic constipation, and 278.00.)  Call Dr. Gerilyn Pilgrim to let me know if there is any problem with coverage:  [phone].

## 2010-11-20 LAB — URINALYSIS, ROUTINE W REFLEX MICROSCOPIC
Ketones, ur: NEGATIVE mg/dL
Nitrite: NEGATIVE
Protein, ur: NEGATIVE mg/dL
Urobilinogen, UA: 0.2 mg/dL (ref 0.0–1.0)

## 2010-11-20 LAB — URINE CULTURE

## 2010-11-21 LAB — CBC
HCT: 33.5 % — ABNORMAL LOW (ref 36.0–46.0)
Hemoglobin: 11.3 g/dL — ABNORMAL LOW (ref 12.0–15.0)
MCHC: 33.3 g/dL (ref 30.0–36.0)
RBC: 3.55 MIL/uL — ABNORMAL LOW (ref 3.87–5.11)
RDW: 13.2 % (ref 11.5–15.5)

## 2010-11-21 LAB — URINE CULTURE
Colony Count: >=100000
Colony Count: NO GROWTH
Special Requests: POSITIVE

## 2010-11-21 LAB — BASIC METABOLIC PANEL
CO2: 24 mEq/L (ref 19–32)
GFR calc Af Amer: >60 mL/min (ref >60)
GFR calc non Af Amer: >60 mL/min (ref >60)
Glucose, Bld: 127 mg/dL — ABNORMAL HIGH (ref 70–99)
Potassium: 4.5 mEq/L (ref 3.5–5.1)
Sodium: 136 mEq/L (ref 135–145)

## 2010-11-21 LAB — URINALYSIS, ROUTINE W REFLEX MICROSCOPIC
Glucose, UA: NEGATIVE mg/dL
Ketones, ur: NEGATIVE mg/dL
Nitrite: NEGATIVE
Protein, ur: 30 mg/dL — AB

## 2010-11-21 LAB — URINE MICROSCOPIC-ADD ON

## 2010-11-22 LAB — COMPREHENSIVE METABOLIC PANEL
ALT: 22 U/L (ref 0–35)
AST: 16 U/L (ref 0–37)
Albumin: 3.8 g/dL (ref 3.5–5.2)
Alkaline Phosphatase: 68 U/L (ref 39–117)
CO2: 26 mEq/L (ref 19–32)
Chloride: 107 mEq/L (ref 96–112)
Creatinine, Ser: 0.76 mg/dL (ref 0.4–1.2)
GFR calc Af Amer: >60 mL/min (ref >60)
GFR calc non Af Amer: >60 mL/min (ref >60)
Potassium: 3.9 mEq/L (ref 3.5–5.1)
Sodium: 138 mEq/L (ref 135–145)
Total Bilirubin: 0.4 mg/dL (ref 0.3–1.2)

## 2010-11-22 LAB — CBC
Platelets: 201 10*3/uL (ref 150–400)
RBC: 4.4 MIL/uL (ref 3.87–5.11)
WBC: 5.5 10*3/uL (ref 4.0–10.5)

## 2010-11-22 LAB — ABO/RH: ABO/RH(D): A POS

## 2010-11-22 LAB — DIFFERENTIAL
Basophils Absolute: 0 10*3/uL (ref 0.0–0.1)
Eosinophils Absolute: 0.1 10*3/uL (ref 0.0–0.7)
Eosinophils Relative: 2 % (ref 0–5)
Lymphocytes Relative: 46 % (ref 12–46)
Monocytes Absolute: 0.5 10*3/uL (ref 0.1–1.0)

## 2010-11-22 LAB — CA 125: CA 125: 5.8 U/mL (ref 0.0–30.2)

## 2010-11-23 ENCOUNTER — Other Ambulatory Visit: Payer: Self-pay | Admitting: Family Medicine

## 2010-11-23 DIAGNOSIS — K589 Irritable bowel syndrome without diarrhea: Secondary | ICD-10-CM

## 2010-11-27 ENCOUNTER — Ambulatory Visit: Payer: BC Managed Care – PPO | Admitting: Family Medicine

## 2010-12-01 ENCOUNTER — Other Ambulatory Visit: Payer: Self-pay | Admitting: Family Medicine

## 2010-12-22 ENCOUNTER — Ambulatory Visit (INDEPENDENT_AMBULATORY_CARE_PROVIDER_SITE_OTHER): Payer: BC Managed Care – PPO | Admitting: Sports Medicine

## 2010-12-22 ENCOUNTER — Ambulatory Visit: Payer: BC Managed Care – PPO | Admitting: Family Medicine

## 2010-12-22 ENCOUNTER — Encounter: Payer: Self-pay | Admitting: Sports Medicine

## 2010-12-22 VITALS — BP 136/82 | HR 88 | Temp 97.4°F | Ht 66.0 in | Wt 296.0 lb

## 2010-12-22 DIAGNOSIS — Z0000 Encounter for general adult medical examination without abnormal findings: Secondary | ICD-10-CM

## 2010-12-22 DIAGNOSIS — Z23 Encounter for immunization: Secondary | ICD-10-CM

## 2010-12-22 LAB — RUBELLA SCREEN: Rubella: 251.6 IU/mL — ABNORMAL HIGH

## 2010-12-22 NOTE — Progress Notes (Signed)
  Subjective:    Patient ID: [name], female    DOB: 1960/10/13, 50 y.o.   MRN: [id number]  HPI Pt is here for school form.  Review of Systems    neg except as in HPI. Objective:   Physical Exam  Constitutional: She appears well-developed and well-nourished. No distress.  Skin: Skin is warm and dry.  Pt has had complete examination within 1 year, exam deferred this visit.      Assessment & Plan:

## 2010-12-22 NOTE — Progress Notes (Signed)
Addended by: Loralee Pacas on: 12/22/2010 12:12 PM   Modules accepted: Orders

## 2010-12-22 NOTE — Patient Instructions (Signed)
Great to see you, Filled out form. Checking bloodwork to document immunity. Will mail to you when results are in. Good luck in school!  [name]. Benjamin Stain, M.D.

## 2010-12-22 NOTE — Assessment & Plan Note (Signed)
Exam noted normal on several occasions within 1 year. Pt does not have documentation of MMR, HepB, Varicella immunity/vaccinations. Will check IgG levels for the above.

## 2010-12-25 ENCOUNTER — Ambulatory Visit: Payer: BC Managed Care – PPO | Admitting: *Deleted

## 2010-12-25 ENCOUNTER — Encounter: Payer: Self-pay | Admitting: *Deleted

## 2010-12-25 ENCOUNTER — Encounter: Payer: Self-pay | Admitting: Sports Medicine

## 2010-12-25 ENCOUNTER — Telehealth: Payer: Self-pay | Admitting: *Deleted

## 2010-12-25 LAB — MUMPS ANTIBODY, IGG: Mumps IgG: 2.32 {ISR} — ABNORMAL HIGH

## 2010-12-25 LAB — VARICELLA ZOSTER ANTIBODY, IGG: Varicella IgG: 2.19 {ISR} — ABNORMAL HIGH

## 2010-12-25 NOTE — Telephone Encounter (Signed)
LVM for patient to call back concerning below

## 2010-12-25 NOTE — Telephone Encounter (Signed)
Message copied by Jimmy Footman on Mon Dec 25, 2010  5:05 PM ------      Message from: Rodney Langton      Created: Mon Dec 25, 2010  4:45 PM      Regarding: Test results.       Please have Solimar pick up the letter from the front re: her immunity.      Ihor Austin. Benjamin Stain, M.D.

## 2010-12-25 NOTE — Progress Notes (Signed)
Patient came in today to have PPD read and had own paperwork with her. PPD was neg on left arm.[name]

## 2010-12-26 ENCOUNTER — Ambulatory Visit (INDEPENDENT_AMBULATORY_CARE_PROVIDER_SITE_OTHER): Payer: BC Managed Care – PPO | Admitting: Family Medicine

## 2010-12-26 ENCOUNTER — Ambulatory Visit: Payer: BC Managed Care – PPO

## 2010-12-26 ENCOUNTER — Encounter: Payer: Self-pay | Admitting: Family Medicine

## 2010-12-26 VITALS — BP 146/84 | HR 92 | Temp 98.3°F | Wt 290.0 lb

## 2010-12-26 DIAGNOSIS — M1990 Unspecified osteoarthritis, unspecified site: Secondary | ICD-10-CM | POA: Insufficient documentation

## 2010-12-26 DIAGNOSIS — K219 Gastro-esophageal reflux disease without esophagitis: Secondary | ICD-10-CM

## 2010-12-26 DIAGNOSIS — M064 Inflammatory polyarthropathy: Secondary | ICD-10-CM

## 2010-12-26 LAB — SEDIMENTATION RATE: Sed Rate: 1 mm/hr (ref 0–22)

## 2010-12-26 MED ORDER — PREDNISONE 20 MG PO TABS: 40.0000 mg | ORAL_TABLET | Freq: Every day | ORAL | Status: AC

## 2010-12-26 MED ORDER — OXYCODONE-ACETAMINOPHEN 7.5-325 MG PO TABS: 1.0000 | ORAL_TABLET | ORAL | Status: DC | PRN

## 2010-12-26 MED ORDER — PROMETHAZINE HCL 25 MG PO TABS: 25.0000 mg | ORAL_TABLET | Freq: Four times a day (QID) | ORAL | Status: DC | PRN

## 2010-12-26 MED ORDER — SUCRALFATE 1 G PO TABS: 1.0000 g | ORAL_TABLET | Freq: Four times a day (QID) | ORAL | Status: DC

## 2010-12-26 NOTE — Patient Instructions (Signed)
We will get some blood work today to evaluate for other causes of arthritis Take Prednisone for the next 5 days to help ease off the pain You can also take Percocet as needed Try Carafate for your stomach upset.

## 2010-12-26 NOTE — Assessment & Plan Note (Signed)
I think that she may have some form of inflammatory arthritis with the multiple joints involved some redness and swelling.  Will get basic panel include uric acid, sed rate, ANA, and RF.  Will give Prednisone burst since that has seemed to help in the past.  Will give Percocet for pain control.

## 2010-12-26 NOTE — Telephone Encounter (Signed)
LVM for patient to call back. ?

## 2010-12-26 NOTE — Progress Notes (Signed)
  Subjective:    [name], female    DOB: Nov 12, 1960, 50 y.o.   MRN: [id number]  HPI Joint pains:  Pt is here for follow up on joint pains.   She has had these pains for about 6 months and they are getting worse.  She is followed by her PCP and [facility].  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  She has had steroid injections and hyaluranic acid injections in the knees.  The steroid injections help for a couple of months.  She is having pain in her knees, ankles, wrists, and fingers.  She does feel that her wrists and fingers are swollen.  She also complains of just hurting all over.  She has been taking Demerol and Indomethacin.  She can't take Indomethacin because of stomach upset.  She doesn't think Vicodin helps.  Upset stomach:  She has burning and gurgling in her stomach.  She has been taking Omeprazole which helps some.     Review of Systems Denies fevers, chills, cough, rash, diarrhea, n/v, rectal bleeding    Objective:   Physical Exam  Constitutional: She appears well-developed.       Morbidly obese  Eyes: Conjunctivae are normal. No scleral icterus.  Neck: Normal range of motion. Neck supple. No thyromegaly present.  Cardiovascular: Normal rate, regular rhythm, normal heart sounds and intact distal pulses.   Pulmonary/Chest: Effort normal and breath sounds normal.  Abdominal: Soft. Bowel sounds are normal. She exhibits no distension. There is no tenderness. There is no rebound and no guarding.  Musculoskeletal:       Back: mild diffuse lumbar TTP. Trace lower extremity edema.  Wrists: slightly swollen and red. Hands: slightly swollen and red. No bony joint changes or TTP.  Knee: no erythema, warmth, TTP along medial joint line.  Decreased ROM due to pain, swelling bilaterally.   Skin:       Hyperpigmented macular areas under armpits bilaterally and under breasts.   Psychiatric: Her speech is normal. Her affect is not angry, not  blunt, not labile and not inappropriate. Thought content is not paranoid and not delusional. Cognition and memory are normal. Cognition and memory are not impaired. She does not express impulsivity or inappropriate judgment. She does not exhibit a depressed mood. She expresses no homicidal and no suicidal ideation. She exhibits normal recent memory and normal remote memory.       Mildly anxious.           Assessment & Plan:

## 2010-12-26 NOTE — Assessment & Plan Note (Signed)
Dyspepsia continues.  More of a complaint of burning and discomfort.  Normal abdominal exam.  Will also start Carafate to see if that helps.

## 2010-12-27 ENCOUNTER — Encounter: Payer: Self-pay | Admitting: Family Medicine

## 2010-12-27 LAB — ANA: Anti Nuclear Antibody(ANA): NEGATIVE

## 2010-12-27 NOTE — Telephone Encounter (Signed)
I will mail the letter to patient's house due to not being able to reach her

## 2010-12-28 ENCOUNTER — Encounter: Payer: Self-pay | Admitting: Family Medicine

## 2010-12-28 ENCOUNTER — Telehealth: Payer: Self-pay | Admitting: Family Medicine

## 2010-12-28 NOTE — Telephone Encounter (Signed)
Why is this sent to me?  I didn't see her on Tuesday or order any arthritis tests.

## 2010-12-28 NOTE — Telephone Encounter (Signed)
Is asking to speak with [name] about her test results from Tuesday

## 2010-12-28 NOTE — Telephone Encounter (Signed)
Spoke with patient and informed her that I sent the letter to her due to being unable to reach her.  [name], She was wanting to know specifically about arthritis test that says were supposedly done on Tuesday. ---[place] Dec

## 2011-01-01 ENCOUNTER — Ambulatory Visit (INDEPENDENT_AMBULATORY_CARE_PROVIDER_SITE_OTHER): Payer: BC Managed Care – PPO | Admitting: *Deleted

## 2011-01-01 ENCOUNTER — Encounter: Payer: Self-pay | Admitting: Family Medicine

## 2011-01-01 ENCOUNTER — Ambulatory Visit (INDEPENDENT_AMBULATORY_CARE_PROVIDER_SITE_OTHER): Payer: BC Managed Care – PPO | Admitting: Family Medicine

## 2011-01-01 VITALS — BP 143/82 | HR 91 | Temp 97.7°F | Wt 296.0 lb

## 2011-01-01 DIAGNOSIS — Z111 Encounter for screening for respiratory tuberculosis: Secondary | ICD-10-CM

## 2011-01-01 DIAGNOSIS — M1990 Unspecified osteoarthritis, unspecified site: Secondary | ICD-10-CM

## 2011-01-01 DIAGNOSIS — Z23 Encounter for immunization: Secondary | ICD-10-CM

## 2011-01-01 DIAGNOSIS — M064 Inflammatory polyarthropathy: Secondary | ICD-10-CM

## 2011-01-01 LAB — COMPREHENSIVE METABOLIC PANEL
Albumin: 4.1 g/dL (ref 3.5–5.2)
CO2: 25 mEq/L (ref 19–32)
Calcium: 9 mg/dL (ref 8.4–10.5)
Glucose, Bld: 135 mg/dL — ABNORMAL HIGH (ref 70–99)
Potassium: 3.6 mEq/L (ref 3.5–5.3)
Sodium: 141 mEq/L (ref 135–145)
Total Protein: 6.4 g/dL (ref 6.0–8.3)

## 2011-01-01 LAB — CBC
HCT: 40.7 % (ref 36.0–46.0)
Hemoglobin: 13.8 g/dL (ref 12.0–15.0)
RBC: 4.47 MIL/uL (ref 3.87–5.11)

## 2011-01-01 MED ORDER — ALLOPURINOL 100 MG PO TABS: ORAL_TABLET | ORAL | Status: DC

## 2011-01-01 MED ORDER — PREDNISONE 5 MG PO TABS: 5.0000 mg | ORAL_TABLET | Freq: Every day | ORAL | Status: AC

## 2011-01-01 NOTE — Patient Instructions (Addendum)
We are going to treat you for gout Stat taking Allopurinol, 1 tab by mouth daily for 1 week and then increase to 2 tabs by mouth daily Until Allopurinol builds up, take low dose Prednisone for the next 10 days We will get some blood work today and follow up your results in 6 weeks Please schedule a follow up appointment in 6 weeks I will check on the forms that you need to have filled out and make sure Dr. Madolyn Frieze completes them for you

## 2011-01-01 NOTE — Progress Notes (Signed)
Patient in for 2nd PPD for 2 step  PPD.  However patient received first PPD on 12/22/2010. Advised she needs to wait for total of two weeks to receive the 2nd PPD. She will return on 01/05/2011.

## 2011-01-01 NOTE — Assessment & Plan Note (Signed)
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  She improved dramatically with the Prednisone.  It is polyarticular which would make gout less likely however, that was the only lab that was slightly elevated.  Will put her on a uric acid lowering agent (Allopurinol) and see if that helps.  Will check CBC and CMET for baseline.  Will also bridge her with low dose Prednisone since she doesn't tolerate NSAIDs well.  If not improved with Allopurinol would send her to rheumatology for further workup (Dr. Thayer Jew) at [facility] would be a good choice since she is already established there.

## 2011-01-01 NOTE — Progress Notes (Signed)
  Subjective:    [name], female    DOB: 1961-06-07, 50 y.o.   MRN: [id number]  Knee Pain    Joint pains:   Last visit: Pt is here for follow up on joint pains.   She has had these pains for about 6 months and they are getting worse.  She is followed by her PCP and [facility].  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  She has had steroid injections and hyaluranic acid injections in the knees.  The steroid injections help for a couple of months.  She is having pain in her knees, ankles, wrists, and fingers.  She does feel that her wrists and fingers are swollen.  She also complains of just hurting all over.  She has been taking Demerol and Indomethacin.  She can't take Indomethacin because of stomach upset.  She doesn't think Vicodin helps.  This visit:  She feels so much better today.  She denies any active joint pains.  She didn't take the Percocet because it upset her stomach.  The prednisone really helped after the 3rd day.  She was able to go about her normal activities without any pain.  Reviewed labs with patient   Review of Systems  Denies fevers, chills, cough, rash, diarrhea, n/v, rectal bleeding    Objective:   Physical Exam  Constitutional: She appears well-developed.       Morbidly obese  Eyes: Conjunctivae are normal. No scleral icterus.  Neck: Normal range of motion. Neck supple. No thyromegaly present.  Cardiovascular: Normal rate, regular rhythm, normal heart sounds and intact distal pulses.   Pulmonary/Chest: Effort normal and breath sounds normal.  Abdominal: Soft. Bowel sounds are normal. She exhibits no distension. There is no tenderness. There is no rebound and no guarding.  Musculoskeletal:       Back: mild diffuse lumbar TTP. Trace lower extremity edema.  Wrists: not swollen and red. Hands: not swollen and red. No bony joint changes or TTP.  Knee: no erythema, warmth, TTP along medial joint line.  Improved ROM     Skin:       Hyperpigmented macular areas under armpits bilaterally and under breasts.   Psychiatric: Her speech is normal. Her affect is not angry, not blunt, not labile and not inappropriate. Thought content is not paranoid and not delusional. Cognition and memory are normal. Cognition and memory are not impaired. She does not express impulsivity or inappropriate judgment. She does not exhibit a depressed mood. She expresses no homicidal and no suicidal ideation. She exhibits normal recent memory and normal remote memory.       Mildly anxious.           Assessment & Plan:

## 2011-01-02 ENCOUNTER — Telehealth: Payer: Self-pay | Admitting: Family Medicine

## 2011-01-02 NOTE — Telephone Encounter (Signed)
Patient has appointment with Dr. Lelon Perla on 05/16 to get this form filled since I had no openings.  She has been informed of appointment.

## 2011-01-05 ENCOUNTER — Ambulatory Visit: Payer: BC Managed Care – PPO | Admitting: *Deleted

## 2011-01-05 DIAGNOSIS — Z111 Encounter for screening for respiratory tuberculosis: Secondary | ICD-10-CM

## 2011-01-05 MED ORDER — TUBERCULIN PPD 5 UNIT/0.1ML ID SOLN: 5.0000 [IU] | Freq: Once | INTRADERMAL | Status: DC

## 2011-01-05 NOTE — Op Note (Signed)
[name], [name]              ACCOUNT NO.:  [id number]   MEDICAL RECORD NO.:  [id number]          PATIENT TYPE:  OIB   LOCATION:  3172                         FACILITY:  [facility]   PHYSICIAN:  Tia Alert, MD     DATE OF BIRTH:  1960-09-10   DATE OF PROCEDURE:  05/23/2005  DATE OF DISCHARGE:                                 OPERATIVE REPORT   PREOPERATIVE DIAGNOSES:  Left foraminal and extraforaminal disk herniation  L5-S1, with left L5 radiculopathy and dorsiflexor weakness.   POSTOPERATIVE DIAGNOSES:  Left foraminal and extraforaminal disk herniation  L5-S1, with left L5 radiculopathy and dorsiflexor weakness.   PROCEDURES:  Lumbar hemilaminectomy, medial facetectomy and foraminotomy, L5-  S1 on the left, for decompression of the left L5 and S1 nerve roots, and  microdiskectomy, L5-S1 on the left utilizing microscopic dissection.   SURGEON:  [name], M.D.   ASSISTANT:  'Donalee Citrin, M.D.   ANESTHESIA:  General endotracheal.   COMPLICATIONS:  None apparent.   INDICATIONS FOR PROCEDURE:  [name] is a very pleasant 50 year old black  female who presented with a left L5 radiculopathy with left foraminal  stenosis from a foraminal and extraforaminal disk herniation.  She underwent  epidural steroid injection without significant relief. She had some weakness  of her left dorsiflexors with severe pain in her left L5 distribution. Her  MRI showed the foraminal and extraforaminal disk.  Plain films showed some  degenerative disease at L5-S1. We originally planned to do a left  extraforaminal diskectomy;  however, we felt this would be very difficult  because of her hip on the left side with very little space to work through,  and we felt it was better to proceed through a more midline approach  decompressing the  left L5 and S1 nerve roots.  She understood the risks,  benefits, and outcome and wished to proceed.   DESCRIPTION OF PROCEDURE:  The patient was taken to the  operating room and  after induction of adequate general endotracheal anesthesia, she was rolled  into the prone position on the Wilson frame, and all pressure points were  padded. Her lumbar region was prepped with DuraPrep and then draped in the  usual sterile fashion. Ten cc of local anesthesia was injected. and a dorsal  midline incision was made and carried down to the lumbosacral fascia. The  fascia was opened, and the paraspinous musculature was taken down in a  subperiosteal fashion to expose the L5-S1 interspace on the left side.  Intraoperative x-ray confirmed my level at L5-S1, and then a  hemilaminectomy, medial facetectomy, and foraminotomy was performed at L5-S1  on the left.  We performed a very wide foraminotomy. The underlying yellow  ligament was removed to expose the underlying S1 nerve root. I dissected and  identified the L5 nerve root coming out into the pedicle. This was quite  tight around the nerve root, and we were very careful using a 2-mm Kerrison  punch to decompress out into the facet and decompress the L5 nerve root. She  did have a significant subannular disk herniation which was  removed. It was  incised, and the diskectomy was performed with pituitary rongeurs. We did  pull out a  significant fragment from the foraminal space.  Once the  decompression was complete, we palpated to assure adequate decompression of  the L5 and S1 nerve roots. We irrigated with saline solution, inspected the  nerve roots once again. We were able to pass nerve hooks very easily around  the L5 nerve root. We felt like we had a good decompression of the root. We  then placed Depo-Medrol over the exposed nerve roots along with Gelfoam,  closed the fascia with interrupted #1 Vicryl, closed the subcutaneous and  subcuticular tissues with 2-0 and 3-0 Vicryl, and closed skin with  Dermabond. The drapes were removed. A sterile dressing was applied. The  patient was awakened from general  anesthesia and transferred to the recovery  room in stable condition. At the end of procedure all sponge, needle and  instrument counts were correct.      Tia Alert, MD  Electronically Signed     DSJ/MEDQ  D:  05/23/2005  T:  05/23/2005  Job:  [id number]

## 2011-01-05 NOTE — H&P (Signed)
NAME:  [name], [name]                          ACCOUNT NO.:  [id number]   MEDICAL RECORD NO.:  [id number]                   PATIENT TYPE:   LOCATION:                                       FACILITY:   PHYSICIAN:  Hal Morales, M.D.             DATE OF BIRTH:   DATE OF ADMISSION:  10/21/2002  DATE OF DISCHARGE:                                HISTORY & PHYSICAL   HISTORY OF PRESENT ILLNESS:  The patient is a 50 year old single African-  American female, para 1, 2, 0, 3, with a history of uterine fibroids, who  presents for a hysterectomy because of pelvic pain and menorrhagia.  The  patient reports that over the past several years, she experiences almost  constant crampy pelvic pain, accompanied by pressure, which she rates as a  10/10 on a 10-point scale.  This discomfort which often times interferes  with her ability to ambulate, initially was managed with Demerol and  Phenergan.  The patient eventually, however, developed increased pain which  would only be curtailed with Dilaudid 2 mg, and only to the level of a 6/10  on a 10-point scale for pain.  Additionally, the patient has had for many  years twice-monthly periods with clots, with which she changes super plus  tampons and a pad every two hours.  The patient as a result has been anemic,  with a hemoglobin as low as 9.2.  In the past, the patient was tried on oral  contraceptives, but found that this had no effect on her bleeding pattern,  nor on her cramping.  A pelvic ultrasound on September 01, 2002, showed  markedly enlarged uterus with multiple fibroids (which were difficult to  measure individually), with normal ovaries, no pelvic fluid or  hydronephrosis.  The patient had a normal TSH in June 2002.  After a  consideration of medical and surgical options for the management of her  symptoms, the patient has decided to undergo a total abdominal hysterectomy.   PAST MEDICAL HISTORY:  OB HISTORY:  Gravida 3, para 1, 2, 0,  3.  The patient  experienced anemia during pregnancy and a cesarean section with her last  pregnancy.  GYN HISTORY:  Menarche at 50 years of age.  Menses are as per the history of  present illness.  The patient uses laparoscopic tubal cautery as her method  of contraception.  She has a history of Chlamydia (negative gonorrhea and  Chlamydia cultures in January  2004).  A remote history of abnormal Pap  smear,with her last Pap smear in February 2004, being normal.   PAST MEDICAL HISTORY:  Positive for anemia and irritable bowel syndrome.   PAST SURGICAL HISTORY:  1. In 1989, laparoscopic tubal cautery and a cesarean section.  2. In 1999, bilateral carpal tunnel release.  The patient reports that     anesthesia causes her severe vomiting.  She denies any history  of blood     transfusions.   FAMILY HISTORY:  Positive for insulin-dependent diabetes mellitus,  hypertension, elevated cholesterol, coronary artery disease and colon  cancer.   SOCIAL HISTORY:  The patient is single.  She has three children, and she  works as a Education administrator.  HABITS:  She does not use tobacco  or alcohol.   MEDICATIONS:  1. Iron b.i.d.  2. A multivitamin.   ALLERGIES:  No known drug allergies.   REVIEW OF SYSTEMS:  Positive for Pica, otherwise negative.   PHYSICAL EXAMINATION:  VITAL SIGNS:  Blood pressure 134/60, weight 284  pounds, height 5 feet 7 inches tall.  NECK:  Supple, without masses.  There is no thyromegaly or adenopathy.  HEART:  A regular rate and rhythm.  There is a 2/6 systolic ejection murmur  at the left sternal border (asymptomatic).  LUNGS:  Clear to auscultation.  There are no wheezes, rales, or rhonchi.  BACK:  No CVA tenderness.  ABDOMEN:  Bowel sounds are present.  It is soft without tenderness.  The  patient does have a firm mass arising from the pelvis to three finger  breadths below the umbilicus.  There is no organomegaly.  EXTREMITIES:  Without clubbing,  cyanosis, or edema.  PELVIC:  EG, BUS within normal limits.  The vagina is rugous.  The cervix is  nontender without lesions.  The uterus is 20-22 weeks size, nontender.  Adnexa are without any subtle masses or tenderness.  RECTAL/VAGINAL:  Examination confirms.   LABORATORY DATA:  Uterine pregnancy test is negative.   IMPRESSION:  1. Fibroid uterus.  2. Menorrhagia.  3. History of anemia.  4. Pelvic pain.   DISPOSITION:  A discussion was held with the patient regarding the  management options for her fibroids, which included Lupron and Depo, a  myomectomy, uterine artery embolization, and a hysterectomy.  The patient  has consented to undergo a total abdominal hysterectomy, and understands the  implications for this procedure, along with its risks which include, but are  not limited to, reaction to anesthesia, damage to adjacent organs, infection  and bleeding.  The patient also understands that should her ovaries appear  abnormal, they may also need to be removed.  The patient is scheduled for a  total abdominal hysterectomy at [facility] on October 21, 2002, at 7:30 a.m.       [name].                    Hal Morales, M.D.    EJP/MEDQ  D:  10/19/2002  T:  10/19/2002  Job:  [id number]

## 2011-01-05 NOTE — Op Note (Signed)
[name], [name]                        ACCOUNT NO.:  [id number]   MEDICAL RECORD NO.:  [id number]                   PATIENT TYPE:  INP   LOCATION:  9325                                 FACILITY:  [facility]   PHYSICIAN:  Hal Morales, M.D.             DATE OF BIRTH:  05/10/1961   DATE OF PROCEDURE:  10/21/2002  DATE OF DISCHARGE:                                 OPERATIVE REPORT   PREOPERATIVE DIAGNOSES:  1. Symptomatic uterine fibroids.  2. Menorrhagia.  3. Anemia.   POSTOPERATIVE DIAGNOSES:  1. Symptomatic uterine fibroids.  2. Menorrhagia.  3. Anemia.   OPERATION:  Total abdominal hysterectomy and bilateral salpingectomy.   SURGEON:  Hal Morales, M.D.   FIRST ASSISTANT:  [name].   ANESTHESIA:  General orotracheal.   ESTIMATED BLOOD LOSS:  700 mL.   COMPLICATIONS:  None.   URINE OUTPUT:  400 mL.   FINDINGS:  The uterus was enlarged to approximately 18 weeks size with  multiple myomata, the largest of which was posterior to the uterus and  measured approximately 12 cm.  The ovaries were normal bilaterally.  The  tubes were status post interruption for surgical sterilization.  The uterus  and fibroid weighed a total of 900 g.   PROCEDURE:  The patient was taken to the operating room after appropriate  identification and placed on the operating table.  After the attainment of  adequate general anesthesia with the patient in the supine position, the  abdomen, perineum, and vagina were prepped with multiple layers of Betadine.  A Foley catheter was inserted into the bladder and connected to straight  drainage.  The abdomen was draped as a sterile field.  20 mL of 2% Xylocaine  was used to infiltrate the midline at the site of the incision.  A midline  incision was made and the abdomen opened in layers.  The pelvis was  evaluated and the above noted findings made.  A self retaining retractor was  placed in the abdominal incision and the bowel  packed cephalad.  A Kelly  clamp was placed on the right cornual region and the round ligament  identified, suture ligated, and incised and that incision taken anteriorly  on the anterior leaf of the broad ligament.  The right utero-ovarian  ligament was then clamped, cut, and suture ligated with sutures of 0 and 2-0  Vicryl.  The remainder of the cornual region was then clamped, cut, and  suture ligated.  The left round ligament was then identified, suture  ligated, and incised and that incision taken anteriorly on the anterior leaf  of the broad ligament to meet the incision from the opposite side.  The  utero-ovarian ligament was isolated, clamped, cut, and suture ligated.  The  portion of tube which was adherent to the ovary was then isolated and  excised after clamping with a Kelly clamp.  The bladder was  dissected off  the anterior cervix; however, it was apparent from the very large posterior  fibroid that the uterine arteries could not be visualized in the incision.  It was thus planned to remove that large fibroid so that the uterine  arteries could be visualized.  The posterior serosal surface was then  infiltrated with a dilute solution of Pitressin and incised and with a  combination of sharp and blunt dissection the 12 cm posterior myoma was  removed from the myoma bed and removed from the operative field.  Several  hemostatic figure-of-eight sutures were placed in the uterus.  This allowed  better visualization of the uterine arteries which were then skeletonized,  clamped, cut, and suture ligated on the right and left sides.  Paracervical  tissues were then clamped, cut, and suture ligated down to the level of the  uterosacral ligaments.  The uterosacral ligaments were clamped, cut, and  suture ligated and those sutures held.  The vaginal angles were then  clamped, cut, and suture ligated with those sutures held.  The cervix and  uterus were then excised from the upper vagina  and a Richardson stitch  placed in the vaginal angle.  The remainder of the vaginal cuff was closed  with figure-of-eight sutures.  All sutures to this point unless otherwise  specified were 0 Vicryl.  Copious irrigation was carried out and a  hemostatic stitch placed in the right posterior vaginal cuff.  At that time  two Moskowitz sutures were placed in the posterior cul-de-sac incorporating  the uterosacral ligaments on either side and the intervening posterior  peritoneum.  The vaginal angles were tied to the sutures which had been held  from the uterosacral ligaments on either side.  With the notation that  hemostasis was adequate, all instruments were removed from the peritoneal  cavity.  Once all instruments had been removed from the peritoneal cavity  the abdominal incision was closed in a single layer with a modified Smead-  Jones suture of number one PDS from each apex to the midline and tied at the  midline.  This incorporated the peritoneum and fascia alternating with  fascial stitches.  Copious irrigation was carried out and the subcutaneous  tissue irrigated.  It was made hemostatic with Bovie cautery.  It was then  infiltrated with a total of 20 mL of Marcaine.  Skin staples were applied to  the skin incision.  Sterile dressing was applied.  The patient was awakened  from general anesthesia and taken to the recovery room in satisfactory  condition having tolerated the procedure well with sponge and instrument  counts correct.   SPECIMENS:  Uterus and cervix and uterine fibroid.                                               Hal Morales, M.D.    VPH/MEDQ  D:  10/21/2002  T:  10/21/2002  Job:  [id number]

## 2011-01-05 NOTE — Op Note (Signed)
NAMEYETUNDE, LEIS                        ACCOUNT NO.:  [id number]   MEDICAL RECORD NO.:  [id number]                   PATIENT TYPE:  INP   LOCATION:  2869                                 FACILITY:  [facility]   PHYSICIAN:  Clydene Fake, M.D.               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   OPERATION PERFORMED:  Right L4-5 far lateral diskectomy from a medial and  lateral approaches, microdissection with microscope.   SURGEON:  Clydene Fake, M.D.   ASSISTANT:  Cristi Loron, M.D.   ANESTHESIA:  General endotracheal tube.   ESTIMATED BLOOD LOSS:  Less than .   BLOOD REPLACED:  None.   DRAINS:  None.   COMPLICATIONS:  None.   INDICATIONS FOR PROCEDURE:  The patient is a 50 year old woman with back and  right leg pain, leg pain the worst symptom.  Some numbness and weakness  radiating mostly an L4 distribution. She has some weakness in the right  quadriceps, decreased deep tendon reflexes of the right knee, some sensation  decrease in the right L4 distribution. MRI was done showing spondylosis at  some multiple levels but mostly at 4-5 with the far lateral and foraminal  disk herniation on the right side at L4-5.  Patient was brought in for  decompression.   DESCRIPTION OF PROCEDURE:  The patient was brought into the operating room  and general anesthesia induced.  The patient was placed in a prone position  in a Wilson frame with all pressure points padded.  The patient was prepped  and draped in a sterile fashion.  The site of incision was injected with  20mL of 1% lidocaine with epinephrine.  The incision was then made over the  midline lower lumbar spine.  Incision  taken down to the fascia.  Hemostasis  obtained with Bovie cautery.  The fascia was incised over L3, 4, 5 spinous  process and subperiosteal dissection was done over the lamina.  X-rays  obtained.  The marker showed this was at the 3-4 level.  We then moved our  marker down to the lower space and showed this was the L4-5 level.  We  dissected out over the facet joint finding the transverse processes of 4 and  5 so we would have the extraforaminal approach to the 4-5 disk also.  Microscope was brought in for microdissection.  At this point a high speed  drill was used to perform a semihemilaminectomy.  Medial facetectomy was  completed with Kerrison punches.  Ligamentum flavum was removed from the  dura and we could just see the disk space.  There was a disk bulge  there.  Attention was taken down to the lateral side where a small amount of the  lateral facet was drilled and Kerrison punches were used on the pars to open  up the foramen a little bit by dissecting down the spinous process to the  pedicle down to the vertebral body and then starting to come up, found the  disk space, large disk bulge and actually some free fragmented disk was  found.  This was removed.  We reflected the nerve root out superiorly, found  the disk space, incised the disk space and started to do diskectomy with  pituitary rongeurs.  We continued with diskectomy and then sweeping out  under the roots, we found more and more free fragments as we removed these  and decompressed beneath 4 roots.  We were able to put a nerve hook more  medially but at the disk space there still seemed to be some pressure  looking at it from the medial side.  There was a large disk bulge and it was  increasing as it was going out the foramen.  The disk space was incised with  a 15 blade and diskectomy performed with pituitary rongeurs and curets. We  removed a significant amount of intraforaminal disk herniation and by  removing this,  continued to decrease pressure on this nerve root and open up  the foramen more.  We went both from the medial and lateral aspects.  When  we were finished we had great decompression of the foramen.  No pressure on  the 4 nerve root.  We dissected a little bit above the roots, pushed down  underneath and actually pushed a couple or more free fragments of disk that  were under the roots a little more laterally.  Again, when we were finished,  we had good decompression of roots into the foramen.  Antibiotic irrigation  was then used and suctioned out and four small pieces of Gelfoam were soaked  in Depo-Medrol and these were placed over the nerve roots both medially and  laterally.  Retractors were removed.  Fascia closed with 0 Vicryl  interrupted sutures.  Subcutaneous tissue closed with 0, 2-0 and 3-0 Vicryl  interrupted suture and skin closed with DermaBond skin glue.  When that was  dry, dressing was placed.  The patient was placed back to a supine position,  awakened from anesthesia and transferred to recovery room in stable  condition.                                                Clydene Fake, M.D.    JRH/MEDQ  D:  01/19/2003  T:  01/19/2003  Job:  [id number]

## 2011-01-05 NOTE — Discharge Summary (Signed)
[name], [name]                        ACCOUNT NO.:  [id number]   MEDICAL RECORD NO.:  [id number]                   PATIENT TYPE:  INP   LOCATION:  9325                                 FACILITY:  [facility]   PHYSICIAN:  Hal Morales, M.D.             DATE OF BIRTH:  Nov 25, 1960   DATE OF ADMISSION:  10/21/2002  DATE OF DISCHARGE:  10/23/2002                                 DISCHARGE SUMMARY   DISCHARGE DIAGNOSES:  1. Fibroid uterus (900 grams).  2. Menorrhagia.  3. Anemia.  4. Pelvic pain.   OPERATION:  On the date of admission patient underwent a total abdominal  hysterectomy with partial bilateral salpingectomy and abdominal myomectomy,  tolerating all procedures well.  The patient was found to have a fibroid  uterus with the largest fibroid measuring 480 grams.  The patient had normal  appearing tubes and ovaries.   HISTORY OF PRESENT ILLNESS:  Ms. [name] is a 50 year old single, African-  American female para 1, 2, 0, 3, with a history of uterine fibroids who  presents for hysterectomy because of pelvic pain and menorrhagia.  Please  see patient's dictated history and physical exam for details.   PHYSICAL EXAMINATION:  VITAL SIGNS: Blood pressure 134/60, weight is 284  pounds, height 5 feet, 7 inches tall.  GENERAL EXAM: Within normal limits, however do note on abdominal exam she  had present bowel sounds, her abdomen was soft without tenderness.  The  patient was noted to have a firm mass arising from the pelvis to three  fingerbreadths below the umbilicus, there was no  organomegaly.  PELVIC EXAM: EGBUS was within normal limits.  The vagina is rugose.  Cervix  is nontender without lesions.  The uterus was 20 to 22 weeks size and  nontender.  Adnexae without any palpable masses or tenderness.  Rectovaginal  exam confirmed.   HOSPITAL COURSE:  On the day of admission the patient underwent the  aforementioned procedures, tolerating them all well.  Her  postoperative  course was marked by a temperature of 100.4 on postoperative day number two,  however, this quickly defervesced.  The patient's postoperative hemoglobin  was 7.4 (preop hemoglobin 9.5).  The patient's postoperative course was  otherwise unremarkable with her resuming bowel and bladder function by  postoperative day number two and therefore deemed ready for discharge home.   DISCHARGE MEDICATIONS:  Tylox 1-2 tablets q.4-6h p.r.n. for pain, Ibuprofen  600 mg one tablet with food q.6h for five days then p.r.n. for pain, Colace  100 mg b.i.d. until bowel movements are regular, iron one tablet b.i.d. x6  weeks, Phenergan 25 mg one tablet q.6h p.r.n. for nausea.   FOLLOW UP:  The patient is scheduled for a staple removal at [facility] on 10/27/02 at 10 a.m.  She is scheduled for a six weeks  postoperative visit with Dr. Pennie Rushing on 12/07/02 at 2:30 p.m.  DISCHARGE INSTRUCTIONS:  The patient was given a copy of [facility] postoperative instruction sheet.  She was further advised to avoid  driving for two weeks, heavy lifting for four weeks, and intercourse for six  weeks.  The patient's diet was without restriction.   FINAL PATHOLOGY:  Uterus, tubes, ovaries and cervix.  Cervix with no  pathologic abnormalities identified.  Benign proliferative endometrium.  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     [name].                    Hal Morales, M.D.    EJP/MEDQ  D:  11/26/2002  T:  11/27/2002  Job:  [id number]

## 2011-01-05 NOTE — Procedures (Signed)
[name], [name]              ACCOUNT NO.:  [id number]   MEDICAL RECORD NO.:  [id number]          PATIENT TYPE:  OUT   LOCATION:  SLEEP CENTER                 FACILITY:  [facility]   PHYSICIAN:  Clinton D. Maple Hudson, MD, FCCP, FACPDATE OF BIRTH:  11/11/58   DATE OF STUDY:                            NOCTURNAL POLYSOMNOGRAM   INDICATION FOR STUDY:  Hypersomnia with sleep apnea.   EPWORTH SLEEPINESS SCORE:  2/24; BMI 44.6; weight 285 pounds.   MEDICATIONS:  Home medications listed and reviewed.   SLEEP ARCHITECTURE:  Total sleep time 334 minutes with deficiency 80%.  Stage I was 6%, stage II 81%; stages III and IV absent.  REM 13% of  total sleep time.  Sleep latency 8 minutes.  REM latency 233 minutes.  Awake after sleep onset 74 minutes.  Arousal index 20.1.  No bedtime  medication was taken.   RESPIRATORY DATA:  Split study protocol.  Apnea/hypopnea index (AHI,  RDI) 57.2 obstructive events per hour indicating severe obstructive  sleep apnea/hypopnea syndrome before CPAP.  There were two obstructive  apneas and 140 hypopnea's before CPAP.  Events were not positional.  REM  AHI 0.  CPAP was titrated to 15 CWP, AHI 0.8 per hour.  A large  Respironics Comfort Full mask was used with heated humidifier.   OXYGEN DATA:  Moderate snoring with oxygen desaturation to a nadir of  87% before CPAP.  After CPAP control, saturation held 95% on room air.   CARDIAC DATA:  Normal sinus rhythm.   MOVEMENT-PARASOMNIA:  No sleep disturbance by movement disorder.   IMPRESSIONS-RECOMMENDATIONS:  1. Severe obstructive sleep apnea/hypopnea syndrome, AHI 57.2 per hour      with nonpositional events, mostly hypopnea's, moderate snoring with      oxygen desaturation to a nadir of 87%.  2. Successful CPAP titration to 15 CWP, AHI 0.8 per hour.  A medium      Comfort Full mask was used with heated humidifier.      Clinton D. Maple Hudson, MD, [facility], FACP  Diplomate, Biomedical engineer of Sleep Medicine  Electronically Signed    CDY/MEDQ  D:  11/10/2006 18:10:41  T:  11/11/2006 13:24:40  Job:  [id number]

## 2011-01-08 ENCOUNTER — Ambulatory Visit (INDEPENDENT_AMBULATORY_CARE_PROVIDER_SITE_OTHER): Payer: BC Managed Care – PPO | Admitting: *Deleted

## 2011-01-08 DIAGNOSIS — IMO0001 Reserved for inherently not codable concepts without codable children: Secondary | ICD-10-CM

## 2011-01-08 DIAGNOSIS — Z111 Encounter for screening for respiratory tuberculosis: Secondary | ICD-10-CM

## 2011-01-08 LAB — TB SKIN TEST: TB Skin Test: NEGATIVE mm

## 2011-01-08 NOTE — Progress Notes (Signed)
PPD negative-0 mm. 

## 2011-01-18 NOTE — Telephone Encounter (Signed)
This encounter was created in error - please disregard.

## 2011-01-30 ENCOUNTER — Encounter: Payer: Self-pay | Admitting: Family Medicine

## 2011-01-30 ENCOUNTER — Ambulatory Visit (INDEPENDENT_AMBULATORY_CARE_PROVIDER_SITE_OTHER): Payer: BC Managed Care – PPO | Admitting: Family Medicine

## 2011-01-30 DIAGNOSIS — IMO0002 Reserved for concepts with insufficient information to code with codable children: Secondary | ICD-10-CM

## 2011-01-30 DIAGNOSIS — K5900 Constipation, unspecified: Secondary | ICD-10-CM

## 2011-01-30 DIAGNOSIS — M5414 Radiculopathy, thoracic region: Secondary | ICD-10-CM

## 2011-01-30 DIAGNOSIS — G4733 Obstructive sleep apnea (adult) (pediatric): Secondary | ICD-10-CM

## 2011-01-30 DIAGNOSIS — I10 Essential (primary) hypertension: Secondary | ICD-10-CM

## 2011-01-30 NOTE — Assessment & Plan Note (Signed)
Patient stopped taking HCTZ since she thought her blood pressure was okay. Encouraged her to start taking again and then to make an appointment to see me soon regarding this.

## 2011-01-30 NOTE — Progress Notes (Signed)
  Subjective:    [name], female    DOB: 21-May-1961, 50 y.o.   MRN: [id number]  HPI    Review of Systems     Objective:   Physical Exam        Assessment & Plan:  Filling out paperwork for her insurance today.

## 2011-01-30 NOTE — Patient Instructions (Signed)
Please let me know if there are any problems with the forms filled.  Make an appointment to see me in the next few weeks (after you've taken your blood pressure medications), and let's check up on your blood pressure.   It was nice to see you.

## 2011-02-14 ENCOUNTER — Telehealth: Payer: Self-pay | Admitting: Family Medicine

## 2011-02-14 NOTE — Telephone Encounter (Signed)
Outbreak under arm/chest and it's a fungus that she gets every year during hot days - ususally clears up with diflucan and nystatin cream -  [facility] [address]  Would like to pick this up tonight.

## 2011-02-14 NOTE — Telephone Encounter (Signed)
Will forward to Dr. Madolyn Frieze as she saw her on 6.12.12 [name], Maryjo [place]

## 2011-02-19 ENCOUNTER — Ambulatory Visit (INDEPENDENT_AMBULATORY_CARE_PROVIDER_SITE_OTHER): Payer: BC Managed Care – PPO | Admitting: Family Medicine

## 2011-02-19 DIAGNOSIS — R21 Rash and other nonspecific skin eruption: Secondary | ICD-10-CM | POA: Insufficient documentation

## 2011-02-19 DIAGNOSIS — L509 Urticaria, unspecified: Secondary | ICD-10-CM

## 2011-02-19 MED ORDER — NYSTATIN 100000 UNIT/GM EX OINT: TOPICAL_OINTMENT | Freq: Two times a day (BID) | CUTANEOUS | Status: DC

## 2011-02-19 MED ORDER — FLUCONAZOLE 150 MG PO TABS: ORAL_TABLET | ORAL | Status: DC

## 2011-02-19 NOTE — Patient Instructions (Signed)
Try the diflucan and the nystatin.  I would also try benedryl at night and claritin or zyrtec in the AM to help with the allergic component.

## 2011-02-19 NOTE — Assessment & Plan Note (Signed)
Urticarial rash on chest and arms without fever, no new meds so soaps. No sun exposure.  Pt states gets better with diflucan and nystatin so will try that.  ?allergic to a type of yeast since it is worse in summer, starts under her arms?  Also try benedryl for itch.

## 2011-02-19 NOTE — Progress Notes (Signed)
  Subjective:    [name], female    DOB: 1960-10-18, 50 y.o.   MRN: [id number]  HPI 3 weeks of chapped lips and itchy eye lids.  One week of itchy red rash on chest and arms.  Not much better with cortisone, better with nystatin.  She ran out of nystatin.   No new medications, no new soaps, no sun exposure, no tick exposure. Pt states she gets a similar rash 1-2x/summer that is helped by nystatin and diflucan.     Review of Systems    denies fever, HA  Objective:   Physical Exam Vital signs reviewed General appearance - alert, well appearing, and in no distress and oriented to person, place, and time Skin - normal coloration and turgor, however there are urticarial lesions on chest and flexor surface of arms.  Nothing on hands.  Dermographism present.        Assessment & Plan:

## 2011-04-25 ENCOUNTER — Other Ambulatory Visit: Payer: Self-pay | Admitting: Family Medicine

## 2011-04-25 NOTE — Telephone Encounter (Signed)
Refill request

## 2011-06-18 ENCOUNTER — Encounter: Payer: Self-pay | Admitting: Family Medicine

## 2011-06-18 ENCOUNTER — Ambulatory Visit (INDEPENDENT_AMBULATORY_CARE_PROVIDER_SITE_OTHER): Payer: BC Managed Care – PPO | Admitting: Family Medicine

## 2011-06-18 VITALS — BP 153/78 | HR 91 | Temp 98.2°F | Ht 66.5 in | Wt 301.0 lb

## 2011-06-18 DIAGNOSIS — L538 Other specified erythematous conditions: Secondary | ICD-10-CM

## 2011-06-18 DIAGNOSIS — R1904 Left lower quadrant abdominal swelling, mass and lump: Secondary | ICD-10-CM

## 2011-06-18 DIAGNOSIS — R7309 Other abnormal glucose: Secondary | ICD-10-CM

## 2011-06-18 DIAGNOSIS — R739 Hyperglycemia, unspecified: Secondary | ICD-10-CM

## 2011-06-18 DIAGNOSIS — Z23 Encounter for immunization: Secondary | ICD-10-CM

## 2011-06-18 LAB — POCT GLYCOSYLATED HEMOGLOBIN (HGB A1C): Hemoglobin A1C: 6.4

## 2011-06-18 MED ORDER — HYDROXYZINE HCL 25 MG PO TABS: 25.0000 mg | ORAL_TABLET | Freq: Four times a day (QID) | ORAL | Status: AC | PRN

## 2011-06-18 MED ORDER — FLUCONAZOLE 150 MG PO TABS: 150.0000 mg | ORAL_TABLET | ORAL | Status: AC

## 2011-06-18 NOTE — Patient Instructions (Addendum)
I think your stomach fullness and your rash is related to your weight. Let's work together on this. I will refer you to Wyona Almas who will call you to set up an appointment.   Take the diflucan and continue to use the Nystatin cream to see if it helps your rash.  Keep those areas dry.  Take hydroxyzine as needed for itchiness. This should make you less sleepy than Benadryl.  I called in the prescriptions for the diflucan and hydroxyzine.   Follow-up with me in 1 week.

## 2011-06-18 NOTE — Assessment & Plan Note (Signed)
Recurrent in abdominal and groin folds. Happens about 3 times a year.  Will look at skin scrapings to confirm yeast. But since this is consistent with previous rashes treated successfully with diflucan and since it is consistent with yeast, will treat with Diflucan x 3 doses. Will follow-up with me in 1 week to re-assess. Hgb A1c is 6.4. Patient is borderline diabetic due to her obesity.  Referring to nutritionist. Patient wants to lose weight and is concerned about her increase in weight.

## 2011-06-18 NOTE — Progress Notes (Signed)
  Subjective:    [name], female    DOB: 02-24-1961, 50 y.o.   MRN: [id number]  HPI 1. Skin rash In the folds of her belly and in her groin area. Started several days ago. Very itchy. Not painful. Using nystatin cream which is not really helping.  This happens to her about 3 times every year. Oral diflucan for 3 doses usually helps. ROS: denies using any new soaps, creams or wearing new clothing, belts; denies exposure to environmental allergens.  2. 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 She is also s/p hysterectomy.  ROS: denies nausea/vomiting/constipation (bowel movements daily), abdominal pain, dysuria, vaginal irritation/discharge  Review of Systems Per HPI with inclusion of following: denies headaches, chest pain. 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 Reports not eating much during day but does not eat well when she does and drinks a lot of [facility]. Concerned about her gain in weight.     Objective:   Physical Exam Gen: NAD, morbidly obese Psych: mildly anxious-appearing CV: RRR, no murmurs Pulm: no increased WOB Abd: NABS, obese with excess abdominal tissue, soft, NT, no mass palpable throughout Skin: hyperpigmented area at abdominal skin folds that is dry and flaky, not moist.  Area at groin folds hyperpigmented and moist. Both areas do not have any particular smell.     Assessment & Plan:

## 2011-06-18 NOTE — Assessment & Plan Note (Signed)
I could not palpate a mass that the patient reports feeling sometimes. This is likely due to increasing abdominal girth due to increase in weight. May be a hernia. But patient has no symptoms of incarceration. Patient given red flags.

## 2011-07-27 ENCOUNTER — Encounter: Payer: Self-pay | Admitting: Family Medicine

## 2011-07-27 ENCOUNTER — Ambulatory Visit (INDEPENDENT_AMBULATORY_CARE_PROVIDER_SITE_OTHER): Payer: BC Managed Care – PPO | Admitting: Family Medicine

## 2011-07-27 VITALS — BP 131/79 | HR 82 | Temp 98.2°F | Ht 66.5 in | Wt 297.0 lb

## 2011-07-27 DIAGNOSIS — R21 Rash and other nonspecific skin eruption: Secondary | ICD-10-CM

## 2011-07-27 MED ORDER — CETIRIZINE HCL 10 MG PO CHEW: 10.0000 mg | CHEWABLE_TABLET | Freq: Every day | ORAL | Status: DC | PRN

## 2011-07-27 MED ORDER — TRIAMCINOLONE ACETONIDE 0.05 % EX OINT: 1.0000 "application " | TOPICAL_OINTMENT | Freq: Two times a day (BID) | CUTANEOUS | Status: DC | PRN

## 2011-07-27 NOTE — Patient Instructions (Signed)
We think you may have eczema. It is important you moisturize your skin well. Use the Eucerin frequently throughout the day (at least 1-2 times daily) and especially after you wash.  Also try the steroid cream twice a day as needed. Also try the Zyrtec oral medication daily for itch.   Avoid strong soaps and lotions. Try using Dove or [place] soap and mild detergent for your clothing.  Follow-up in 1-2 weeks.   Eczema Atopic dermatitis, or eczema, is an inherited type of sensitive skin. Often people with eczema have a family history of allergies, asthma, or hay fever. It causes a red itchy rash and dry scaly skin. The itchiness may occur before the skin rash and may be very intense. It is not contagious. Eczema is generally worse during the cooler winter months and often improves with the warmth of summer. Eczema usually starts showing signs in infancy. Some children outgrow eczema, but it may last through adulthood. Flare-ups may be caused by:  Eating something or contact with something you are sensitive or allergic to.     Stress.  DIAGNOSIS  The diagnosis of eczema is usually based upon symptoms and medical history. TREATMENT   Eczema cannot be cured, but symptoms usually can be controlled with treatment or avoidance of allergens (things to which you are sensitive or allergic to).  Controlling the itching and scratching.     Use over-the-counter antihistamines as directed for itching. It is especially useful at night when the itching tends to be worse.     Use over-the-counter steroid creams as directed for itching.     Scratching makes the rash and itching worse and may cause impetigo (a skin infection) if fingernails are contaminated (dirty).     Keeping the skin well moisturized with creams every day. This will seal in moisture and help prevent dryness. Lotions containing alcohol and water can dry the skin and are not recommended.     Limiting exposure to allergens.     Recognizing  situations that cause stress.     Developing a plan to manage stress.  HOME CARE INSTRUCTIONS    Take prescription and over-the-counter medicines as directed by your caregiver.     Do not use anything on the skin without checking with your caregiver.     Keep baths or showers short (5 minutes) in warm (not hot) water. Use mild cleansers for bathing. You may add non-perfumed bath oil to the bath water. It is best to avoid soap and bubble bath.     Immediately after a bath or shower, when the skin is still damp, apply a moisturizing ointment to the entire body. This ointment should be a petroleum ointment. This will seal in moisture and help prevent dryness. The thicker the ointment the better. These should be unscented.     Keep fingernails cut short and wash hands often. If your child has eczema, it may be necessary to put soft gloves or mittens on your child at night.     Dress in clothes made of cotton or cotton blends. Dress lightly, as heat increases itching.     Avoid foods that may cause flare-ups. Common foods include cow's milk, peanut butter, eggs and wheat.     Keep a child with eczema away from anyone with fever blisters. The virus that causes fever blisters (herpes simplex) can cause a serious skin infection in children with eczema.  SEEK MEDICAL CARE IF:    Itching interferes with sleep.  The rash gets worse or is not better within one week following treatment.     The rash looks infected (pus or soft yellow scabs).     You or your child has an oral temperature above 102 F (38.9 C).     Your baby is older than 3 months with a rectal temperature of 100.5 F (38.1 C) or higher for more than 1 day.     The rash flares up after contact with someone who has fever blisters.  SEEK IMMEDIATE MEDICAL CARE IF:    Your baby is older than 3 months with a rectal temperature of 102 F (38.9 C) or higher.     Your baby is older than 3 months or younger with a rectal  temperature of 100.4 F (38 C) or higher.  Document Released: 08/03/2000 Document Revised: 04/18/2011 Document Reviewed: 06/08/2009 [facility] Patient Information 2012 [place], [place].

## 2011-07-27 NOTE — Assessment & Plan Note (Signed)
KOH prep done at last visit and was negative. Current presentation appears more consistent with eczema. Precepted with Dr. Leveda Anna. Especially since nystatin cream and oral diflucan did not help.  Will try steroid cream, Zyrtec (Atarax caused drowsiness and did not help), and keeping skin moisturized with heavy emollient such as Eucerin. Also recommended mild soaps and detergents. Follow-up in 1-2 weeks.

## 2011-07-27 NOTE — Progress Notes (Signed)
  Subjective:    [name], female    DOB: 1961/01/26, 50 y.o.   MRN: [id number]  HPI Rash  Worse than when she came in before Now over her buttocks, under her arms, across abdomen Very itchy. Atarax does not help itch, just makes her tired. Diflucan did not help. Took all 3 tablets.   Had similar problem previously but when she was a child.   Review of Systems Denies using new soaps, detergents, new clothing.  No one in her family has similar symptoms. Itching is not worse at night.     Objective:   Physical Exam Gen: appears uncomfortable, scratching frequently Psych: engaged, appropriate, pleasant Skin: dry skin throughout; hyperpigmented areas across abdomen and under arms; excoriations across trunk, buttocks, and on ventral surface of upper arms; rash is hyperpigmented, dry, flaky   Webs of fingers and toes not involved    Assessment & Plan:

## 2011-08-01 ENCOUNTER — Telehealth: Payer: Self-pay | Admitting: Family Medicine

## 2011-08-01 NOTE — Telephone Encounter (Signed)
Left message on voicemail that info requested was ready to picked up.

## 2011-08-01 NOTE — Telephone Encounter (Signed)
Would like to pick up copy of last lab work, this is needed asap for her job, please call when ready

## 2011-08-10 ENCOUNTER — Ambulatory Visit (INDEPENDENT_AMBULATORY_CARE_PROVIDER_SITE_OTHER): Payer: BC Managed Care – PPO | Admitting: Family Medicine

## 2011-08-10 ENCOUNTER — Encounter: Payer: Self-pay | Admitting: Family Medicine

## 2011-08-10 DIAGNOSIS — M064 Inflammatory polyarthropathy: Secondary | ICD-10-CM

## 2011-08-10 DIAGNOSIS — M1990 Unspecified osteoarthritis, unspecified site: Secondary | ICD-10-CM

## 2011-08-10 DIAGNOSIS — R21 Rash and other nonspecific skin eruption: Secondary | ICD-10-CM

## 2011-08-10 MED ORDER — PERMETHRIN 5 % EX CREA: 1.0000 "application " | TOPICAL_CREAM | Freq: Once | CUTANEOUS | Status: DC

## 2011-08-10 NOTE — Assessment & Plan Note (Signed)
Scabies. Other people in family affected and permethrin cream helping. Repeat permethrin cream. Follow-up as needed.

## 2011-08-10 NOTE — Assessment & Plan Note (Signed)
Worsening bilateral knee pain. Last received steroid injection August 2012 which helped. Since $40 co-pay cost-prohibitive for patient at [facility], will request records from there to see what was done and if appropriate, will perform injections here. Patient signed ROI today.

## 2011-08-10 NOTE — Patient Instructions (Signed)
Permethrin cream has been sent over to the pharmacy. I am glad that the rash is getting better.  We will request records regarding the joint injection. If we can do it here, we will try to do it at your follow-up visit.  Please make an appointment in 2-4 weeks for skin tag removal and joint injections if appropriate.

## 2011-08-10 NOTE — Progress Notes (Signed)
  Subjective:    [name], female    DOB: Nov 20, 1960, 50 y.o.   MRN: [id number]  HPI 1. Follow-up of rash Patient's children family members diagnosed with scabies, and patient diagnosed with scabies at the same time.  Permethrin cream x 1 has helped. Continues to have a rash but it is much better.  2. Inflammatory arthritis bilateral knees Worse recently Last steroid injection at [facility] in August Cannot afford $40 co-pay to go back there Demerol helps but is not helping as much  Review of Systems Per HPI    Objective:   Physical Exam Gen: NAD, morbidly obese Psych: pleasant, engaged, appropriate Skin: hyperpigmented, scaly pinpoint and slash lesions on arms, abdomen, buttocks; this is improved from previously Knees: pain with ROM; no obvious swelling, erythema, or warmth    Assessment & Plan:

## 2011-08-16 ENCOUNTER — Ambulatory Visit: Payer: BC Managed Care – PPO | Admitting: Family Medicine

## 2011-08-29 ENCOUNTER — Ambulatory Visit (INDEPENDENT_AMBULATORY_CARE_PROVIDER_SITE_OTHER): Payer: BC Managed Care – PPO | Admitting: Family Medicine

## 2011-08-29 ENCOUNTER — Encounter: Payer: Self-pay | Admitting: Family Medicine

## 2011-08-29 NOTE — Patient Instructions (Signed)
Keep the areas were removed the skin tags clean and dry. Use an antibiotic ointment. You may want to keep the areas covered with a bandage through tomorrow.  Keep your next appointment. Follow-up for your skin tags as needed if they become inflamed or you start having fevers.

## 2011-08-29 NOTE — Progress Notes (Signed)
  Subjective:    [name], female    DOB: 08/03/1961, 51 y.o.   MRN: [id number]  HPI Here for removal of skin tags.  Under both breasts and causing skin irritation against bra.   Review of Systems    Objective:   Physical Exam  Pulmonary/Chest:     Skin: several skin tags in intertriginous area beneath breasts bilaterally    Assessment & Plan:  Removed skin tags.  Sterilized area with alcohol swabs. Used a total of 2 mL of lidocaine with epinephrine to numb area beneath skin tags. Snipped with scissors. Cauterized with silver nitrate sticks and covered with bandages.

## 2011-08-31 ENCOUNTER — Other Ambulatory Visit: Payer: BC Managed Care – PPO

## 2011-08-31 LAB — LIPID PANEL
Cholesterol: 214 mg/dL — ABNORMAL HIGH (ref 0–200)
Total CHOL/HDL Ratio: 5.5 Ratio
Triglycerides: 194 mg/dL — ABNORMAL HIGH (ref <150)
VLDL: 39 mg/dL (ref 0–40)

## 2011-08-31 NOTE — Progress Notes (Signed)
FLP DONE TODAY Cledith Abdou 

## 2011-09-04 ENCOUNTER — Telehealth: Payer: Self-pay | Admitting: Family Medicine

## 2011-09-04 NOTE — Telephone Encounter (Signed)
Pt is requesting to pick up copy of latest lab work

## 2011-09-04 NOTE — Telephone Encounter (Signed)
Spoke with patient, informed her that labs would be placed up front for pick up.

## 2011-10-03 ENCOUNTER — Other Ambulatory Visit: Payer: Self-pay | Admitting: Family Medicine

## 2011-10-03 NOTE — Telephone Encounter (Signed)
Needs appointment for blood pressure follow-up and for more refills.

## 2011-10-03 NOTE — Telephone Encounter (Signed)
Refill request

## 2011-10-19 ENCOUNTER — Ambulatory Visit (INDEPENDENT_AMBULATORY_CARE_PROVIDER_SITE_OTHER): Payer: BC Managed Care – PPO | Admitting: *Deleted

## 2011-10-19 DIAGNOSIS — Z111 Encounter for screening for respiratory tuberculosis: Secondary | ICD-10-CM

## 2011-10-19 NOTE — Progress Notes (Signed)
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

## 2011-10-22 ENCOUNTER — Ambulatory Visit (INDEPENDENT_AMBULATORY_CARE_PROVIDER_SITE_OTHER): Payer: BC Managed Care – PPO | Admitting: *Deleted

## 2011-10-22 DIAGNOSIS — Z111 Encounter for screening for respiratory tuberculosis: Secondary | ICD-10-CM

## 2011-10-22 DIAGNOSIS — IMO0001 Reserved for inherently not codable concepts without codable children: Secondary | ICD-10-CM

## 2011-11-02 ENCOUNTER — Other Ambulatory Visit (HOSPITAL_COMMUNITY)
Admission: RE | Admit: 2011-11-02 | Discharge: 2011-11-02 | Disposition: A | Discharge status: Home / Self Care | Payer: BC Managed Care – PPO | Source: Ambulatory Visit | Attending: Family Medicine | Admitting: Family Medicine

## 2011-11-02 ENCOUNTER — Ambulatory Visit (INDEPENDENT_AMBULATORY_CARE_PROVIDER_SITE_OTHER): Payer: BC Managed Care – PPO | Admitting: Family Medicine

## 2011-11-02 ENCOUNTER — Encounter: Payer: Self-pay | Admitting: Family Medicine

## 2011-11-02 VITALS — BP 141/73 | HR 84 | Temp 98.2°F | Ht 66.5 in | Wt 298.0 lb

## 2011-11-02 DIAGNOSIS — R1904 Left lower quadrant abdominal swelling, mass and lump: Secondary | ICD-10-CM

## 2011-11-02 DIAGNOSIS — Z124 Encounter for screening for malignant neoplasm of cervix: Secondary | ICD-10-CM

## 2011-11-02 DIAGNOSIS — I10 Essential (primary) hypertension: Secondary | ICD-10-CM

## 2011-11-02 DIAGNOSIS — Z01419 Encounter for gynecological examination (general) (routine) without abnormal findings: Secondary | ICD-10-CM | POA: Insufficient documentation

## 2011-11-02 LAB — BASIC METABOLIC PANEL WITH GFR
BUN: 12 mg/dL (ref 6–23)
CO2: 25 meq/L (ref 19–32)
Calcium: 9.4 mg/dL (ref 8.4–10.5)
Chloride: 107 meq/L (ref 96–112)
Creat: 0.85 mg/dL (ref 0.50–1.10)
Glucose, Bld: 98 mg/dL (ref 70–99)
Potassium: 3.9 meq/L (ref 3.5–5.3)
Sodium: 142 meq/L (ref 135–145)

## 2011-11-02 MED ORDER — EPINEPHRINE 0.3 MG/0.3ML IJ DEVI: 0.3000 mg | Freq: Once | INTRAMUSCULAR | Status: DC

## 2011-11-02 NOTE — Assessment & Plan Note (Signed)
Slightly elevated today (SBP 140s). Not taking HCTZ due to concerns may be causing muscle cramping. Checking BMET today. If normal, will discuss with patient about keeping HCTZ or changing to different medication (consider ACEi).

## 2011-11-02 NOTE — Assessment & Plan Note (Signed)
Patient concerned hernia is enlarging and that she has another mass. No signs/symptoms incarcerated hernia except for mild-moderate TTP in this area. Will check CT-abdomen/pelvis to evaluate. 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

## 2011-11-02 NOTE — Progress Notes (Signed)
  Subjective:    [name], female    DOB: Jan 25, 1961, 51 y.o.   MRN: [id number]  HPI 1. Physical Would like Pap smear today. Last Nov/Dec 2011.  No history of abnormal Pap smears per patient.   2. LLQ abdominal mass Persistent, concerned, especially with history of ovarian mass. Denies nausea/vomiting, constipation, abdominal pain.  Patient has ventral hernia  3. HTN Thinks HCTZ may be causing muscle cramps and has not been taking it ROS: denies chest pain, difficulty breathing.  Review of Systems Per HPI    Objective:   Physical Exam Gen: NAD, obese CV: RRR, 2/6 systolic murmur Pulm: CTAB without w/r/r Abd: obese, NABS, soft, non-tender, reducible hernia left lower abdomen and smaller, harder palpable mass below hernia with mild-moderate TTP Ext; no edema Skin: several moles throughout body, especially on bilateral feet; scars from previous scabies infection, dry and healing well    Assessment & Plan:

## 2011-11-02 NOTE — Patient Instructions (Addendum)
I will call you regarding your lab results.  If your pap results are normal, I will send you a letter with the results. If abnormal, someone at the clinic will get in touch with you.   We will get a CT of your abdomen.  Schedule a follow-up in 1 month to talk about your blood pressure.

## 2011-11-03 ENCOUNTER — Encounter: Payer: Self-pay | Admitting: Family Medicine

## 2011-11-05 ENCOUNTER — Other Ambulatory Visit (HOSPITAL_COMMUNITY): Payer: BC Managed Care – PPO

## 2011-11-07 ENCOUNTER — Encounter: Payer: BC Managed Care – PPO | Admitting: Family Medicine

## 2011-11-07 ENCOUNTER — Other Ambulatory Visit (HOSPITAL_COMMUNITY): Payer: BC Managed Care – PPO

## 2011-11-09 ENCOUNTER — Other Ambulatory Visit (HOSPITAL_COMMUNITY): Payer: BC Managed Care – PPO

## 2011-11-12 ENCOUNTER — Ambulatory Visit (HOSPITAL_COMMUNITY)
Admission: RE | Admit: 2011-11-12 | Discharge: 2011-11-12 | Disposition: A | Discharge status: Home / Self Care | Payer: BC Managed Care – PPO | Source: Ambulatory Visit | Attending: Family Medicine | Admitting: Family Medicine

## 2011-11-12 DIAGNOSIS — K429 Umbilical hernia without obstruction or gangrene: Secondary | ICD-10-CM | POA: Insufficient documentation

## 2011-11-12 DIAGNOSIS — K7689 Other specified diseases of liver: Secondary | ICD-10-CM | POA: Insufficient documentation

## 2011-11-12 DIAGNOSIS — R1904 Left lower quadrant abdominal swelling, mass and lump: Secondary | ICD-10-CM

## 2011-11-12 DIAGNOSIS — R110 Nausea: Secondary | ICD-10-CM | POA: Insufficient documentation

## 2011-11-12 MED ORDER — IOHEXOL 300 MG/ML  SOLN
100.0000 mL | Freq: Once | INTRAMUSCULAR | Status: AC | PRN
  Administered 2011-11-12: 100 mL via INTRAVENOUS

## 2011-11-13 ENCOUNTER — Telehealth: Payer: Self-pay | Admitting: Family Medicine

## 2011-11-13 NOTE — Telephone Encounter (Signed)
Discussed CT-abdomen results with patient. Stable left ventral hernia. No lymphadenopathy, no adnexal masses.

## 2011-11-13 NOTE — Telephone Encounter (Signed)
Message copied by [facility], Etta Quill on Tue Nov 13, 2011  5:31 PM ------      Message from: [place], SARAH T      Created: Tue Nov 13, 2011  2:39 PM                   ----- Message -----         From: Rad Results In Interface         Sent: 11/12/2011   9:41 AM           To: Sarah T [place], MD

## 2011-12-20 ENCOUNTER — Telehealth: Payer: Self-pay | Admitting: Family Medicine

## 2011-12-20 NOTE — Telephone Encounter (Signed)
Has scabies again from her kids and wants to know if she can have some meds called in for her.  [facility] [address]

## 2011-12-20 NOTE — Telephone Encounter (Signed)
Patient states she had scabies about 2 months ago. Consulted with Dr. Leveda Anna and he advises that patient can get Ridd or Nix OTC and use per directions on bottle. Patient is agreeable to this and it not improving will call for appointment.

## 2011-12-24 ENCOUNTER — Telehealth: Payer: Self-pay | Admitting: Family Medicine

## 2011-12-24 MED ORDER — PERMETHRIN 5 % EX CREA: TOPICAL_CREAM | CUTANEOUS | Status: DC

## 2011-12-24 NOTE — Telephone Encounter (Signed)
Please notify patient Rx for Permethrin has been sent.

## 2011-12-24 NOTE — Telephone Encounter (Signed)
Used otc for lice that was suggested by provider and it did not work.  Need to have the Premaritin cream that was originally requested called to pharmcy[facility] [address]

## 2012-01-04 ENCOUNTER — Encounter: Payer: Self-pay | Admitting: Family Medicine

## 2012-01-04 ENCOUNTER — Ambulatory Visit (INDEPENDENT_AMBULATORY_CARE_PROVIDER_SITE_OTHER): Payer: BC Managed Care – PPO | Admitting: Family Medicine

## 2012-01-04 VITALS — BP 132/83 | HR 80 | Temp 98.7°F | Ht 66.5 in | Wt 300.2 lb

## 2012-01-04 DIAGNOSIS — J329 Chronic sinusitis, unspecified: Secondary | ICD-10-CM | POA: Insufficient documentation

## 2012-01-04 DIAGNOSIS — J309 Allergic rhinitis, unspecified: Secondary | ICD-10-CM

## 2012-01-04 DIAGNOSIS — J019 Acute sinusitis, unspecified: Secondary | ICD-10-CM

## 2012-01-04 DIAGNOSIS — J302 Other seasonal allergic rhinitis: Secondary | ICD-10-CM | POA: Insufficient documentation

## 2012-01-04 DIAGNOSIS — B379 Candidiasis, unspecified: Secondary | ICD-10-CM

## 2012-01-04 HISTORY — DX: Chronic sinusitis, unspecified: J32.9

## 2012-01-04 MED ORDER — LORATADINE-PSEUDOEPHEDRINE ER 10-240 MG PO TB24: 1.0000 | ORAL_TABLET | Freq: Every day | ORAL | Status: AC

## 2012-01-04 MED ORDER — FLUCONAZOLE 150 MG PO TABS: 150.0000 mg | ORAL_TABLET | Freq: Every day | ORAL | Status: AC

## 2012-01-04 MED ORDER — AZITHROMYCIN 250 MG PO TABS: ORAL_TABLET | ORAL | Status: AC

## 2012-01-04 MED ORDER — GUAIFENESIN-CODEINE 100-10 MG/5ML PO SYRP: 5.0000 mL | ORAL_SOLUTION | Freq: Three times a day (TID) | ORAL | Status: AC | PRN

## 2012-01-04 NOTE — Assessment & Plan Note (Signed)
Patient routinely gets yeast infections and candidal infections in her intertriginous zones when taking antibiotics. Will give 3 tabs of diflucan prn infection.

## 2012-01-04 NOTE — Assessment & Plan Note (Signed)
Patient has been on flonase and zyrtec for a while. She has a very raw nasal mucosa right now. She will hold flonase. I have prescribed claritin-d 24 hrs - this will clear her up. She will start taking zyrtec again after the Claritin-d course finishes.

## 2012-01-04 NOTE — Patient Instructions (Signed)
Meds ordered this encounter  Medications  . loratadine-pseudoephedrine (CLARITIN-D 24 HOUR) 10-240 MG per 24 hr tablet    Sig: Take 1 tablet by mouth daily.    Dispense:  10 tablet    Refill:  0  . azithromycin (ZITHROMAX) 250 MG tablet    Sig: Take two tabs the first day and one tab for the next four days    Dispense:  6 tablet    Refill:  0  . fluconazole (DIFLUCAN) 150 MG tablet    Sig: Take 1 tablet (150 mg total) by mouth daily.    Dispense:  3 tablet    Refill:  0  . guaiFENesin-codeine (ROBITUSSIN AC) 100-10 MG/5ML syrup    Sig: Take 5 mLs by mouth 3 (three) times daily as needed for cough.    Dispense:  120 mL    Refill:  0    Cheaper brand acceptable.   You have a sinus infection that has spread to your left ear. You also have concomitant allergies that are worsening this infection. I have prescribed you diflucan as requested because you get yeast infections when taking antibiotics, don't take this unless you develop symptoms.   Please schedule an appointment with your PCP to discuss other health issues.  If you are not better in a week, please come back and see [place].

## 2012-01-04 NOTE — Assessment & Plan Note (Signed)
Normally wouldn't use antibiotics with sinus infection, but this has spread to her left ear, she has fevers, and it's been 6 weeks and worsening. Will use a z-pack for five days.

## 2012-01-04 NOTE — Progress Notes (Signed)
  Subjective:   [name], female DOB: 05/06/1961 51 y.o. MRN: [id number] HPI:  1. Sinus congestions and left ear pain Synopsis: patient has 6-8 weeks of left ear pain associated with sinus congestion, very raw nasal passages and cough.  She has had a fever up to 100 degrees F Location: Nasal mucose, left ear, throat, sinuses Onset: has been acute and worsening  Time period of: 6-8 week(s).  Severity is described as moderate to severe.  Aggravating: flonase irritating nose. Seasonal allergies.  Alleviating: none Associated sx/sn: no joint involvement. No sob. She does have a productive cough. Does have fever.   2. Seasonal allergies rhinitis Synopsis: patient suffers from allergies related to seasonal change. She takes flonase and zyrtec, which usually works.  Location: itchy eyes and running nose Onset: has been chronic and recurrent  Time period of: many year(s).  Severity is described as mild-moderate.  Aggravating: seasonal change, Upper respiratory infection Alleviating: zyrtec and flonase Associated sx/sn: running nose, itchy red eyes.   History  Substance Use Topics  . Smoking status: Never Smoker   . Smokeless tobacco: Never Used  . Alcohol Use: Not on file    Review of Systems: Pertinent items are noted in HPI. no joint involvement. No sob. She does have a productive cough. Does have fever.  running nose, itchy red eyes.   Labs Reviewed: recent labs [place] Reviewed Chart Review for last notes.     Objective:   Filed Vitals:   01/04/12 0855  BP: 132/83  Pulse: 80  Temp: 98.7 F (37.1 C)  TempSrc: Oral  Height: 5' 6.5" (1.689 m)  Weight: 300 lb 3.2 oz (136.17 kg)   Physical Exam: General: aaf, NAD, pleasant Lungs:  Normal respiratory effort, chest expands symmetrically. Lungs are clear to auscultation, no crackles or wheezes. Heart - Regular rate and rhythm.  No murmurs, gallops or rubs.    Skin:  Intact without suspicious lesions or  rashes Throat: erythematous pharynx without exudate Mouth - no lesions, mucous membranes are moist, no decaying teeth  Nose:  Erythematous nasal mucosa, swollen. Left ear: erythematous TM, no bulging. No external tenderness.  Neck:  No deformities, thyromegaly, masses, or tenderness noted.   Supple with full range of motion without pain. Eyes: no conjunctival injection, no discharge.  Assessment & Plan:

## 2012-02-19 ENCOUNTER — Telehealth: Payer: Self-pay | Admitting: *Deleted

## 2012-02-19 NOTE — Telephone Encounter (Signed)
Patient calls stating  Lips have been swollen and dry for two weeks. She has to keep vaseline on them. Also throat is red and sinuses are "red" she states.  Has headache esp when she bends over . Advised that we have no available appointment today  but would recommend she go to Urgent Care for evaluation. She does not want to go there because she has to pay  $50.00 copay. Offered appointment tomorrow AM and this is scheduled.  Advised patient if she feels it is a more urgent problem to go to Urgent Care or ED. She voices understanding.

## 2012-02-20 ENCOUNTER — Encounter: Payer: Self-pay | Admitting: Family Medicine

## 2012-02-20 ENCOUNTER — Ambulatory Visit (INDEPENDENT_AMBULATORY_CARE_PROVIDER_SITE_OTHER): Payer: BC Managed Care – PPO | Admitting: Family Medicine

## 2012-02-20 VITALS — BP 190/84 | HR 80 | Ht 66.5 in | Wt 302.7 lb

## 2012-02-20 DIAGNOSIS — R51 Headache: Secondary | ICD-10-CM

## 2012-02-20 DIAGNOSIS — R519 Headache, unspecified: Secondary | ICD-10-CM | POA: Insufficient documentation

## 2012-02-20 DIAGNOSIS — I10 Essential (primary) hypertension: Secondary | ICD-10-CM

## 2012-02-20 MED ORDER — LISINOPRIL 10 MG PO TABS: 10.0000 mg | ORAL_TABLET | Freq: Every day | ORAL | Status: DC

## 2012-02-20 MED ORDER — FLUTICASONE PROPIONATE 50 MCG/ACT NA SUSP: 2.0000 | Freq: Every day | NASAL | Status: DC

## 2012-02-20 NOTE — Patient Instructions (Addendum)
I think that your headache is probably from sinus congestion. There could also be a component of high blood pressure. For the congestion, you can start flonase and you can take benadryl at night as an antihistamine. You can also take zyrtec or claritin if benadryl makes you too drowsy.   For your blood pressure I am going to start you on lisinopril 10mg . Please follow up with Dr. Madolyn Frieze in 1 week.

## 2012-02-20 NOTE — Progress Notes (Signed)
Patient ID: LILLAH STANDRE, female   DOB: [birth year], 51 y.o.   MRN: [id number] Patient ID: ALEJA YEARWOOD    DOB: 04-Dec-1960, 50 y.o.   MRN: [id number] --- Subjective:  Chavela is a 51 y.o.female who presents with complaint of headache. Headache:  Character: temporal, achey Onset: occurs when she bends head down, improves when comes back to standing Time period of: more noticeable in the last 3.5 weeks but has had these symptoms for "a while now" Course over time: mostly when she bends down, sometimes occurs without position change Severity: moderate Aggravating: bending forward Alleviating: returning to upright position Associated sx/sn: + drainage, no congestion, no nausea, no vomiting, occasionally sees "black stars" when she changes position, no blurred vision or loss of vision.   Hypertension: Stopped HCTZ because of cramps in lower extremities from it. BP's at home: 170's/80's.  No chest pain. No shortness of breath. No lower extremity swelling ROS: see HPI Past Medical History: reviewed and updated medications and allergies. Social History: Tobacco: denies  Objective: Filed Vitals:   02/20/12 0846  BP: 190/84  Pulse: 80   Repeat manual BP: 160/90  Physical Examination:   General appearance - alert, well appearing, and in no distress Ears - difficult to assess given presence of cerumen.  Nose - normal and patent, erythematous and severely inflamed nasal turbinates Mouth - mucous membranes moist, pharynx normal without lesions, lymphoid hyperplasia present Chest - clear to auscultation, no wheezes, rales or rhonchi, symmetric air entry Heart - normal rate, regular rhythm, normal S1, S2, no murmurs, rubs, clicks or gallops Neuro - CN2-12 grossly intact, fundoscopic exam performed but difficult to visualize, 5/5 strength in upper and lower extremities bilaterally Ext - trace pedal edema

## 2012-02-20 NOTE — Assessment & Plan Note (Signed)
Stage 2 hypertension: not currently on meds. HTN could be contributing to headache. Started lisinopril 10mg  daily. reveiwed Cr and K which are within normal limits.

## 2012-02-20 NOTE — Assessment & Plan Note (Addendum)
Start flonase and antihistamine of choice. Likely contributing to headache.

## 2012-02-20 NOTE — Assessment & Plan Note (Signed)
Differential includes: HA from uncontroled HTN vs sinus congestion vs tension headache.  No evidence of space occupying lesion on exam or on history. Difficult to rule out papilledema given poor visualization on fundoscopic exam.  Likely from congestion and HTN. Treated HTN and treated congestion. Reviewed red flags with patient.

## 2012-02-27 ENCOUNTER — Ambulatory Visit: Payer: BC Managed Care – PPO | Admitting: Family Medicine

## 2012-10-14 ENCOUNTER — Other Ambulatory Visit: Payer: Self-pay | Admitting: Family Medicine

## 2012-10-14 MED ORDER — POLYETHYLENE GLYCOL 3350 17 GM/SCOOP PO POWD: 17.0000 g | Freq: Two times a day (BID) | ORAL | Status: DC

## 2013-01-08 ENCOUNTER — Other Ambulatory Visit: Payer: Self-pay

## 2013-01-08 DIAGNOSIS — Z1231 Encounter for screening mammogram for malignant neoplasm of breast: Secondary | ICD-10-CM

## 2013-01-20 ENCOUNTER — Ambulatory Visit
Admission: RE | Admit: 2013-01-20 | Discharge: 2013-01-20 | Disposition: A | Discharge status: Home / Self Care | Payer: BC Managed Care – PPO | Source: Ambulatory Visit

## 2013-01-20 DIAGNOSIS — Z1231 Encounter for screening mammogram for malignant neoplasm of breast: Secondary | ICD-10-CM

## 2013-01-28 ENCOUNTER — Ambulatory Visit (INDEPENDENT_AMBULATORY_CARE_PROVIDER_SITE_OTHER): Payer: BC Managed Care – PPO | Admitting: Family Medicine

## 2013-01-28 VITALS — BP 145/83 | HR 78 | Temp 98.3°F | Ht 67.0 in | Wt 282.0 lb

## 2013-01-28 DIAGNOSIS — G4733 Obstructive sleep apnea (adult) (pediatric): Secondary | ICD-10-CM

## 2013-01-28 DIAGNOSIS — R252 Cramp and spasm: Secondary | ICD-10-CM

## 2013-01-28 DIAGNOSIS — Z9989 Dependence on other enabling machines and devices: Secondary | ICD-10-CM

## 2013-01-28 DIAGNOSIS — I10 Essential (primary) hypertension: Secondary | ICD-10-CM

## 2013-01-28 LAB — COMPREHENSIVE METABOLIC PANEL
Albumin: 4 g/dL (ref 3.5–5.2)
Alkaline Phosphatase: 83 U/L (ref 39–117)
BUN: 14 mg/dL (ref 6–23)
Calcium: 9 mg/dL (ref 8.4–10.5)
Creat: 0.78 mg/dL (ref 0.50–1.10)
Glucose, Bld: 87 mg/dL (ref 70–99)
Potassium: 4 mEq/L (ref 3.5–5.3)

## 2013-01-28 LAB — CBC
HCT: 42.5 % (ref 36.0–46.0)
Hemoglobin: 13.9 g/dL (ref 12.0–15.0)
MCHC: 32.7 g/dL (ref 30.0–36.0)
MCV: 94.4 fL (ref 78.0–100.0)
RDW: 14.6 % (ref 11.5–15.5)

## 2013-01-28 LAB — LIPID PANEL
Cholesterol: 211 mg/dL — ABNORMAL HIGH (ref 0–200)
HDL: 62 mg/dL (ref >39)
Triglycerides: 109 mg/dL (ref <150)
VLDL: 22 mg/dL (ref 0–40)

## 2013-01-28 NOTE — Patient Instructions (Addendum)
Follow-up as needed; in 1 year for your physical   Sleep study Use CPAP machine to see if it helps leg cramping Follow-up if no improvement or worsening

## 2013-01-28 NOTE — Progress Notes (Signed)
  Subjective:    [name], female    DOB: 10/12/1960, 52 y.o.   MRN: [id number]  HPI # Preventative  # Leg cramping at nighttime when she lays down and tries to go to sleep  She is not sure if she moves her legs at nighttime See below regarding OSA  # OSA, supposed to be on CPAP; she needs a new machine but has not been able to afford   # Obesity She lost 20 lbs She attributes working at [employer] and having to walk down the halls  Review of Systems Denies nausea, not constipated on Miralax (she goes about 1-2 times daily) Allergies, medication, past medical history reviewed.  Smoking status noted.  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

## 2013-01-29 DIAGNOSIS — R252 Cramp and spasm: Secondary | ICD-10-CM | POA: Insufficient documentation

## 2013-01-29 HISTORY — DX: Cramp and spasm: R25.2

## 2013-01-29 NOTE — Assessment & Plan Note (Signed)
She has not used for 1-2 years because she has needed a replacement and could not afford.  She is able to afford now.  She is endorsing leg cramping worse at nighttime.  -Refer sleep study to make sure CPAP is at optimal settings (last test done 4 years ago) -Follow-up on leg discomfort; D/Dx: RLS, consider checking electrolytes since she is on anti-hypertensives

## 2013-01-29 NOTE — Assessment & Plan Note (Signed)
See OSA A/P -Follow-up prn if symptoms do not improve back on CPAP or worsen

## 2013-01-29 NOTE — Assessment & Plan Note (Signed)
Commended her on losing weight with increased physical activity at work. Encouraged her to continue to walk/move several times a week to help sustain weight loss.

## 2013-01-29 NOTE — Assessment & Plan Note (Signed)
Fair control. Continue current medications. 

## 2013-01-31 ENCOUNTER — Telehealth: Payer: Self-pay | Admitting: Family Medicine

## 2013-01-31 NOTE — Telephone Encounter (Signed)
Notified of normal/stable labs.

## 2013-03-20 ENCOUNTER — Telehealth: Payer: Self-pay | Admitting: *Deleted

## 2013-03-24 ENCOUNTER — Ambulatory Visit (HOSPITAL_BASED_OUTPATIENT_CLINIC_OR_DEPARTMENT_OTHER): Payer: BC Managed Care – PPO | Attending: Family Medicine | Admitting: Radiology

## 2013-03-24 VITALS — Ht 67.0 in | Wt 285.0 lb

## 2013-03-24 DIAGNOSIS — R5381 Other malaise: Secondary | ICD-10-CM

## 2013-03-24 DIAGNOSIS — G4710 Hypersomnia, unspecified: Secondary | ICD-10-CM

## 2013-03-24 DIAGNOSIS — Z9989 Dependence on other enabling machines and devices: Secondary | ICD-10-CM

## 2013-03-24 DIAGNOSIS — G4733 Obstructive sleep apnea (adult) (pediatric): Secondary | ICD-10-CM | POA: Insufficient documentation

## 2013-03-25 ENCOUNTER — Encounter (HOSPITAL_BASED_OUTPATIENT_CLINIC_OR_DEPARTMENT_OTHER): Payer: BC Managed Care – PPO

## 2013-03-28 DIAGNOSIS — R0989 Other specified symptoms and signs involving the circulatory and respiratory systems: Secondary | ICD-10-CM

## 2013-03-28 DIAGNOSIS — G4733 Obstructive sleep apnea (adult) (pediatric): Secondary | ICD-10-CM

## 2013-03-28 DIAGNOSIS — R0609 Other forms of dyspnea: Secondary | ICD-10-CM

## 2013-03-28 NOTE — Procedures (Signed)
[name], [name]              ACCOUNT NO.:  [id number]  MEDICAL RECORD NO.:  [id number]          PATIENT TYPE:  OUT  LOCATION:  SLEEP CENTER                 FACILITY:  [facility]  PHYSICIAN:  Joan Avetisyan D. Maple Hudson, MD, FCCP, FACPDATE OF BIRTH:  Dec 06, 1960  DATE OF STUDY:  03/24/2013                           NOCTURNAL POLYSOMNOGRAM  REFERRING PHYSICIAN:  Priscella Mann, MD  INDICATION FOR STUDY:  Hypersomnia with sleep apnea.  EPWORTH SLEEPINESS SCORE:  4/24.  BMI 44.6, weight 285 pounds.  Height 67 inches, neck 16.5 inches.  HOME MEDICATIONS:  Charted for review.  SLEEP ARCHITECTURE:  The previous study in 2012 had recorded AHI 57.2 per hour and CPAP was titrated to 15 CWP.  Body weight was 285 pounds for that study.  RESPIRATORY DATA:  Apnea-hypopnea index (AHI) 6.8 per hour.  A total of 37 events were scored, all was hypopneas and most associated with supine sleep position.  REM AHI 3.9 per hour.  There were not enough events to qualify for split protocol CPAP titration.  OXYGEN DATA:  Extremely loud snoring with oxygen desaturation to a nadir of 87% and mean oxygen saturation through the study of 93.6% on room air.  CARDIAC DATA:  Sinus rhythm with PVCs.  MOVEMENT/PARASOMNIA:  There were no significant movement related disturbances.  Bathroom x1.  IMPRESSION/RECOMMENDATIONS: 1. This study was done as a daytime study to accommodate the patient's     usual sleep routine with study onset at 8:26 a.m. and lights on at     14:34 p.m.  No bedtime medication was taken. 2. Mild obstructive sleep apnea/hypopnea syndrome, only hypopneas were     recorded, mostly while in supine position.  AHI 6.8 per hour with     REM AHI 3.9 per hour.  She did not qualify for split protocol CPAP     titration because there were not enough events.  Snoring was     extremely loud with oxygen desaturation to a nadir of 87% and mean     oxygen saturation through the study of 93.6% on room air. 3.  Previous sleep study in 2012 had recorded AHI 57.2 per hour and     CPAP was titrated to 15 CWP.  Body     weight was also recorded as 285 pounds for that study.  It is not     clear why she had a much lower AHI score on the current study.     Clinical correlation required.     Colan Laymon D. Maple Hudson, MD, Tonny Bollman, FACP Diplomate, American Board of Sleep Medicine    CDY/MEDQ  D:  03/28/2013 10:54:21  T:  03/28/2013 13:11:29  Job:  [id number]

## 2013-04-03 ENCOUNTER — Telehealth: Payer: Self-pay | Admitting: Family Medicine

## 2013-04-03 NOTE — Telephone Encounter (Signed)
It appears that she does not need CPAP based on the results of this sleep study. We can discuss it more at her next visit, but for now she does not need CPAP.  Thanks, [employer]. [place], M.D.

## 2013-04-03 NOTE — Telephone Encounter (Signed)
Pt wants to know results of sleep study Please advise

## 2013-04-03 NOTE — Telephone Encounter (Signed)
Will forward to Dr Mikel Cella

## 2013-04-03 NOTE — Telephone Encounter (Signed)
Pt informed of result.

## 2013-05-04 NOTE — Telephone Encounter (Signed)
Opened in error.   Trilby Way L, CMA  

## 2013-09-07 ENCOUNTER — Ambulatory Visit (HOSPITAL_COMMUNITY)
Admission: RE | Admit: 2013-09-07 | Discharge: 2013-09-07 | Disposition: A | Discharge status: Home / Self Care | Payer: Managed Care, Other (non HMO) | Source: Ambulatory Visit | Attending: Family Medicine | Admitting: Family Medicine

## 2013-09-07 ENCOUNTER — Encounter: Payer: Self-pay | Admitting: Family Medicine

## 2013-09-07 ENCOUNTER — Ambulatory Visit (INDEPENDENT_AMBULATORY_CARE_PROVIDER_SITE_OTHER): Payer: Managed Care, Other (non HMO) | Admitting: Family Medicine

## 2013-09-07 VITALS — BP 146/75 | HR 83 | Temp 99.0°F | Resp 18 | Wt 277.0 lb

## 2013-09-07 DIAGNOSIS — I10 Essential (primary) hypertension: Secondary | ICD-10-CM

## 2013-09-07 DIAGNOSIS — R079 Chest pain, unspecified: Secondary | ICD-10-CM | POA: Insufficient documentation

## 2013-09-07 DIAGNOSIS — R1904 Left lower quadrant abdominal swelling, mass and lump: Secondary | ICD-10-CM

## 2013-09-07 DIAGNOSIS — L659 Nonscarring hair loss, unspecified: Secondary | ICD-10-CM

## 2013-09-07 DIAGNOSIS — Z01818 Encounter for other preprocedural examination: Secondary | ICD-10-CM

## 2013-09-07 LAB — COMPREHENSIVE METABOLIC PANEL
ALBUMIN: 4.5 g/dL (ref 3.5–5.2)
ALT: 18 U/L (ref 0–35)
AST: 16 U/L (ref 0–37)
Alkaline Phosphatase: 80 U/L (ref 39–117)
BUN: 11 mg/dL (ref 6–23)
CALCIUM: 8.9 mg/dL (ref 8.4–10.5)
CHLORIDE: 105 meq/L (ref 96–112)
CO2: 28 meq/L (ref 19–32)
CREATININE: 0.79 mg/dL (ref 0.50–1.10)
Glucose, Bld: 92 mg/dL (ref 70–99)
Potassium: 3.9 mEq/L (ref 3.5–5.3)
Sodium: 142 mEq/L (ref 135–145)
TOTAL PROTEIN: 6.5 g/dL (ref 6.0–8.3)
Total Bilirubin: 0.5 mg/dL (ref 0.3–1.2)

## 2013-09-07 LAB — LIPID PANEL
CHOL/HDL RATIO: 4.7 ratio
CHOLESTEROL: 228 mg/dL — AB (ref 0–200)
HDL: 49 mg/dL (ref >39)
LDL Cholesterol: 153 mg/dL — ABNORMAL HIGH (ref 0–99)
Triglycerides: 128 mg/dL (ref <150)
VLDL: 26 mg/dL (ref 0–40)

## 2013-09-07 LAB — CBC
HCT: 39.8 % (ref 36.0–46.0)
Hemoglobin: 13.4 g/dL (ref 12.0–15.0)
MCH: 31.4 pg (ref 26.0–34.0)
MCHC: 33.7 g/dL (ref 30.0–36.0)
MCV: 93.2 fL (ref 78.0–100.0)
PLATELETS: 201 10*3/uL (ref 150–400)
RBC: 4.27 MIL/uL (ref 3.87–5.11)
RDW: 13.9 % (ref 11.5–15.5)
WBC: 6.3 10*3/uL (ref 4.0–10.5)

## 2013-09-07 MED ORDER — CLOBETASOL PROPIONATE 0.05 % EX SHAM: 1.0000 "application " | MEDICATED_SHAMPOO | Freq: Every day | CUTANEOUS | Status: DC

## 2013-09-07 NOTE — Assessment & Plan Note (Signed)
Unsure of etiology, but likely due to age. Will try Clabetosol shampoo x6 weeks and follow up as needed.

## 2013-09-07 NOTE — Progress Notes (Signed)
[name], female   DOB: 1960-10-09, 53 y.o.   MRN: [id number]    Subjective: HPI: Patient is a 53 y.o. female presenting to clinic today for physical and pre-op clearance, she is also concerned about her ventral hernia and hair loss.   Assessment:  Pre-op Clearance: with planned surgery left total knee replacement on Feb 17. Dr. Tyrone SchimkeMurphy Jr. Is doing the surgery. She does not have any concerns about the surgery.  Known risk factors for perioperative complications: None.   Difficulty with intubation is not anticipated. Current medications which may produce withdrawal symptoms if withheld perioperatively: None.  Cardiac Risk Estimation: per the Revised Cardiac Risk Index (Circ. 100:1043, 1999), the patient's risk factors for cardiac complications include none, putting her in: RCI RISK CLASS I (0 risk factors, risk of major cardiac compl. appr. 0.5%).  Hernia- Feels pulling or dull aching in the area. Hernia is from her last oophorectomy. She takes laxative when she feels extra stool. She denies pain but does have occasional sharp, shooting discomfort with turning or twisting the wrong way.   Hair loss- Patient with hair thinning. She is s/p hysterectomy with oophorectomy in 2010. No recent hormonal changes. She is concerned about this since she "takes pride" in her thick hair.  History Reviewed: Non-smoker. Health Maintenance: UTD on mammogram, UTD colonoscopy, UTD pap smear. Had flu shot at [facility]. Needs CBC, Cmet and lipid today.  ROS: Please see HPI above.  Objective: Office vital signs reviewed. BP 146/75  Pulse 83  Temp(Src) 99 F (37.2 C) (Oral)  Resp 18  Wt 277 lb (125.646 kg)  SpO2 96%  Physical Examination:  General: Awake, alert. NAD.  HEENT: Atraumatic, normocephalic. MMM. Posterior pharnyx clear with 2+ tonsils.  Neck: No masses palpated. Shotty LAD. Pulm: CTAB, no wheezes Cardio: RRR, no murmurs appreciated Abdomen: Obese, +BS, soft, nontender,  nondistended. Possible abd wall defect under pannus, but difficulty appreciating full extent of hernia on my exam. Extremities: Trace edema Neuro: Grossly intact   Plan:  1. Preoperative workup as follows ECG, hemoglobin, hematocrit, electrolytes, creatinine, liver function studies. 2. Change in medication regimen before surgery: discontinue NSAIDs (Celebrex) 14 days before surgery. 3. Prophylaxis for cardiac events with perioperative beta-blockers: not indicated. 4. Invasive hemodynamic monitoring perioperatively: not indicated. 5. Deep vein thrombosis prophylaxis postoperatively:regimen to be chosen by surgical team. 6. Surveillance for postoperative MI with ECG immediately postoperatively and on postoperative days 1 and 2 AND troponin levels 24 hours postoperatively and on day 4 or hospital discharge (whichever comes first): not indicated. 7. Other measures: n/a

## 2013-09-07 NOTE — Assessment & Plan Note (Signed)
Exam normal, ECG normal, awaiting lab results. Cleared for surgery as long as renal and hepatic function normal.

## 2013-09-07 NOTE — Assessment & Plan Note (Signed)
Ventral hernia seen on CT scan in 2013 without incarceration. No signs of incarceration today either, but since she is having increased symptoms, I have offered referral to surgery for repair. Patient would like to think about this and let me know what she decides.

## 2013-09-07 NOTE — Patient Instructions (Signed)
You are medically appropriate for your knee surgery.  If you feel like you want to be evaluated by surgery for your hernia, please let me know and we will put in the referral.  I will send you a letter with your labs on it, but if anything is abnormal, I will call you.  Good luck! Amoni Scallan M. Kahiau Schewe, M.D.

## 2013-09-07 NOTE — Assessment & Plan Note (Signed)
BP elevated at triage from pain and ambulation. ECG wnl, and heart sounds normal. This is not a cardiac risk factor for surgery, however it should be followed up. Not taking Lisinopril currently. F/u within next few months.

## 2013-09-21 NOTE — H&P (Signed)
[name] [address]   [address] [place], [place] [phone] A Division of [facility]  [name], M.D.   [name], M.D.   [name], M.D.   [name], M.D.   [name], M.D. [name], M.D.  [name], M.D.  [name], M.D.    [name], M.D. Anne L. Isidoro DonningAnton, PA-C  Kirstin A. Shepperson, PA-C  Josh Woodlandhadwell, PA-C [place], [place]    [name]   [id number]      DOB: November 19, 1960 ADMISSION HISTORY & PHYSICAL: 08-31-13  History of present illness:  [name] is a very pleasant 53 year old female with a history of left knee end-stage degenerative joint disease and chronic pain who returns for follow-up.  Symptoms are unchanged from previous visit and she wishes to proceed with left total knee arthroplasty as scheduled.  She has significant pain with activities affecting her quality of life and ability to perform activities of daily living.  She has failed to respond to conservative treatment options.      Current medications include Celebrex, Demerol, Phenergan, and Lisinopril.    ALLERGIES:  NSAIDs.  She also gets very sick under anesthesia.  She states she gets very sick with nausea, vomiting with Hydrocodone and Oxycodone even when paired with Phenergan.    She denies any history of cardiac or pulmonary problems. No history of MI, stroke, blood clots or diabetes.    Social History: She is married and lives with her husband.    She currently denies any lightheadedness, dizziness, fevers, chills, chest pain, pressure, palpitations, shortness of breath or any other pulmonary, GI, GU or neuro issues.   EXAMINATION: She is alert and oriented x 3 and in no acute distress.  She does have an antalgic gait on the left.  Head is normocephalic, and atraumatic.  PERRLA, EOM's intact.  Neck is unremarkable.  Examination of her left knee reveals 3+  joint effusion.  Range of motion is from about 0 degrees extension to about 90 degrees of flexion.  Patellofemoral crepitus.  Ligaments are stable.  She does have a varus deformity.    X-RAYS: Previous imaging of her left knee demonstrates end-stage degenerative joint changes.   Continued-----    [name] [address]   [address] [place],  [id number] [phone] A Division of [facility]  [name], M.D.   [name], M.D.   [name], M.D.   [name], M.D.   [name], M.D. [name], M.D.  [name], M.D.  [name], M.D.    [name], M.D. Anne L. Isidoro DonningAnton, PA-C  Kirstin A. Shepperson, PA-C  Josh Rainierhadwell, PA-C [place], [place]    [name]   [id number]      DOB: November 19, 1960 ADMISSION HISTORY & PHYSICAL: 08-31-13 Page 2  IMPRESSION: Left knee degenerative joint changes and chronic pain, which has failed to respond to conservative treatment.    PLAN: We will proceed with left total knee arthroplasty as planned.  We discussed risks, benefits and possible complications of surgery.  Rehab and recovery time has been discussed.  All questions are answered.  The patient will need Home Health at the time of discharge.  We will be using Aspirin 325 mg. for deep venous thrombosis  prophylaxis postoperatively.   The patient has discussed that she is currently  on Demerol which is what works best as far as pain control without side effects.  She states the Hydrocodone and Oxycodone even when paired with Phenergan make her very nauseous and give her lots of nausea and vomiting.  She is also stating that she keloids very easily.     Anne Baize.  Eulah Pont, M.D.  Electronically verified by Anne Baize. Eulah Pont, M.D. TDM(LA):gde D 08-31-13 T 09-01-13

## 2013-09-22 ENCOUNTER — Ambulatory Visit (INDEPENDENT_AMBULATORY_CARE_PROVIDER_SITE_OTHER): Payer: Managed Care, Other (non HMO) | Admitting: Family Medicine

## 2013-09-22 ENCOUNTER — Encounter: Payer: Self-pay | Admitting: Family Medicine

## 2013-09-22 VITALS — BP 165/82 | HR 88 | Ht 67.0 in | Wt 273.0 lb

## 2013-09-22 DIAGNOSIS — R300 Dysuria: Secondary | ICD-10-CM

## 2013-09-22 LAB — POCT URINALYSIS DIPSTICK
Bilirubin, UA: NEGATIVE
Glucose, UA: NEGATIVE
Ketones, UA: NEGATIVE
Nitrite, UA: NEGATIVE
PH UA: 6.5
PROTEIN UA: NEGATIVE
RBC UA: NEGATIVE
SPEC GRAV UA: 1.025
Urobilinogen, UA: 1

## 2013-09-22 LAB — POCT UA - MICROSCOPIC ONLY

## 2013-09-22 MED ORDER — FLUCONAZOLE 150 MG PO TABS: 150.0000 mg | ORAL_TABLET | Freq: Every day | ORAL | Status: DC

## 2013-09-22 MED ORDER — CIPROFLOXACIN HCL 500 MG PO TABS: 500.0000 mg | ORAL_TABLET | Freq: Two times a day (BID) | ORAL | Status: DC

## 2013-09-22 NOTE — Assessment & Plan Note (Signed)
Check urine culture Rx with Cipro--diflucan for subsequent yeast.

## 2013-09-22 NOTE — Patient Instructions (Signed)
Urinary Tract Infection  Urinary tract infections (UTIs) can develop anywhere along your urinary tract. Your urinary tract is your body's drainage system for removing wastes and extra water. Your urinary tract includes two kidneys, two ureters, a bladder, and a urethra. Your kidneys are a pair of bean-shaped organs. Each kidney is about the size of your fist. They are located below your ribs, one on each side of your spine.  CAUSES  Infections are caused by microbes, which are microscopic organisms, including fungi, viruses, and bacteria. These organisms are so small that they can only be seen through a microscope. Bacteria are the microbes that most commonly cause UTIs.  SYMPTOMS   Symptoms of UTIs may vary by age and gender of the patient and by the location of the infection. Symptoms in young women typically include a frequent and intense urge to urinate and a painful, burning feeling in the bladder or urethra during urination. Older women and men are more likely to be tired, shaky, and weak and have muscle aches and abdominal pain. A fever may mean the infection is in your kidneys. Other symptoms of a kidney infection include pain in your back or sides below the ribs, nausea, and vomiting.  DIAGNOSIS  To diagnose a UTI, your caregiver will ask you about your symptoms. Your caregiver also will ask to provide a urine sample. The urine sample will be tested for bacteria and white blood cells. White blood cells are made by your body to help fight infection.  TREATMENT   Typically, UTIs can be treated with medication. Because most UTIs are caused by a bacterial infection, they usually can be treated with the use of antibiotics. The choice of antibiotic and length of treatment depend on your symptoms and the type of bacteria causing your infection.  HOME CARE INSTRUCTIONS   If you were prescribed antibiotics, take them exactly as your caregiver instructs you. Finish the medication even if you feel better after you  have only taken some of the medication.   Drink enough water and fluids to keep your urine clear or pale yellow.   Avoid caffeine, tea, and carbonated beverages. They tend to irritate your bladder.   Empty your bladder often. Avoid holding urine for long periods of time.   Empty your bladder before and after sexual intercourse.   After a bowel movement, women should cleanse from front to back. Use each tissue only once.  SEEK MEDICAL CARE IF:    You have back pain.   You develop a fever.   Your symptoms do not begin to resolve within 3 days.  SEEK IMMEDIATE MEDICAL CARE IF:    You have severe back pain or lower abdominal pain.   You develop chills.   You have nausea or vomiting.   You have continued burning or discomfort with urination.  MAKE SURE YOU:    Understand these instructions.   Will watch your condition.   Will get help right away if you are not doing well or get worse.  Document Released: 05/16/2005 Document Revised: 02/05/2012 Document Reviewed: 09/14/2011  ExitCare Patient Information 2014 [employer].

## 2013-09-22 NOTE — Progress Notes (Signed)
   Subjective:    Patient ID: Anne OchsBonnie L Rubendall, female    DOB: 04/05/1961, 53 y.o.   MRN: [id number]  Urinary Tract Infection  This is a new problem. The current episode started in the past 7 days. The problem has been gradually worsening. The quality of the pain is described as burning. The pain is moderate. The maximum temperature recorded prior to her arrival was 100 - 100.9 F. The fever has been present for 1 - 2 days. She is not sexually active. There is no history of pyelonephritis. Associated symptoms include frequency and urgency. Her past medical history is significant for recurrent UTIs.      Review of Systems  Respiratory: Negative for shortness of breath.   Cardiovascular: Negative for leg swelling.  Gastrointestinal: Negative for abdominal pain.  Genitourinary: Positive for urgency, frequency and pelvic pain (pressure).       Objective:   Physical Exam  Vitals reviewed. Constitutional: She appears well-developed and well-nourished. No distress.  HENT:  Head: Normocephalic and atraumatic.  Eyes: No scleral icterus.  Neck: Neck supple.  Cardiovascular: Normal rate.   Pulmonary/Chest: Effort normal.  Abdominal: Soft. There is no tenderness.  Musculoskeletal: She exhibits no tenderness (no CVA tenderness).  Neurological: She is alert.  Skin: Skin is warm.  Psychiatric: She has a normal mood and affect.   Labs: Urinalysis    Component Value Date/Time   COLORURINE yellow 09/26/2010 1012   APPEARANCEUR Clear 09/26/2010 1012   LABSPEC 1.025 09/26/2010 1012   PHURINE 5.5 09/26/2010 1012   GLUCOSEU NEGATIVE 04/27/2010 2332   HGBUR negative 09/26/2010 1012   HGBUR NEGATIVE 04/27/2010 2332   BILIRUBINUR NEGATIVE 09/22/2013 1035   BILIRUBINUR negative 09/26/2010 1012   KETONESUR NEGATIVE 04/27/2010 2332   PROTEINUR NEGATIVE 04/27/2010 2332   UROBILINOGEN 1.0 09/22/2013 1035   UROBILINOGEN 0.2 09/26/2010 1012   NITRITE NEGATIVE 09/22/2013 1035   NITRITE negative 09/26/2010 1012   LEUKOCYTESUR  small (1+) 09/22/2013 1035             Assessment & Plan:

## 2013-09-25 ENCOUNTER — Encounter (HOSPITAL_COMMUNITY): Payer: Self-pay | Admitting: Pharmacy Technician

## 2013-09-26 NOTE — Pre-Procedure Instructions (Addendum)
[name]  09/26/2013   Your procedure is scheduled on:  February 17  Report to [facility] Entrance "A" [address] at [employer] AM.  Call this number if you have problems the morning of surgery: [phone]   Remember:   Do not eat food or drink liquids after midnight.   Take these medicines the morning of surgery with A SIP OF WATER: Nasal Spray, Meperidine (if needed), Omeprazole, Phenergan (if needed)   STOP Celebrex 5 days prior to surgery   STOP/ Do not take Aspirin, Aleve, Naproxen, Advil, Ibuprofen, Vitamin, Herbs, or Supplement 5 days prior to surgery   Do not wear jewelry, make-up or nail polish.  Do not wear lotions, powders, or perfumes. You may wear deodorant.  Do not shave 48 hours prior to surgery. Men may shave face and neck.  Do not bring valuables to the hospital.  [facility] is not responsible for any belongings or valuables.               Contacts, dentures or bridgework may not be worn into surgery.  Leave suitcase in the car. After surgery it may be brought to your room.  For patients admitted to the hospital, discharge time is determined by your  treatment team.               Special Instructions: Use CHG soap as directed below   Please read over the following fact sheets that you were given: Pain Booklet, Coughing and Deep Breathing, Blood Transfusion Information, Total Joint Packet and Surgical Site Infection Prevention    [place] - Preparing for Surgery  Before surgery, you can play an important role.  Because skin is not sterile, your skin needs to be as free of germs as possible.  You can reduce the number of germs on you skin by washing with CHG (chlorahexidine gluconate) soap before surgery.  CHG is an antiseptic cleaner which kills germs and bonds with the skin to continue killing germs even after washing.  Please DO NOT use if you have an allergy to CHG or antibacterial soaps.  If your skin becomes  reddened/irritated stop using the CHG and inform your nurse when you arrive at Short Stay.  Do not shave (including legs and underarms) for at least 48 hours prior to the first CHG shower.  You may shave your face.  Please follow these instructions carefully:   1.  Shower with CHG Soap the night before surgery and the  morning of Surgery.  2.  If you choose to wash your hair, wash your hair first as usual with your normal shampoo.  3.  After you shampoo, rinse your hair and body thoroughly to remove the Shampoo.  4.  Use CHG as you would any other liquid soap.  You can apply chg directly  to the skin and wash gently with scrungie or a clean washcloth.  5.  Apply the CHG Soap to your body ONLY FROM THE NECK DOWN.     Do not use on open wounds or open sores.  Avoid contact with your eyes,ears, mouth and genitals (private parts).  Wash genitals (private parts)       with your normal soap.  6.  Wash thoroughly, paying special attention to the area where your surgery will be performed.  7.  Thoroughly rinse your body with warm water from the neck down.  8.  DO NOT shower/wash with your normal soap after using and  rinsing off the CHG Soap.  9.  Pat yourself dry with a clean towel.            10.  Wear clean pajamas.            11.  Place clean sheets on your bed the night of your first shower and do not sleep with pets.  Day of Surgery  Do not apply any lotions the morning of surgery.  Please wear clean clothes to the hospital/surgery center.

## 2013-09-28 ENCOUNTER — Other Ambulatory Visit (HOSPITAL_COMMUNITY): Payer: Self-pay | Admitting: *Deleted

## 2013-09-28 ENCOUNTER — Encounter (HOSPITAL_COMMUNITY)
Admission: RE | Admit: 2013-09-28 | Discharge: 2013-09-28 | Disposition: A | Discharge status: Home / Self Care | Payer: Managed Care, Other (non HMO) | Source: Ambulatory Visit | Attending: Orthopedic Surgery | Admitting: Orthopedic Surgery

## 2013-09-28 ENCOUNTER — Encounter (HOSPITAL_COMMUNITY): Payer: Self-pay

## 2013-09-28 DIAGNOSIS — Z01812 Encounter for preprocedural laboratory examination: Secondary | ICD-10-CM | POA: Insufficient documentation

## 2013-09-28 HISTORY — DX: Urinary tract infection, site not specified: N39.0

## 2013-09-28 HISTORY — DX: Essential (primary) hypertension: I10

## 2013-09-28 HISTORY — DX: Other specified postprocedural states: Z98.890

## 2013-09-28 HISTORY — DX: Unspecified osteoarthritis, unspecified site: M19.90

## 2013-09-28 HISTORY — DX: Gastro-esophageal reflux disease without esophagitis: K21.9

## 2013-09-28 HISTORY — DX: Nausea with vomiting, unspecified: R11.2

## 2013-09-28 HISTORY — DX: Sleep apnea, unspecified: G47.30

## 2013-09-28 HISTORY — DX: Umbilical hernia without obstruction or gangrene: K42.9

## 2013-09-28 LAB — COMPREHENSIVE METABOLIC PANEL
ALBUMIN: 3.9 g/dL (ref 3.5–5.2)
ALT: 16 U/L (ref 0–35)
AST: 16 U/L (ref 0–37)
Alkaline Phosphatase: 93 U/L (ref 39–117)
BUN: 12 mg/dL (ref 6–23)
CO2: 24 mEq/L (ref 19–32)
CREATININE: 0.82 mg/dL (ref 0.50–1.10)
Calcium: 9.1 mg/dL (ref 8.4–10.5)
Chloride: 107 mEq/L (ref 96–112)
GFR calc Af Amer: >90 mL/min (ref >90)
GFR calc non Af Amer: 80 mL/min — ABNORMAL LOW (ref >90)
Glucose, Bld: 96 mg/dL (ref 70–99)
Potassium: 4 mEq/L (ref 3.7–5.3)
SODIUM: 144 meq/L (ref 137–147)
TOTAL PROTEIN: 7 g/dL (ref 6.0–8.3)
Total Bilirubin: <0.2 mg/dL — ABNORMAL LOW (ref 0.3–1.2)

## 2013-09-28 LAB — URINALYSIS, ROUTINE W REFLEX MICROSCOPIC
Bilirubin Urine: NEGATIVE
Glucose, UA: NEGATIVE mg/dL
Hgb urine dipstick: NEGATIVE
KETONES UR: NEGATIVE mg/dL
NITRITE: NEGATIVE
PH: 6 (ref 5.0–8.0)
Protein, ur: NEGATIVE mg/dL
Specific Gravity, Urine: 1.018 (ref 1.005–1.030)
Urobilinogen, UA: 0.2 mg/dL (ref 0.0–1.0)

## 2013-09-28 LAB — CBC
HCT: 39.4 % (ref 36.0–46.0)
Hemoglobin: 13.5 g/dL (ref 12.0–15.0)
MCH: 31.5 pg (ref 26.0–34.0)
MCHC: 34.3 g/dL (ref 30.0–36.0)
MCV: 91.8 fL (ref 78.0–100.0)
PLATELETS: 207 10*3/uL (ref 150–400)
RBC: 4.29 MIL/uL (ref 3.87–5.11)
RDW: 13.3 % (ref 11.5–15.5)
WBC: 6.1 10*3/uL (ref 4.0–10.5)

## 2013-09-28 LAB — PROTIME-INR
INR: 1.08 (ref 0.00–1.49)
Prothrombin Time: 13.8 seconds (ref 11.6–15.2)

## 2013-09-28 LAB — APTT: APTT: 32 s (ref 24–37)

## 2013-09-28 LAB — URINE MICROSCOPIC-ADD ON

## 2013-09-28 LAB — ABO/RH: ABO/RH(D): A POS

## 2013-09-28 LAB — TYPE AND SCREEN
ABO/RH(D): A POS
Antibody Screen: NEGATIVE

## 2013-09-28 LAB — SURGICAL PCR SCREEN
MRSA, PCR: NEGATIVE
STAPHYLOCOCCUS AUREUS: NEGATIVE

## 2013-09-28 NOTE — Progress Notes (Signed)
Office called re: ua. Patient had uti and did not finish abx due to granddtr throwing meds in toilet .only took 2 days. Some wbc,s in ua in todays sample.

## 2013-09-29 LAB — URINE CULTURE
CULTURE: NO GROWTH
Colony Count: NO GROWTH

## 2013-10-05 MED ORDER — DEXTROSE 5 % IV SOLN
3.0000 g | INTRAVENOUS | Status: AC
  Administered 2013-10-06: 3 g via INTRAVENOUS
  Filled 2013-10-05: qty 3000

## 2013-10-05 MED ORDER — ACETAMINOPHEN 500 MG PO TABS
1000.0000 mg | ORAL_TABLET | Freq: Once | ORAL | Status: AC
  Administered 2013-10-06: 1000 mg via ORAL
  Filled 2013-10-05: qty 2

## 2013-10-05 NOTE — Progress Notes (Addendum)
Left message for patient to call back re; time change.  Patient called back and was instructed to arrive at 630 am on 10/06/13.

## 2013-10-06 ENCOUNTER — Inpatient Hospital Stay (HOSPITAL_COMMUNITY): Payer: Managed Care, Other (non HMO)

## 2013-10-06 ENCOUNTER — Inpatient Hospital Stay (HOSPITAL_COMMUNITY)
Admission: RE | Admit: 2013-10-06 | Discharge: 2013-10-09 | DRG: 470 | Disposition: A | Discharge status: Home Health | Payer: Managed Care, Other (non HMO) | Source: Ambulatory Visit | Attending: Orthopedic Surgery | Admitting: Orthopedic Surgery

## 2013-10-06 ENCOUNTER — Encounter (HOSPITAL_COMMUNITY)
Admission: RE | Disposition: A | Discharge status: Home Health | Payer: Self-pay | Source: Ambulatory Visit | Attending: Orthopedic Surgery

## 2013-10-06 ENCOUNTER — Inpatient Hospital Stay (HOSPITAL_COMMUNITY): Payer: Managed Care, Other (non HMO) | Admitting: Anesthesiology

## 2013-10-06 ENCOUNTER — Encounter (HOSPITAL_COMMUNITY): Payer: Managed Care, Other (non HMO) | Admitting: Anesthesiology

## 2013-10-06 DIAGNOSIS — M1710 Unilateral primary osteoarthritis, unspecified knee: Principal | ICD-10-CM | POA: Diagnosis present

## 2013-10-06 DIAGNOSIS — M1990 Unspecified osteoarthritis, unspecified site: Secondary | ICD-10-CM

## 2013-10-06 DIAGNOSIS — Z79899 Other long term (current) drug therapy: Secondary | ICD-10-CM

## 2013-10-06 DIAGNOSIS — I10 Essential (primary) hypertension: Secondary | ICD-10-CM | POA: Diagnosis present

## 2013-10-06 DIAGNOSIS — K219 Gastro-esophageal reflux disease without esophagitis: Secondary | ICD-10-CM | POA: Diagnosis present

## 2013-10-06 DIAGNOSIS — Z7982 Long term (current) use of aspirin: Secondary | ICD-10-CM

## 2013-10-06 DIAGNOSIS — G8918 Other acute postprocedural pain: Secondary | ICD-10-CM | POA: Diagnosis not present

## 2013-10-06 HISTORY — PX: TOTAL KNEE ARTHROPLASTY: SHX125

## 2013-10-06 SURGERY — ARTHROPLASTY, KNEE, TOTAL
Anesthesia: General | Site: Knee | Laterality: Left

## 2013-10-06 MED ORDER — DEXAMETHASONE SODIUM PHOSPHATE 4 MG/ML IJ SOLN
INTRAMUSCULAR | Status: AC
  Filled 2013-10-06: qty 1

## 2013-10-06 MED ORDER — OXYCODONE HCL 5 MG PO TABS
ORAL_TABLET | ORAL | Status: AC
  Filled 2013-10-06: qty 1

## 2013-10-06 MED ORDER — MORPHINE SULFATE 2 MG/ML IJ SOLN
INTRAMUSCULAR | Status: AC
  Filled 2013-10-06: qty 1

## 2013-10-06 MED ORDER — LIDOCAINE HCL (CARDIAC) 20 MG/ML IV SOLN
INTRAVENOUS | Status: DC | PRN
  Administered 2013-10-06: 100 mg via INTRAVENOUS

## 2013-10-06 MED ORDER — OXYCODONE HCL 5 MG/5ML PO SOLN: 5.0000 mg | Freq: Once | ORAL | Status: AC | PRN

## 2013-10-06 MED ORDER — NEOSTIGMINE METHYLSULFATE 1 MG/ML IJ SOLN
INTRAMUSCULAR | Status: AC
  Filled 2013-10-06: qty 10

## 2013-10-06 MED ORDER — MIDAZOLAM HCL 2 MG/2ML IJ SOLN
1.0000 mg | INTRAMUSCULAR | Status: DC | PRN
  Administered 2013-10-06: 2 mg via INTRAVENOUS
  Filled 2013-10-06: qty 2

## 2013-10-06 MED ORDER — HYDROMORPHONE HCL PF 1 MG/ML IJ SOLN
INTRAMUSCULAR | Status: AC
  Administered 2013-10-06: 0.5 mg via INTRAVENOUS
  Filled 2013-10-06: qty 1

## 2013-10-06 MED ORDER — ASPIRIN EC 325 MG PO TBEC
325.0000 mg | DELAYED_RELEASE_TABLET | Freq: Every day | ORAL | Status: DC
  Administered 2013-10-07 – 2013-10-09 (×3): 325 mg via ORAL
  Filled 2013-10-06 (×4): qty 1

## 2013-10-06 MED ORDER — METOCLOPRAMIDE HCL 5 MG/ML IJ SOLN
5.0000 mg | Freq: Three times a day (TID) | INTRAMUSCULAR | Status: DC | PRN
  Administered 2013-10-06 – 2013-10-07 (×2): 10 mg via INTRAVENOUS
  Filled 2013-10-06 (×2): qty 2

## 2013-10-06 MED ORDER — LISINOPRIL 10 MG PO TABS
10.0000 mg | ORAL_TABLET | Freq: Every day | ORAL | Status: DC
  Administered 2013-10-06 – 2013-10-09 (×4): 10 mg via ORAL
  Filled 2013-10-06 (×4): qty 1

## 2013-10-06 MED ORDER — MORPHINE SULFATE 2 MG/ML IJ SOLN
2.0000 mg | INTRAMUSCULAR | Status: DC | PRN
  Administered 2013-10-06 – 2013-10-07 (×12): 2 mg via INTRAVENOUS
  Filled 2013-10-06 (×10): qty 1

## 2013-10-06 MED ORDER — BUPIVACAINE-EPINEPHRINE PF 0.5-1:200000 % IJ SOLN
INTRAMUSCULAR | Status: DC | PRN
  Administered 2013-10-06: 25 mL

## 2013-10-06 MED ORDER — SODIUM CHLORIDE 0.9 % IR SOLN
Status: DC | PRN
  Administered 2013-10-06: 2000 mL

## 2013-10-06 MED ORDER — BUPIVACAINE LIPOSOME 1.3 % IJ SUSP
20.0000 mL | INTRAMUSCULAR | Status: AC
  Administered 2013-10-06: 266 mg
  Filled 2013-10-06: qty 20

## 2013-10-06 MED ORDER — LABETALOL HCL 5 MG/ML IV SOLN
INTRAVENOUS | Status: DC | PRN
  Administered 2013-10-06 (×2): 5 mg via INTRAVENOUS

## 2013-10-06 MED ORDER — GLYCOPYRROLATE 0.2 MG/ML IJ SOLN
INTRAMUSCULAR | Status: AC
  Filled 2013-10-06: qty 1

## 2013-10-06 MED ORDER — DEXTROSE 5 % IV SOLN
3.0000 g | Freq: Four times a day (QID) | INTRAVENOUS | Status: AC
  Administered 2013-10-06 (×2): 3 g via INTRAVENOUS
  Filled 2013-10-06 (×2): qty 3000

## 2013-10-06 MED ORDER — ROCURONIUM BROMIDE 50 MG/5ML IV SOLN
INTRAVENOUS | Status: AC
  Filled 2013-10-06: qty 1

## 2013-10-06 MED ORDER — LABETALOL HCL 5 MG/ML IV SOLN
INTRAVENOUS | Status: AC
  Filled 2013-10-06: qty 4

## 2013-10-06 MED ORDER — MIDAZOLAM HCL 5 MG/5ML IJ SOLN
INTRAMUSCULAR | Status: DC | PRN
  Administered 2013-10-06: 1 mg via INTRAVENOUS

## 2013-10-06 MED ORDER — OXYCODONE HCL 5 MG PO TABS
5.0000 mg | ORAL_TABLET | ORAL | Status: DC | PRN
  Administered 2013-10-06 – 2013-10-07 (×6): 10 mg via ORAL
  Filled 2013-10-06 (×7): qty 2

## 2013-10-06 MED ORDER — PROPOFOL 10 MG/ML IV BOLUS
INTRAVENOUS | Status: DC | PRN
  Administered 2013-10-06: 200 mg via INTRAVENOUS

## 2013-10-06 MED ORDER — ONDANSETRON HCL 4 MG PO TABS
4.0000 mg | ORAL_TABLET | Freq: Four times a day (QID) | ORAL | Status: DC | PRN
  Administered 2013-10-06 – 2013-10-09 (×6): 4 mg via ORAL
  Filled 2013-10-06 (×6): qty 1

## 2013-10-06 MED ORDER — SUFENTANIL CITRATE 50 MCG/ML IV SOLN
INTRAVENOUS | Status: DC | PRN
  Administered 2013-10-06: 20 ug via INTRAVENOUS
  Administered 2013-10-06 (×2): 15 ug via INTRAVENOUS

## 2013-10-06 MED ORDER — ACETAMINOPHEN 325 MG PO TABS
650.0000 mg | ORAL_TABLET | Freq: Four times a day (QID) | ORAL | Status: DC | PRN
  Administered 2013-10-08 – 2013-10-09 (×4): 650 mg via ORAL
  Filled 2013-10-06 (×4): qty 2

## 2013-10-06 MED ORDER — FENTANYL CITRATE 0.05 MG/ML IJ SOLN
INTRAMUSCULAR | Status: DC | PRN
  Administered 2013-10-06: 50 ug via INTRAVENOUS

## 2013-10-06 MED ORDER — DOCUSATE SODIUM 100 MG PO CAPS
100.0000 mg | ORAL_CAPSULE | Freq: Two times a day (BID) | ORAL | Status: DC
  Administered 2013-10-06 – 2013-10-09 (×6): 100 mg via ORAL
  Filled 2013-10-06 (×7): qty 1

## 2013-10-06 MED ORDER — LACTATED RINGERS IV SOLN
INTRAVENOUS | Status: DC | PRN
  Administered 2013-10-06 (×2): via INTRAVENOUS

## 2013-10-06 MED ORDER — PROPOFOL 10 MG/ML IV BOLUS
INTRAVENOUS | Status: AC
  Filled 2013-10-06: qty 20

## 2013-10-06 MED ORDER — ONDANSETRON HCL 4 MG/2ML IJ SOLN
INTRAMUSCULAR | Status: AC
  Filled 2013-10-06: qty 2

## 2013-10-06 MED ORDER — PHENOL 1.4 % MT LIQD: 1.0000 | OROMUCOSAL | Status: DC | PRN

## 2013-10-06 MED ORDER — DEXTROSE 5 % IV SOLN
3.0000 g | Freq: Once | INTRAVENOUS | Status: DC
  Filled 2013-10-06: qty 3000

## 2013-10-06 MED ORDER — CHLORHEXIDINE GLUCONATE 4 % EX LIQD
60.0000 mL | Freq: Once | CUTANEOUS | Status: DC
  Filled 2013-10-06: qty 60

## 2013-10-06 MED ORDER — ONDANSETRON HCL 4 MG/2ML IJ SOLN
4.0000 mg | Freq: Four times a day (QID) | INTRAMUSCULAR | Status: DC | PRN
  Administered 2013-10-06 – 2013-10-07 (×3): 4 mg via INTRAVENOUS
  Filled 2013-10-06 (×3): qty 2

## 2013-10-06 MED ORDER — MIDAZOLAM HCL 2 MG/2ML IJ SOLN
INTRAMUSCULAR | Status: AC
  Filled 2013-10-06: qty 2

## 2013-10-06 MED ORDER — PROMETHAZINE HCL 25 MG/ML IJ SOLN
6.2500 mg | INTRAMUSCULAR | Status: DC | PRN
  Administered 2013-10-06: 6.25 mg via INTRAVENOUS

## 2013-10-06 MED ORDER — GLYCOPYRROLATE 0.2 MG/ML IJ SOLN
INTRAMUSCULAR | Status: DC | PRN
  Administered 2013-10-06: .4 mg via INTRAVENOUS

## 2013-10-06 MED ORDER — HYDROMORPHONE HCL PF 1 MG/ML IJ SOLN
INTRAMUSCULAR | Status: AC
  Filled 2013-10-06: qty 1

## 2013-10-06 MED ORDER — OXYCODONE HCL 5 MG PO TABS
5.0000 mg | ORAL_TABLET | Freq: Once | ORAL | Status: AC | PRN
  Administered 2013-10-06: 5 mg via ORAL

## 2013-10-06 MED ORDER — MENTHOL 3 MG MT LOZG: 1.0000 | LOZENGE | OROMUCOSAL | Status: DC | PRN

## 2013-10-06 MED ORDER — METOCLOPRAMIDE HCL 10 MG PO TABS
5.0000 mg | ORAL_TABLET | Freq: Three times a day (TID) | ORAL | Status: DC | PRN
  Administered 2013-10-08: 10 mg via ORAL
  Filled 2013-10-06: qty 1

## 2013-10-06 MED ORDER — DEXAMETHASONE SODIUM PHOSPHATE 10 MG/ML IJ SOLN
INTRAMUSCULAR | Status: DC | PRN
  Administered 2013-10-06: 4 mg via INTRAVENOUS
  Administered 2013-10-06: 4 mg

## 2013-10-06 MED ORDER — FENTANYL CITRATE 0.05 MG/ML IJ SOLN
INTRAMUSCULAR | Status: AC
  Filled 2013-10-06: qty 5

## 2013-10-06 MED ORDER — HYDROMORPHONE HCL PF 1 MG/ML IJ SOLN
0.2500 mg | INTRAMUSCULAR | Status: DC | PRN
  Administered 2013-10-06 (×4): 0.5 mg via INTRAVENOUS

## 2013-10-06 MED ORDER — PROMETHAZINE HCL 25 MG/ML IJ SOLN
INTRAMUSCULAR | Status: AC
  Filled 2013-10-06: qty 1

## 2013-10-06 MED ORDER — LACTATED RINGERS IV SOLN
INTRAVENOUS | Status: DC
  Administered 2013-10-06 – 2013-10-07 (×2): via INTRAVENOUS

## 2013-10-06 MED ORDER — DEXTROSE-NACL 5-0.45 % IV SOLN: INTRAVENOUS | Status: AC

## 2013-10-06 MED ORDER — SUFENTANIL CITRATE 50 MCG/ML IV SOLN
INTRAVENOUS | Status: AC
  Filled 2013-10-06: qty 1

## 2013-10-06 MED ORDER — SODIUM CHLORIDE 0.9 % IJ SOLN
INTRAMUSCULAR | Status: AC
  Filled 2013-10-06: qty 10

## 2013-10-06 MED ORDER — ACETAMINOPHEN 650 MG RE SUPP: 650.0000 mg | Freq: Four times a day (QID) | RECTAL | Status: DC | PRN

## 2013-10-06 MED ORDER — NEOSTIGMINE METHYLSULFATE 1 MG/ML IJ SOLN
INTRAMUSCULAR | Status: DC | PRN
  Administered 2013-10-06: 3 mg via INTRAVENOUS

## 2013-10-06 MED ORDER — DEXTROSE-NACL 5-0.45 % IV SOLN: 100.0000 mL/h | INTRAVENOUS | Status: DC

## 2013-10-06 MED ORDER — LIDOCAINE HCL (CARDIAC) 20 MG/ML IV SOLN
INTRAVENOUS | Status: AC
  Filled 2013-10-06: qty 5

## 2013-10-06 MED ORDER — FENTANYL CITRATE 0.05 MG/ML IJ SOLN
50.0000 ug | Freq: Once | INTRAMUSCULAR | Status: AC
  Administered 2013-10-06: 100 ug via INTRAVENOUS
  Filled 2013-10-06: qty 2

## 2013-10-06 MED ORDER — ROCURONIUM BROMIDE 100 MG/10ML IV SOLN
INTRAVENOUS | Status: DC | PRN
  Administered 2013-10-06: 50 mg via INTRAVENOUS

## 2013-10-06 MED ORDER — ONDANSETRON HCL 4 MG/2ML IJ SOLN
INTRAMUSCULAR | Status: DC | PRN
  Administered 2013-10-06 (×2): 4 mg via INTRAVENOUS

## 2013-10-06 MED ORDER — PANTOPRAZOLE SODIUM 40 MG PO TBEC
40.0000 mg | DELAYED_RELEASE_TABLET | Freq: Every day | ORAL | Status: DC
  Administered 2013-10-07 – 2013-10-09 (×3): 40 mg via ORAL
  Filled 2013-10-06 (×3): qty 1

## 2013-10-06 SURGICAL SUPPLY — 71 items
BANDAGE ELASTIC 6 VELCRO ST LF (GAUZE/BANDAGES/DRESSINGS) ×2 IMPLANT
BANDAGE ESMARK 6X9 LF (GAUZE/BANDAGES/DRESSINGS) ×1 IMPLANT
BLADE SAG 18X100X1.27 (BLADE) ×4 IMPLANT
BNDG ESMARK 6X9 LF (GAUZE/BANDAGES/DRESSINGS) ×2
BOWL SMART MIX CTS (DISPOSABLE) ×2 IMPLANT
CEMENT BONE SIMPLEX SPEEDSET (Cement) ×4 IMPLANT
CHLORAPREP W/TINT 26ML (MISCELLANEOUS) IMPLANT
CLOTH BEACON ORANGE TIMEOUT ST (SAFETY) IMPLANT
CLSR STERI-STRIP ANTIMIC 1/2X4 (GAUZE/BANDAGES/DRESSINGS) ×2 IMPLANT
COVER SURGICAL LIGHT HANDLE (MISCELLANEOUS) ×2 IMPLANT
CUFF TOURNIQUET SINGLE 34IN LL (TOURNIQUET CUFF) ×2 IMPLANT
DRAPE EXTREMITY T 121X128X90 (DRAPE) ×2 IMPLANT
DRAPE PROXIMA HALF (DRAPES) ×2 IMPLANT
DRAPE U-SHAPE 47X51 STRL (DRAPES) ×2 IMPLANT
DRSG ADAPTIC 3X8 NADH LF (GAUZE/BANDAGES/DRESSINGS) ×2 IMPLANT
DRSG PAD ABDOMINAL 8X10 ST (GAUZE/BANDAGES/DRESSINGS) ×2 IMPLANT
DURAPREP 26ML APPLICATOR (WOUND CARE) ×4 IMPLANT
ELECT CAUTERY BLADE 6.4 (BLADE) ×2 IMPLANT
ELECT REM PT RETURN 9FT ADLT (ELECTROSURGICAL) ×2
ELECTRODE REM PT RTRN 9FT ADLT (ELECTROSURGICAL) ×1 IMPLANT
EVACUATOR 1/8 PVC DRAIN (DRAIN) ×2 IMPLANT
FACESHIELD LNG OPTICON STERILE (SAFETY) ×2 IMPLANT
GLOVE BIO SURGEON STRL SZ7 (GLOVE) ×2 IMPLANT
GLOVE BIO SURGEON STRL SZ7.5 (GLOVE) ×2 IMPLANT
GLOVE BIOGEL PI IND STRL 7.0 (GLOVE) ×2 IMPLANT
GLOVE BIOGEL PI IND STRL 7.5 (GLOVE) ×2 IMPLANT
GLOVE BIOGEL PI IND STRL 8 (GLOVE) ×2 IMPLANT
GLOVE BIOGEL PI INDICATOR 7.0 (GLOVE) ×2
GLOVE BIOGEL PI INDICATOR 7.5 (GLOVE) ×2
GLOVE BIOGEL PI INDICATOR 8 (GLOVE) ×2
GLOVE ECLIPSE 6.5 STRL STRAW (GLOVE) ×4 IMPLANT
GLOVE SURG SS PI 6.5 STRL IVOR (GLOVE) ×4 IMPLANT
GOWN PREVENTION PLUS XLARGE (GOWN DISPOSABLE) IMPLANT
GOWN STRL NON-REIN LRG LVL3 (GOWN DISPOSABLE) ×4 IMPLANT
HANDPIECE INTERPULSE COAX TIP (DISPOSABLE) ×1
IMMOBILIZER KNEE 22 UNIV (SOFTGOODS) ×2 IMPLANT
IMMOBILIZER KNEE 24 THIGH 36 (MISCELLANEOUS) IMPLANT
IMMOBILIZER KNEE 24 UNIV (MISCELLANEOUS)
KIT BASIN OR (CUSTOM PROCEDURE TRAY) ×2 IMPLANT
KIT ROOM TURNOVER OR (KITS) ×2 IMPLANT
KNEE/VIT E POLY LINER LEVEL 1B ×2 IMPLANT
MANIFOLD NEPTUNE II (INSTRUMENTS) ×2 IMPLANT
NEEDLE 18GX1X1/2 (RX/OR ONLY) (NEEDLE) IMPLANT
NEEDLE HYPO 25GX1X1/2 BEV (NEEDLE) ×2 IMPLANT
NS IRRIG 1000ML POUR BTL (IV SOLUTION) ×2 IMPLANT
PACK TOTAL JOINT (CUSTOM PROCEDURE TRAY) ×2 IMPLANT
PAD ABD 8X10 STRL (GAUZE/BANDAGES/DRESSINGS) ×2 IMPLANT
PAD ARMBOARD 7.5X6 YLW CONV (MISCELLANEOUS) ×4 IMPLANT
PAD CAST 4YDX4 CTTN HI CHSV (CAST SUPPLIES) ×1 IMPLANT
PADDING CAST ABS 4INX4YD NS (CAST SUPPLIES) ×1
PADDING CAST ABS 6INX4YD NS (CAST SUPPLIES) ×1
PADDING CAST ABS COTTON 4X4 ST (CAST SUPPLIES) ×1 IMPLANT
PADDING CAST ABS COTTON 6X4 NS (CAST SUPPLIES) ×1 IMPLANT
PADDING CAST COTTON 4X4 STRL (CAST SUPPLIES) ×1
PADDING CAST COTTON 6X4 STRL (CAST SUPPLIES) ×2 IMPLANT
SET HNDPC FAN SPRY TIP SCT (DISPOSABLE) ×1 IMPLANT
SPONGE GAUZE 4X4 12PLY (GAUZE/BANDAGES/DRESSINGS) ×2 IMPLANT
STAPLER VISISTAT 35W (STAPLE) IMPLANT
STRIP CLOSURE SKIN 1/2X4 (GAUZE/BANDAGES/DRESSINGS) IMPLANT
SUCTION FRAZIER TIP 10 FR DISP (SUCTIONS) ×2 IMPLANT
SUT MNCRL AB 4-0 PS2 18 (SUTURE) ×2 IMPLANT
SUT MON AB 2-0 CT1 27 (SUTURE) ×4 IMPLANT
SUT VIC AB 1 CTX 36 (SUTURE) ×2
SUT VIC AB 1 CTX36XBRD ANBCTR (SUTURE) ×2 IMPLANT
SYR 50ML LL SCALE MARK (SYRINGE) ×2 IMPLANT
SYR CONTROL 10ML LL (SYRINGE) ×2 IMPLANT
TOWEL OR 17X24 6PK STRL BLUE (TOWEL DISPOSABLE) ×2 IMPLANT
TOWEL OR 17X26 10 PK STRL BLUE (TOWEL DISPOSABLE) ×2 IMPLANT
TOWEL OR NON WOVEN STRL DISP B (DISPOSABLE) ×2 IMPLANT
TRAY FOLEY BAG SILVER LF 14FR (CATHETERS) ×2 IMPLANT
WATER STERILE IRR 1000ML POUR (IV SOLUTION) ×4 IMPLANT

## 2013-10-06 NOTE — Progress Notes (Signed)
Orthopedic Tech Progress Note Patient Details:  [name] 05-02-1961 [id number]  CPM Left Knee CPM Left Knee: On Left Knee Flexion (Degrees): 60 Left Knee Extension (Degrees): 0 Additional Comments: Trapeze bar and foot roll   Shawnie PonsCammer, Shawntell Dixson Carol 10/06/2013, 2:54 PM

## 2013-10-06 NOTE — Interval H&P Note (Signed)
History and Physical Interval Note:  10/06/2013 7:27 AM  [name]  has presented today for surgery, with the diagnosis of OA LEFT KNEE  The various methods of treatment have been discussed with the patient and family. After consideration of risks, benefits and other options for treatment, the patient has consented to  Procedure(s): TOTAL KNEE ARTHROPLASTY (Left) as a surgical intervention .  The patient's history has been reviewed, patient examined, no change in status, stable for surgery.  I have reviewed the patient's chart and labs.  Questions were answered to the patient's satisfaction.     Eann Cleland, D

## 2013-10-06 NOTE — Anesthesia Postprocedure Evaluation (Signed)
  Anesthesia Post-op Note  Patient: Geralynn OchsBonnie L Kelso  Procedure(s) Performed: Procedure(s): TOTAL KNEE ARTHROPLASTY (Left)  Patient Location: PACU  Anesthesia Type:GA combined with regional for post-op pain  Level of Consciousness: awake  Airway and Oxygen Therapy: Patient Spontanous Breathing  Post-op Pain: mild  Post-op Assessment: Post-op Vital signs reviewed, Patient's Cardiovascular Status Stable, Respiratory Function Stable, Patent Airway, No signs of Nausea or vomiting and Pain level controlled  Post-op Vital Signs: Reviewed and stable  Complications: No apparent anesthesia complications

## 2013-10-06 NOTE — Plan of Care (Signed)
Problem: Consults Goal: Diagnosis- Total Joint Replacement Primary Total Knee Left     

## 2013-10-06 NOTE — Transfer of Care (Signed)
Immediate Anesthesia Transfer of Care Note  Patient: Anne OchsBonnie L Tomberlin  Procedure(s) Performed: Procedure(s): TOTAL KNEE ARTHROPLASTY (Left)  Patient Location: PACU  Anesthesia Type:General  Level of Consciousness: awake, sedated and patient cooperative  Airway & Oxygen Therapy: Patient Spontanous Breathing and Patient connected to face mask oxygen  Post-op Assessment: Report given to PACU RN, Post -op Vital signs reviewed and stable and Patient moving all extremities  Post vital signs: Reviewed and stable  Complications: No apparent anesthesia complications

## 2013-10-06 NOTE — Progress Notes (Signed)
Orthopedic Tech Progress Note Patient Details:  Anne OchsBonnie L [name] November 08, 1960 [id number] On cpm at 8:25 LLE 0-60 [name], female   DOB: November 08, 1960, 53 y.o.   MRN: [id number]   [name], [name] 10/06/2013, 8:24 PM

## 2013-10-06 NOTE — Anesthesia Preprocedure Evaluation (Addendum)
Anesthesia Evaluation  Patient identified by MRN, date of birth, ID band Patient awake    Reviewed: Allergy & Precautions, H&P , NPO status , Patient's Chart, lab work & pertinent test results  History of Anesthesia Complications (+) PONV  Airway Mallampati: II TM Distance: <3 FB Neck ROM: Full    Dental   Pulmonary sleep apnea ,  breath sounds clear to auscultation        Cardiovascular hypertension, Rhythm:Regular Rate:Normal     Neuro/Psych  Neuromuscular disease    GI/Hepatic GERD-  ,  Endo/Other  Morbid obesity  Renal/GU      Musculoskeletal   Abdominal (+) + obese,   Peds  Hematology   Anesthesia Other Findings   Reproductive/Obstetrics                         Anesthesia Physical Anesthesia Plan  ASA: III  Anesthesia Plan: General   Post-op Pain Management:    Induction: Intravenous  Airway Management Planned: Oral ETT  Additional Equipment:   Intra-op Plan:   Post-operative Plan: Extubation in OR  Informed Consent: I have reviewed the patients History and Physical, chart, labs and discussed the procedure including the risks, benefits and alternatives for the proposed anesthesia with the patient or authorized representative who has indicated his/her understanding and acceptance.     Plan Discussed with: CRNA and Surgeon  Anesthesia Plan Comments:         Anesthesia Quick Evaluation

## 2013-10-06 NOTE — Anesthesia Procedure Notes (Signed)
Anesthesia Regional Block:  Femoral nerve block  Pre-Anesthetic Checklist: ,, timeout performed, Correct Patient, Correct Site, Correct Laterality, Correct Procedure, Correct Position, site marked, Risks and benefits discussed,  Surgical consent,  Pre-op evaluation,  At surgeon's request and post-op pain management  Laterality: Left  Prep: chloraprep       Needles:   Needle Type: Echogenic Stimulator Needle     Needle Length: 9cm 9 cm Needle Gauge: 22 and 22 G    Additional Needles:  Procedures: nerve stimulator Femoral nerve block  Nerve Stimulator or Paresthesia:  Response: 0.5 mA,   Additional Responses:   Narrative:  Start time: 10/06/2013 8:37 AM End time: 10/06/2013 8:47 AM Injection made incrementally with aspirations every 5 mL. Anesthesiologist: Dr Gypsy BalsamKasik  Additional Notes: 8295-6213: 0837-0847 L FNB POP CHG prep, sterile tech #22 stim/echo needle with stim down to .5ma Multiple neg asp Vernia BuffMarc .5%w/epi 1:200000 total 25cc+decadron 4mg  infiltrated No compl Dr Gypsy BalsamKasik

## 2013-10-06 NOTE — Preoperative (Signed)
Beta Blockers   Reason not to administer Beta Blockers:Not Applicable 

## 2013-10-06 NOTE — Op Note (Signed)
DATE OF SURGERY:  10/06/2013 TIME: 11:22 AM  PATIENT NAME:  [name]   AGE: 53 y.o.    PRE-OPERATIVE DIAGNOSIS:   OA LEFT KNEE  POST-OPERATIVE DIAGNOSIS:  Same  PROCEDURE:  Procedure(s): TOTAL KNEE ARTHROPLASTY   SURGEON:  Margarita RanaMURPHY, Cordarius Benning, D, MD   ASSISTANT:  Janace LittenBrandon Parry OPA   OPERATIVE IMPLANTS: [employer]ker Triathlon Posterior Stabilized.  Femur size 4, Tibia size 5, Patella size 35 3-peg oval button, with a 9 mm polyethylene insert.   PREOPERATIVE INDICATIONS:  [name] is a 53 y.o. year old female with end stage bone on bone degenerative arthritis of the knee who failed conservative treatment, including injections, antiinflammatories, activity modification, and assistive devices, and had significant impairment of their activities of daily living, and elected for Total Knee Arthroplasty.   The risks, benefits, and alternatives were discussed at length including but not limited to the risks of infection, bleeding, nerve injury, stiffness, blood clots, the need for revision surgery, cardiopulmonary complications, among others, and they were willing to proceed.   OPERATIVE DESCRIPTION:  The patient was brought to the operative room and placed in a supine position.  General anesthesia was administered.  IV antibiotics were given.  The lower extremity was prepped and draped in the usual sterile fashion.  Time out was performed.  The leg was elevated and exsanguinated and the tourniquet was inflated.  Anterior approach was performed.  The patella was everted and osteophytes were removed.  The anterior horn of the medial and lateral meniscus was removed.   The distal femur was opened with the drill and the intramedullary distal femoral cutting jig was utilized, set at 5 degrees resecting 8 mm off the distal femur.  Care was taken to protect the collateral ligaments.  The distal femoral sizing jig was applied, taking care to avoid notching.  Then the 4-in-1 cutting jig  was applied and the anterior and posterior femur was cut, along with the chamfer cuts.  All posterior osteophytes were removed.  The flexion gap was then measured and was symmetric with the extension gap.  Then the extramedullary tibial cutting jig was utilized making the appropriate cut using the anterior tibial crest as a reference building in appropriate posterior slope.  Care was taken during the cut to protect the medial and collateral ligaments.  The proximal tibia was removed along with the posterior horns of the menisci.  The PCL was sacrificed.    The extensor gap was measured and was approximately 9mm.    I completed the distal femoral preparation using the appropriate jig to prepare the box.  The patella was then measured, and cut with the saw.    The proximal tibia sized and prepared accordingly with the reamer and the punch, and then all components were trialed with the 9mm poly insert.  The knee was found to have excellent balance and full motion.    The above named components were then cemented into place and all excess cement was removed.  The real polyethylene implant was placed.  Exparel was injected in the soft tissue  The knee was easily taken through a range of motion and the patella tracked well and the knee irrigated copiously and the parapatellar and subcutaneous tissue closed with vicryl, and monocryl with steri strips for the skin.  The wounds were injected with Exparel, and dressed with sterile gauze and the tourniquet released and the patient was awakened and returned to the PACU in stable and satisfactory condition.  There  were no complications.  Total tourniquet time was 75 minutes.   POSTOPERATIVE PLAN: post op Abx, DVT px: ASA 325 daily scd's and TED hose

## 2013-10-07 ENCOUNTER — Encounter (HOSPITAL_COMMUNITY): Payer: Self-pay | Admitting: General Practice

## 2013-10-07 LAB — CBC
HCT: 33 % — ABNORMAL LOW (ref 36.0–46.0)
HEMOGLOBIN: 11.3 g/dL — AB (ref 12.0–15.0)
MCH: 31.7 pg (ref 26.0–34.0)
MCHC: 34.2 g/dL (ref 30.0–36.0)
MCV: 92.4 fL (ref 78.0–100.0)
Platelets: 181 10*3/uL (ref 150–400)
RBC: 3.57 MIL/uL — ABNORMAL LOW (ref 3.87–5.11)
RDW: 13.4 % (ref 11.5–15.5)
WBC: 11.8 10*3/uL — ABNORMAL HIGH (ref 4.0–10.5)

## 2013-10-07 MED ORDER — DOCUSATE SODIUM 100 MG PO CAPS: 100.0000 mg | ORAL_CAPSULE | Freq: Two times a day (BID) | ORAL | Status: DC

## 2013-10-07 MED ORDER — OXYCODONE HCL 5 MG PO TABS
10.0000 mg | ORAL_TABLET | ORAL | Status: DC | PRN
  Administered 2013-10-07 – 2013-10-09 (×12): 10 mg via ORAL
  Filled 2013-10-07 (×12): qty 2

## 2013-10-07 MED ORDER — OXYCODONE HCL 5 MG PO TABS: 5.0000 mg | ORAL_TABLET | ORAL | Status: DC | PRN

## 2013-10-07 MED ORDER — ONDANSETRON HCL 4 MG PO TABS: 4.0000 mg | ORAL_TABLET | Freq: Three times a day (TID) | ORAL | Status: DC | PRN

## 2013-10-07 MED ORDER — MEPERIDINE HCL 50 MG PO TABS: 50.0000 mg | ORAL_TABLET | ORAL | Status: DC | PRN

## 2013-10-07 MED ORDER — ASPIRIN EC 325 MG PO TBEC: 325.0000 mg | DELAYED_RELEASE_TABLET | Freq: Every day | ORAL | Status: DC

## 2013-10-07 MED ORDER — OXYCODONE HCL 5 MG PO TABS: 10.0000 mg | ORAL_TABLET | ORAL | Status: DC | PRN

## 2013-10-07 MED ORDER — METHOCARBAMOL 500 MG PO TABS
500.0000 mg | ORAL_TABLET | Freq: Four times a day (QID) | ORAL | Status: DC | PRN
  Administered 2013-10-07 – 2013-10-08 (×6): 500 mg via ORAL
  Filled 2013-10-07 (×6): qty 1

## 2013-10-07 NOTE — Evaluation (Signed)
Physical Therapy Evaluation Patient Details Name: ELIANE HAMMERSMITH MRN: [id number] DOB: 1960-10-25 Today's Date: 10/07/2013 Time: 7829-5621 PT Time Calculation (min): 35 min  PT Assessment / Plan / Recommendation History of Present Illness  Pt is a 53 y/o female admitted s/p L TKA.  Clinical Impression  This patient presents with acute pain and decreased functional independence following the above mentioned procedure. At the time of PT eval, pt was in increased pain and was limited in therapeutic exercise and mobility because of this. This patient is appropriate for skilled PT interventions to address functional limitations, improve safety and independence with functional mobility, and return to PLOF.    PT Assessment  Patient needs continued PT services    Follow Up Recommendations  Home health PT    Does the patient have the potential to tolerate intense rehabilitation      Barriers to Discharge        Equipment Recommendations  Rolling walker with 5" wheels;3in1 (PT)    Recommendations for Other Services     Frequency 7X/week    Precautions / Restrictions Precautions Precautions: Fall;Knee Precaution Comments: Discussed towel roll under heel and NO pillow under knee.  Required Braces or Orthoses: Knee Immobilizer - Left Knee Immobilizer - Left: On when out of bed or walking Restrictions Weight Bearing Restrictions: Yes LLE Weight Bearing: Weight bearing as tolerated   Pertinent Vitals/Pain 10/10 - pt received IV pain meds from RN prior to beginning mobility      Mobility  Bed Mobility Overal bed mobility: Needs Assistance Bed Mobility: Supine to Sit Supine to sit: Min assist General bed mobility comments: VC's for sequencing and technique. Assist for movement and support of LLE to EOB.  Transfers Overall transfer level: Needs assistance Equipment used: Rolling walker (2 wheeled) Transfers: Sit to/from Stand Sit to Stand: Min assist;+2 physical  assistance General transfer comment: VC's for hand placement on seated surface for safety. Assist to come to full stand.  Ambulation/Gait Ambulation/Gait assistance: Min assist Ambulation Distance (Feet): 12 Feet Assistive device: Rolling walker (2 wheeled) Gait Pattern/deviations: Step-to pattern;Decreased stride length Gait velocity: Decreased Gait velocity interpretation: Below normal speed for age/gender General Gait Details: VC's for sequencing and safety awareness with the walker. Assist for occasional walker movement and positioning.     Exercises Total Joint Exercises Ankle Circles/Pumps: 10 reps Quad Sets: 10 reps   PT Diagnosis: Difficulty walking;Acute pain  PT Problem List: Decreased strength;Decreased range of motion;Decreased activity tolerance;Decreased balance;Decreased mobility;Decreased knowledge of use of DME;Decreased safety awareness;Decreased knowledge of precautions;Pain PT Treatment Interventions: DME instruction;Gait training;Stair training;Functional mobility training;Therapeutic activities;Therapeutic exercise;Neuromuscular re-education;Patient/family education     PT Goals(Current goals can be found in the care plan section) Acute Rehab PT Goals Patient Stated Goal: To return home with husband PT Goal Formulation: With patient Time For Goal Achievement: 10/14/13 Potential to Achieve Goals: Good  Visit Information  Last PT Received On: 10/07/13 Assistance Needed: +2 (Chair follow) History of Present Illness: Pt is a 53 y/o female admitted s/p L TKA.       Prior Functioning  Home Living Family/patient expects to be discharged to:: Private residence Living Arrangements: Spouse/significant other Available Help at Discharge: Family;Available 24 hours/day Type of Home: House Home Access: Stairs to enter [employer] of Steps: 2 Entrance Stairs-Rails: None Home Layout: One level Home Equipment: None Prior Function Level of Independence:  Independent Comments: Driving and working Communication Communication: No difficulties Dominant Hand: Right    Cognition  Cognition Arousal/Alertness: Awake/alert Behavior During  Therapy: WFL for tasks assessed/performed Overall Cognitive Status: Within Functional Limits for tasks assessed    Extremity/Trunk Assessment Upper Extremity Assessment Upper Extremity Assessment: Defer to OT evaluation Lower Extremity Assessment Lower Extremity Assessment: LLE deficits/detail LLE Deficits / Details: Decreased strength and AROM consistent with TKA LLE: Unable to fully assess due to pain Cervical / Trunk Assessment Cervical / Trunk Assessment: Normal   Balance Balance Overall balance assessment: Needs assistance Sitting-balance support: Feet supported;Bilateral upper extremity supported Sitting balance-Leahy Scale: Fair Standing balance support: Bilateral upper extremity supported Standing balance-Leahy Scale: Poor  End of Session PT - End of Session Equipment Utilized During Treatment: Gait belt;Left knee immobilizer Activity Tolerance: Patient tolerated treatment well Patient left: in chair;with call bell/phone within reach Nurse Communication: Mobility status CPM Left Knee CPM Left Knee: Off  GP     Ruthann CancerHamilton, Liboria Putnam 10/07/2013, 1:50 PM  Ruthann CancerLaura Hamilton, PT, DPT [phone]

## 2013-10-07 NOTE — Care Management Note (Signed)
CARE MANAGEMENT NOTE 10/07/2013  Patient:  [name],[name]   Account Number:  [id number]  Date Initiated:  10/07/2013  Documentation initiated by:  Theo Reither  Subjective/Objective Assessment:   53 yr old female s/p left total hip arthroplasty.     Action/Plan:   Case manager spoke with patient cooncerning home health and DME needs at discharge. Choice offered.Patient will have family assistance at discharge. Preoperativelly setup with [facility], no changes..   Anticipated DC Date:  10/08/2013   Anticipated DC Plan:  HOME W HOME HEALTH SERVICES      DC Planning Services  CM consult      PAC Choice  HOME HEALTH  DURABLE MEDICAL EQUIPMENT   Choice offered to / List presented to:  C-1 Patient   DME arranged  3-N-1  WALKER - WIDE      DME agency  [facility]     HH arranged  HH-2 PT      HH agency  [facility]   Status of service:  Completed, signed off Medicare Important Message given?   (If response is "NO", the following Medicare IM given date fields will be blank) Date Medicare IM given:   Date Additional Medicare IM given:    Discharge Disposition:  HOME W HOME HEALTH SERVICES  

## 2013-10-07 NOTE — Progress Notes (Signed)
MD order for 50mg  PO demerol was not approved by pharmacy due to their restrictions on its use. Nursing continued with previous pain med orders. After dressing changed and IV morphine given patient is comfortable in bed and asleep.

## 2013-10-07 NOTE — Progress Notes (Signed)
Utilization review completed.  

## 2013-10-07 NOTE — Progress Notes (Signed)
[name], female   DOB: [birth year], 53 y.o.   MRN:[name] [id number]     Subjective:  Patient reports pain as mild to moderate.  Patient sitting up in the chair and eating breakfast.  Objective:   VITALS:   Filed Vitals:   10/06/13 1718 10/06/13 1720 10/06/13 2057 10/07/13 0608  BP:   130/78 152/59  Pulse: 99  103 105  Temp:  98.8 F (37.1 C) 98.7 F (37.1 C) 99.9 F (37.7 C)  TempSrc:   Oral Oral  Resp: 21  20 18   SpO2: 98%  99% 98%    ABD soft Sensation intact distally Dorsiflexion/Plantar flexion intact Incision: dressing C/D/I and no drainage   Lab Results  Component Value Date   WBC 11.8* 10/07/2013   HGB 11.3* 10/07/2013   HCT 33.0* 10/07/2013   MCV 92.4 10/07/2013   PLT 181 10/07/2013     Assessment/Plan: 1 Day Post-Op   Active Problems:   Arthritis of knee   [facility]vance diet Up with therapy WBAT Dry dressing PRN May plan for DC tomorrow or Friday   Haskel KhanDOUGLAS Debora Stockdale 10/07/2013, 7:51 AM   Margarita Ranaimothy Murphy MD [phone]

## 2013-10-07 NOTE — Progress Notes (Signed)
Physical Therapy Treatment Patient Details Name: [name] MRN: [id number] DOB: Jul 08, 1961 Today's Date: 10/07/2013 Time: 7829-5621 PT Time Calculation (min): 28 min  PT Assessment / Plan / Recommendation  History of Present Illness Pt is a 53 y/o female admitted s/p L TKA.   PT Comments   Pt insisting on increased bed height and elevation of HOB to assist with transfers, despite fact that she will not have these options available at home. If functional mobility does not improve next session, may want to consider short term rehab instead of home at d/c.  Follow Up Recommendations  Home health PT     Does the patient have the potential to tolerate intense rehabilitation     Barriers to Discharge        Equipment Recommendations  Rolling walker with 5" wheels;3in1 (PT)    Recommendations for Other Services    Frequency 7X/week   Progress towards PT Goals Progress towards PT goals: Progressing toward goals  Plan Current plan remains appropriate    Precautions / Restrictions Precautions Precautions: Fall;Knee Precaution Comments: Discussed towel roll under heel and NO pillow under knee.  Required Braces or Orthoses: Knee Immobilizer - Left Knee Immobilizer - Left: On when out of bed or walking Restrictions Weight Bearing Restrictions: Yes LLE Weight Bearing: Weight bearing as tolerated   Pertinent Vitals/Pain Pt reports 7/10 pain at rest. Ice applied at end of session.     Mobility  Bed Mobility Overal bed mobility: Needs Assistance Bed Mobility: Supine to Sit;Sit to Supine Supine to sit: Min assist Sit to supine: Min assist General bed mobility comments: VC's for sequencing and technique. Assist for movement and support of LLE to/from EOB.  Transfers Overall transfer level: Needs assistance Equipment used: Rolling walker (2 wheeled) Transfers: Sit to/from [employer] Sit to Stand: Min assist;+2 physical assistance Stand pivot transfers: Min  guard General transfer comment: VC's for hand placement on seated surface for safety. Assist to come to full stand.  Ambulation/Gait Ambulation/Gait assistance: Min guard Ambulation Distance (Feet): 30 Feet Assistive device: Rolling walker (2 wheeled) Gait Pattern/deviations: Step-to pattern;Decreased stride length Gait velocity: Decreased Gait velocity interpretation: Below normal speed for age/gender General Gait Details: VC's for sequencing and safety awareness with the walker. Cues specifically for increased heel strike and increased quad activation with WB.     Exercises     PT Diagnosis:    PT Problem List:   PT Treatment Interventions:     PT Goals (current goals can now be found in the care plan section) Acute Rehab PT Goals Patient Stated Goal: To return home with husband PT Goal Formulation: With patient Time For Goal Achievement: 10/14/13 Potential to Achieve Goals: Good  Visit Information  Last PT Received On: 10/07/13 Assistance Needed: +2 (Chair follow) History of Present Illness: Pt is a 53 y/o female admitted s/p L TKA.    Subjective Data  Subjective: "I need to use the bathroom." Patient Stated Goal: To return home with husband   Cognition  Cognition Arousal/Alertness: Awake/alert Behavior During Therapy: WFL for tasks assessed/performed Overall Cognitive Status: Within Functional Limits for tasks assessed    Balance  Balance Overall balance assessment: Needs assistance Sitting-balance support: Feet supported;Bilateral upper extremity supported Sitting balance-Leahy Scale: Fair Standing balance support: Bilateral upper extremity supported Standing balance-Leahy Scale: Poor  End of Session PT - End of Session Equipment Utilized During Treatment: Gait belt;Left knee immobilizer Activity Tolerance: Patient tolerated treatment well Patient left: in chair;with call bell/phone within  reach Nurse Communication: Mobility status   GP     [name],  [name] 10/07/2013, 4:48 PM  [name], PT, DPT [phone]

## 2013-10-07 NOTE — Evaluation (Signed)
Occupational Therapy Evaluation Patient Details Name: [name] MRN: [id number] DOB: February 07, 1961 Today's Date: 10/07/2013 Time: 1324-4010 OT Time Calculation (min): 28 min  OT Assessment / Plan / Recommendation History of present illness Pt is a 53 y/o female admitted s/p L TKA.   Clinical Impression   Pt presents with below problem list. Pt limited by pain. Feel pt will benefit from acute OT to increase independence prior to d/c home.     OT Assessment  Patient needs continued OT Services    Follow Up Recommendations  No OT follow up;Supervision - Intermittent (when OOB/mobility)    Barriers to Discharge      Equipment Recommendations  None recommended by OT    Recommendations for Other Services    Frequency  Min 2X/week    Precautions / Restrictions Precautions Precautions: Fall;Knee Precaution Comments: Reviewed precautions with pt Required Braces or Orthoses: Knee Immobilizer - Left Knee Immobilizer - Left: On when out of bed or walking Restrictions Weight Bearing Restrictions: Yes LLE Weight Bearing: Weight bearing as tolerated   Pertinent Vitals/Pain 8/10 pain. Ice applied.     ADL  Upper Body Dressing: Set up Where Assessed - Upper Body Dressing: Supported sitting Lower Body Dressing: Maximal assistance Where Assessed - Lower Body Dressing: Supported sit to Pharmacist, hospital: Minimal assistance Statistician Method: Sit to Barista:  (from bed and chair) Tub/Shower Transfer Method: Not assessed Equipment Used: Gait belt;Knee Immobilizer;Long-handled shoe horn;Long-handled sponge;Reacher;Rolling walker;Sock aid Transfers/Ambulation Related to ADLs: Min guard/Min A ADL Comments: Educated on benefit of reaching to don/doff sock/shoe on left foot as it increases ROM in knee (pt not able to tolerate much knee flexion). Educated on AE for LB ADLs and showed pt AE. Pt states someone will help her. Educated on dressing technique. Pt  planning to sponge bathe for a while, but OT also educated on tub transfer techniques it pt decides to get in tub.     OT Diagnosis: Acute pain  OT Problem List: Decreased strength;Decreased range of motion;Decreased activity tolerance;Decreased knowledge of use of DME or AE;Decreased knowledge of precautions;Pain OT Treatment Interventions: Self-care/ADL training;DME and/or AE instruction;Therapeutic activities;Patient/family education;Balance training   OT Goals(Current goals can be found in the care plan section) Acute Rehab OT Goals Patient Stated Goal: not stated OT Goal Formulation: With patient Time For Goal Achievement: 10/14/13 Potential to Achieve Goals: Good ADL Goals Pt Will Transfer to Toilet: with modified independence;ambulating (3 in 1 over commode) Pt Will Perform Toileting - Clothing Manipulation and hygiene: with modified independence;sit to/from stand  Visit Information  Last OT Received On: 10/07/13 Assistance Needed: +2 (chair to follow) History of Present Illness: Pt is a 53 y/o female admitted s/p L TKA.       Prior Functioning     Home Living Family/patient expects to be discharged to:: Private residence Living Arrangements: Non-relatives/Friends Available Help at Discharge: Family;Available 24 hours/day Type of Home: House Home Access: Stairs to enter [employer] of Steps: 2 Entrance Stairs-Rails: None Home Layout: One level Home Equipment: Bedside commode;Shower seat Prior Function Level of Independence: Independent Comments: Driving and working Communication Communication: No difficulties Dominant Hand: Right         Vision/Perception     Cognition  Cognition Arousal/Alertness: Awake/alert Behavior During Therapy: WFL for tasks assessed/performed Overall Cognitive Status: Within Functional Limits for tasks assessed    Extremity/Trunk Assessment Upper Extremity Assessment Upper Extremity Assessment: Overall WFL for tasks  assessed Lower Extremity Assessment Lower Extremity Assessment:  Defer to PT evaluation     Mobility Bed Mobility Overal bed mobility: Needs Assistance Bed Mobility: Supine to Sit;Sit to Supine Supine to sit: Min assist Sit to supine: Min assist General bed mobility comments: Min A for LLE. Assisted in positioning sheet around pt's left foot for her to help move LLE. Transfers Overall transfer level: Needs assistance Equipment used: Rolling walker (2 wheeled) Transfers: Sit to/from Stand Sit to Stand: Min assist Stand pivot transfers: Min guard General transfer comment: Cues for technique. Min guard for stand to sit transfer.     Exercise        End of Session OT - End of Session Equipment Utilized During Treatment: Gait belt;Rolling walker;Left knee immobilizer Activity Tolerance: Patient limited by pain Patient left: in bed;with call bell/phone within reach;with bed alarm set CPM Left Knee CPM Left Knee: Off  GO     Earlie RavelingStraub, Tenessa Marsee L OTR/L [phone] 10/07/2013, 6:16 PM

## 2013-10-08 MED ORDER — PROMETHAZINE HCL 25 MG PO TABS: 25.0000 mg | ORAL_TABLET | Freq: Four times a day (QID) | ORAL | Status: DC | PRN

## 2013-10-08 NOTE — Progress Notes (Signed)
I participated in the care of this patient and agree with the above history, physical and evaluation. I performed a review of the history and a physical exam as detailed   Bethanee Redondo Daniel Kaled Allende MD  

## 2013-10-08 NOTE — Discharge Instructions (Signed)
Home Health physical therapy [facility] pr[facility]   Take ASA 325 daily for 30 days  Elevate foot with foam pad  Keep your incision covered, clean and dry

## 2013-10-08 NOTE — Progress Notes (Deleted)
Subjective: 2 Days Post-Op Procedure(s) (LRB): TOTAL KNEE ARTHROPLASTY (Left) Patient reports pain as 2 on 0-10 scale.  Patient has had a moderate amount of bloody drainage post-op.  No nausea/vomiting, lightheadedness/dizziness.  No flatus and no bm as of yet.  Tolerating diet.    Objective: Vital signs in last 24 hours: Temp:  [98.9 F (37.2 C)-99.7 F (37.6 C)] 98.9 F (37.2 C) (02/19 11910633) Pulse Rate:  [101-107] 102 (02/19 0633) Resp:  [18] 18 (02/19 0633) BP: (120-156)/(48-75) 156/75 mmHg (02/19 0633) SpO2:  [97 %-100 %] 100 % (02/19 47820633)  Intake/Output from previous day: 02/18 0701 - 02/19 0700 In: 1600 [P.O.:1200; I.V.:400] Out: -  Intake/Output this shift: Total I/O In: 360 [P.O.:240; I.V.:120] Out: -    Recent Labs  10/07/13 0550  HGB 11.3*    Recent Labs  10/07/13 0550  WBC 11.8*  RBC 3.57*  HCT 33.0*  PLT 181   No results found for this basename: NA, K, CL, CO2, BUN, CREATININE, GLUCOSE, CALCIUM,  in the last 72 hours No results found for this basename: LABPT, INR,  in the last 72 hours  Neurologically intact ABD soft Neurovascular intact Sensation intact distally Intact pulses distally Dorsiflexion/Plantar flexion intact Incision: moderate drainage No cellulitis present Compartment soft Dressing changed by me today hemovac drain pulled by me today  Assessment/Plan: 2 Days Post-Op Procedure(s) (LRB): TOTAL KNEE ARTHROPLASTY (Left) Advance diet Up with therapy D/C IV fluids Plan for discharge tomorrow Discharge home with home health Please place CPM machine on left leg Ok to give decadron   [name], [name] 10/08/2013, 6:57 AM

## 2013-10-08 NOTE — Progress Notes (Signed)
Physical Therapy Treatment Patient Details Name: [name] MRN: [id number] DOB: 1960-09-07 Today's Date: 10/08/2013 Time: 1435-1500 PT Time Calculation (min): 25 min  PT Assessment / Plan / Recommendation  History of Present Illness Pt is a 53 y/o female admitted s/p L TKA.   PT Comments   Pt slowly progressing towards physical therapy goals. Was able to negotiate one curb step to simulate home entrance, and did so without physical assistance (close guarding necessary). Pt states she and husband talked and would rather return home than to SNF at d/c.   Follow Up Recommendations  Home health PT     Does the patient have the potential to tolerate intense rehabilitation     Barriers to Discharge        Equipment Recommendations  Rolling walker with 5" wheels;3in1 (PT)    Recommendations for Other Services    Frequency 7X/week   Progress towards PT Goals Progress towards PT goals: Progressing toward goals  Plan Current plan remains appropriate    Precautions / Restrictions Precautions Precautions: Fall;Knee Precaution Comments: Discussed towel roll under heel and NO pillow under knee Required Braces or Orthoses: Knee Immobilizer - Left Knee Immobilizer - Left: On when out of bed or walking Restrictions Weight Bearing Restrictions: Yes LLE Weight Bearing: Weight bearing as tolerated   Pertinent Vitals/Pain 7/10 at rest after ambulation.     Mobility  Bed Mobility Overal bed mobility: Needs Assistance Bed Mobility: Supine to Sit Supine to sit: HOB elevated;Min assist (for LLE) General bed mobility comments: Pt received sitting in recliner. Transfers Overall transfer level: Needs assistance Equipment used: Rolling walker (2 wheeled) Transfers: Sit to/from Stand Sit to Stand: Min guard General transfer comment: Increased time required especially for stand>sit transfer. Pt demonstrates proper hand placement and safety awareness.  Ambulation/Gait Ambulation/Gait  assistance: Min guard Ambulation Distance (Feet): 30 Feet Assistive device: Rolling walker (2 wheeled) Gait Pattern/deviations: Step-to pattern;Decreased stride length Gait velocity: Decreased Gait velocity interpretation: Below normal speed for age/gender General Gait Details: Chair follow required as pt fatigues quickly. Stairs: Yes Stairs assistance: Min guard Stair Management: Backwards;With walker Number of Stairs: 1 General stair comments: VC's for sequencing and safety awareness. Specific cues to step close to the stair prior to initiating transfer.     Exercises     PT Diagnosis:    PT Problem List:   PT Treatment Interventions:     PT Goals (current goals can now be found in the care plan section) Acute Rehab PT Goals Patient Stated Goal: To get home with husband PT Goal Formulation: With patient Time For Goal Achievement: 10/14/13 Potential to Achieve Goals: Good  Visit Information  Last PT Received On: 10/08/13 Assistance Needed: +1 History of Present Illness: Pt is a 53 y/o female admitted s/p L TKA.    Subjective Data  Subjective: "I'm tired this afternoon." Patient Stated Goal: To get home with husband   Cognition  Cognition Arousal/Alertness: Awake/alert Behavior During Therapy: WFL for tasks assessed/performed Overall Cognitive Status: Within Functional Limits for tasks assessed    Balance  Balance Overall balance assessment: Needs assistance Sitting-balance support: Feet supported;Bilateral upper extremity supported Sitting balance-Leahy Scale: Fair Standing balance support: Bilateral upper extremity supported Standing balance-Leahy Scale: Poor  End of Session PT - End of Session Equipment Utilized During Treatment: Gait belt;Left knee immobilizer Activity Tolerance: Patient limited by pain Patient left: in chair;with call bell/phone within reach Nurse Communication: Mobility status   GP     Ruthann Cancer 10/08/2013, 4:15  PM  Ruthann CancerLaura  Hamilton, PT, DPT [phone]

## 2013-10-08 NOTE — Progress Notes (Signed)
Occupational Therapy Treatment and Discharge Patient Details Name: Anne OchsBonnie L Peltzer MRN: [id number] DOB: 1960/11/03 Today's Date: 10/08/2013 Time: [id number] OT Time Calculation (min): 32 min  OT Assessment / Plan / Recommendation  History of present illness Pt is a 53 y/o female admitted s/p L TKA.   OT comments  This pt making slow progress; however she will have 24/7 A/S at home. No further OT needs, we will sign off.  Follow Up Recommendations  No OT follow up;Supervision - Intermittent       Equipment Recommendations  None recommended by OT       Frequency Min 2X/week   Progress towards OT Goals Progress towards OT goals: Progressing toward goals (but slowly)  Plan Discharge plan remains appropriate    Precautions / Restrictions Precautions Precautions: Fall;Knee Precaution Comments: Discussed towel roll under heel and NO pillow under knee Required Braces or Orthoses: Knee Immobilizer - Left Knee Immobilizer - Left: On when out of bed or walking Restrictions Weight Bearing Restrictions: Yes LLE Weight Bearing: Weight bearing as tolerated       ADL  Toilet Transfer: Minimal assistance Toilet Transfer Method: Sit to stand Toilet Transfer Equipment: Raised toilet seat with arms (or 3-in-1 over toilet) Toileting - Clothing Manipulation and Hygiene: Min guard Where Assessed - Toileting Clothing Manipulation and Hygiene: Sit to stand from 3-in-1 or toilet;Standing Equipment Used: Knee Immobilizer;Rolling walker Transfers/Ambulation Related to ADLs: min guard A with RW for all ADL Comments: Pt still reports that family will A her with LB ADLs and she will not get in tub to bath for a while      OT Goals(current goals can now be found in the care plan section) Acute Rehab OT Goals Patient Stated Goal: not stated  Visit Information  Last OT Received On: 10/08/13 Assistance Needed: +1 History of Present Illness: Pt is a 53 y/o female admitted s/p L TKA.           Cognition  Cognition Arousal/Alertness: Awake/alert Behavior During Therapy: WFL for tasks assessed/performed Overall Cognitive Status: Within Functional Limits for tasks assessed    Mobility  Bed Mobility Overal bed mobility: Needs Assistance Bed Mobility: Supine to Sit Supine to sit: HOB elevated;Min assist (for LLE) Transfers Overall transfer level: Needs assistance Equipment used: Rolling walker (2 wheeled) Transfers: Sit to/from Stand Sit to Stand: Min guard;From elevated surface General transfer comment: VC's for hand placement on seated surface for safety.           End of Session OT - End of Session Equipment Utilized During Treatment: Rolling walker;Left knee immobilizer Activity Tolerance: Patient tolerated treatment well Patient left: in chair (with PT coming in for their 2nd session)       Evette GeorgesLeonard, Haydan Wedig [phone] 10/08/2013, 3:43 PM

## 2013-10-08 NOTE — Discharge Summary (Signed)
Physician Discharge Summary  Patient ID: [name] MRN: [id number] DOB/AGE: [birth year] 53 y.o.  Admit date: 10/06/2013 Discharge date: 10/08/2013  Admission Diagnoses:  <principal problem not specified>  Discharge Diagnoses:  Active Problems:   Arthritis of knee   Past Medical History  Diagnosis Date  . PONV (postoperative nausea and vomiting)     with pain med  . Hypertension   . Sleep apnea     previously no longer neg sleep study  . GERD (gastroesophageal reflux disease)   . Umbilical hernia   . Arthritis   . UTI (lower urinary tract infection)     recent- unable to finish med only took 2 days  . 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 Resolved with thoracic epidural steroid injection.     Surgeries: Procedure(s): TOTAL KNEE ARTHROPLASTY on 10/06/2013   Consultants (if any):    Discharged Condition: Improved  Hospital Course: [name] is an 53 y.o. female who was admitted 10/06/2013 with a diagnosis of <principal problem not specified> and went to the operating room on 10/06/2013 and underwent the above named procedures.    She was given perioperative antibiotics:  Anti-infectives   Start     Dose/Rate Route Frequency Ordered Stop   10/06/13 1600  ceFAZolin (ANCEF) 3 g in dextrose 5 % 50 mL IVPB  Status:  Discontinued     3 g 160 mL/hr over 30 Minutes Intravenous  Once 10/06/13 1545 10/06/13 1742   10/06/13 1545  ceFAZolin (ANCEF) 3 g in dextrose 5 % 50 mL IVPB     3 g 160 mL/hr over 30 Minutes Intravenous Every 6 hours 10/06/13 1542 10/06/13 2253   10/06/13 0600  ceFAZolin (ANCEF) 3 g in dextrose 5 % 50 mL IVPB     3 g 160 mL/hr over 30 Minutes Intravenous On call to O.R. 10/05/13 1400 10/06/13 1000    .  She was given sequential compression devices, early ambulation, and ASA 325 for DVT prophylaxis.  She benefited maximally from the hospital stay and there were no complications.    Recent vital signs:  Filed  Vitals:   10/08/13 0633  BP: 156/75  Pulse: 102  Temp: 98.9 F (37.2 C)  Resp: 18    Recent laboratory studies:  Lab Results  Component Value Date   HGB 11.3* 10/07/2013   HGB 13.5 09/28/2013   HGB 13.4 09/07/2013   Lab Results  Component Value Date   WBC 11.8* 10/07/2013   PLT 181 10/07/2013   Lab Results  Component Value Date   INR 1.08 09/28/2013   Lab Results  Component Value Date   NA 144 09/28/2013   K 4.0 09/28/2013   CL 107 09/28/2013   CO2 24 09/28/2013   BUN 12 09/28/2013   CREATININE 0.82 09/28/2013   GLUCOSE 96 09/28/2013    Discharge Medications:     Medication List    STOP taking these medications       celecoxib 200 MG capsule  Commonly known as:  CELEBREX      TAKE these medications       aspirin EC 325 MG tablet  Take 1 tablet (325 mg total) by mouth daily.     ciprofloxacin 500 MG tablet  Commonly known as:  CIPRO  Take 1 tablet (500 mg total) by mouth 2 (two) times daily.     Clobetasol Propionate 0.05 % shampoo  Apply 1 application topically daily.     docusate  sodium 100 MG capsule  Commonly known as:  COLACE  Take 1 capsule (100 mg total) by mouth 2 (two) times daily. Continue this while taking narcotics to help with bowel movements     EPINEPHrine 0.3 mg/0.3 mL Devi  Commonly known as:  EPIPEN  Inject 0.3 mLs (0.3 mg total) into the muscle once.     fluticasone 27.5 MCG/SPRAY nasal spray  Commonly known as:  VERAMYST  Place 1 spray into both nostrils daily as needed for allergies. One squirt  to each nostril     lisinopril 10 MG tablet  Commonly known as:  PRINIVIL,ZESTRIL  Take 10 mg by mouth daily.     MEPERITAB 50 MG tablet  Generic drug:  meperidine  Take 50 mg by mouth 3 (three) times daily as needed. For pain     omeprazole 20 MG capsule  Commonly known as:  PRILOSEC  Take 20 mg by mouth daily as needed (acid reflux).     ondansetron 4 MG tablet  Commonly known as:  ZOFRAN  Take 1 tablet (4 mg total) by mouth every 8 (eight)  hours as needed for nausea.     oxyCODONE 5 MG immediate release tablet  Commonly known as:  ROXICODONE  Take 2 tablets (10 mg total) by mouth every 4 (four) hours as needed for severe pain.     polyethylene glycol packet  Commonly known as:  MIRALAX / GLYCOLAX  Take 17 g by mouth daily as needed for mild constipation.      ASK your doctor about these medications       promethazine 25 MG tablet  Commonly known as:  PHENERGAN  Take 1 tablet (25 mg total) by mouth every 6 (six) hours as needed for nausea.        Diagnostic Studies: Dg Chest 2 View  10/06/2013   CLINICAL DATA:  Hypertension, preoperative evaluation for knee replacement  EXAM: CHEST  2 VIEW  COMPARISON:  04/28/2010  FINDINGS: The heart size and mediastinal contours are within normal limits. Both lungs are clear. The visualized skeletal structures are unremarkable. Minor degenerative changes of the thoracic spine with a slight scoliosis as before.  IMPRESSION: No active cardiopulmonary disease.   Electronically Signed   By: Ruel Favorsrevor  Shick M.D.   On: 10/06/2013 07:36   Dg Knee 1-2 Views Left  10/06/2013   CLINICAL DATA:  Postop left total knee arthroplasty.  EXAM: LEFT KNEE - 1-2 VIEW  COMPARISON:  11/19/2003.  FINDINGS: Anatomic alignment post left total knee arthroplasty. No acute complicating features.  IMPRESSION: Anatomic alignment post left knee arthroplasty without acute complicating features.   Electronically Signed   By: Hulan Saashomas  Lawrence M.D.   On: 10/06/2013 13:43    Disposition:         Follow-up Information   Follow up with Sheral ApleyMURPHY, TIMOTHY, D, MD. (as scheduled)    Specialty:  Orthopedic Surgery   Contact information:   [address] [place] [id number] [phone]        Signed: Margarita RanaMURPHY, TIMOTHY, D 10/08/2013, 7:40 AM

## 2013-10-08 NOTE — Progress Notes (Signed)
Orthopedic Tech Progress Note Patient Details:  [name] 1961-01-18 [name]512  Patient ID: Anne OchsBonnie L [name], female   DOB: 1961-01-18, 53 y.o.   MRN: [id number] placed pt's lle in cpm @ 0-60 degrees @ 2025  [name], [name] 10/08/2013, 8:23 PM

## 2013-10-08 NOTE — Progress Notes (Signed)
Physical Therapy Treatment Patient Details Name: [name] MRN: [id number] DOB: 07/15/1961 Today's Date: 10/08/2013 Time: 3664-4034 PT Time Calculation (min): 25 min  PT Assessment / Plan / Recommendation  History of Present Illness Pt is a 53 y/o female admitted s/p L TKA.   PT Comments   Pt progressing slowly towards physical therapy goals. Spoke briefly with pt regarding possible STR after d/c, as her husband just had finger/hand surgery yesterday and it is unclear how much assistance he will be able to provide at home. Pt is currently requiring significant amount of assist with movement of the LLE during bed mobility and transfers. Spoke with CM regarding situation, and CM to get in touch with CSW for options. Plan to initiate stair training this afternoon and make decision regarding home today vs. SNF vs. possible home tomorrow.  Follow Up Recommendations  Home health PT     Does the patient have the potential to tolerate intense rehabilitation     Barriers to Discharge        Equipment Recommendations  Rolling walker with 5" wheels;3in1 (PT)    Recommendations for Other Services    Frequency 7X/week   Progress towards PT Goals Progress towards PT goals: Progressing toward goals  Plan Current plan remains appropriate    Precautions / Restrictions Precautions Precautions: Fall;Knee Precaution Comments: Discussed towel roll under heel and NO pillow under knee Required Braces or Orthoses: Knee Immobilizer - Left Knee Immobilizer - Left: On when out of bed or walking Restrictions Weight Bearing Restrictions: Yes LLE Weight Bearing: Weight bearing as tolerated   Pertinent Vitals/Pain 8/10 throughout session. RN provided pain medication prior to session beginning.     Mobility  Bed Mobility Overal bed mobility: Needs Assistance Bed Mobility: Supine to Sit Supine to sit: Mod assist General bed mobility comments: Pt able to come to long sitting in the bed and bring her  RLE down onto the floor, however requires significant assistance to move the LLE around to rest on the floor as well. Pt appears to be trying to help the LLE with her LUE, however it almost feels as if she is guarding against movement due to pain.  Transfers Overall transfer level: Needs assistance Equipment used: Rolling walker (2 wheeled) Transfers: Sit to/from Stand Sit to Stand: Min assist;+2 physical assistance General transfer comment: VC's for hand placement on seated surface for safety.  Ambulation/Gait Ambulation/Gait assistance: Min guard Ambulation Distance (Feet): 30 Feet Assistive device: Rolling walker (2 wheeled) Gait Pattern/deviations: Step-to pattern;Decreased stride length Gait velocity: Decreased Gait velocity interpretation: Below normal speed for age/gender General Gait Details: VC's for sequencing and safety with the RW. Cues specifically for increased heel strike and increased step length on the R, however pt was not able to make consistent corrective changes.     Exercises Total Joint Exercises Quad Sets: 10 reps Heel Slides: 5 reps (with static hold for stretch and measurement) Goniometric ROM: 75   PT Diagnosis:    PT Problem List:   PT Treatment Interventions:     PT Goals (current goals can now be found in the care plan section) Acute Rehab PT Goals Patient Stated Goal: not stated PT Goal Formulation: With patient Time For Goal Achievement: 10/14/13 Potential to Achieve Goals: Good  Visit Information  Last PT Received On: 10/08/13 Assistance Needed: +2 (chair follow) History of Present Illness: Pt is a 53 y/o female admitted s/p L TKA.    Subjective Data  Subjective: "I'm hurting but she just  gave me a pain pill." Patient Stated Goal: not stated   Cognition  Cognition Arousal/Alertness: Awake/alert Behavior During Therapy: WFL for tasks assessed/performed Overall Cognitive Status: Within Functional Limits for tasks assessed    Balance   Balance Overall balance assessment: Needs assistance Sitting-balance support: Feet supported;Bilateral upper extremity supported Sitting balance-Leahy Scale: Fair Standing balance support: Bilateral upper extremity supported Standing balance-Leahy Scale: Poor  End of Session PT - End of Session Equipment Utilized During Treatment: Gait belt;Left knee immobilizer Activity Tolerance: Patient limited by pain Patient left: in chair;with call bell/phone within reach Nurse Communication: Mobility status CPM Left Knee CPM Left Knee: Off   GP     [name], [name] 10/08/2013, 11:56 AM  [name], PT, DPT [phone]

## 2013-10-09 ENCOUNTER — Encounter (HOSPITAL_COMMUNITY): Payer: Self-pay | Admitting: Orthopedic Surgery

## 2013-10-09 NOTE — Progress Notes (Signed)
[name], female   DOB: Mar 27, 1961, 53 y.o.   MRN: [id number]     Subjective:  Patient reports pain as mild to moderate.  Patient does not think that she will be able to go home and would now like to go to SNF  Objective:   VITALS:   Filed Vitals:   10/08/13 2216 10/09/13 0000 10/09/13 0400 10/09/13 0600  BP: 125/56   130/58  Pulse: 102   102  Temp: 98.9 F (37.2 C)   98.9 F (37.2 C)  TempSrc:      Resp: 18 18 18 18   SpO2: 100% 100% 100% 100%    ABD soft Sensation intact distally Dorsiflexion/Plantar flexion intact Incision: dressing C/D/I and no drainage   Lab Results  Component Value Date   WBC 11.8* 10/07/2013   HGB 11.3* 10/07/2013   HCT 33.0* 10/07/2013   MCV 92.4 10/07/2013   PLT 181 10/07/2013     Assessment/Plan: 3 Days Post-Op   Active Problems:   Arthritis of knee   Advance diet Up with therapy WBAT Dry dressing PRN Plan for SW consult for SNF placement    DOUGLAS PARRY, [name] 10/09/2013, 7:04 AM   Discussed and agree with above.  Eulas PostLANDAU,Primitivo Merkey P, MD

## 2013-10-09 NOTE — Progress Notes (Signed)
Clinical Social Work Department BRIEF PSYCHOSOCIAL ASSESSMENT 10/09/2013  Patient:  [name],[name]     Account Number:  [id number]     Admit date:  10/06/2013  Clinical Social Worker:  Harless NakayamaAMBELAL,Johneric Mcfadden, LCSWA  Date/Time:  10/09/2013 10:50 AM  Referred by:  Physician  Date Referred:  10/09/2013 Referred for  SNF Placement   Other Referral:   Interview type:  Patient Other interview type:    PSYCHOSOCIAL DATA Living Status:  FRIEND(S) Admitted from facility:   Level of care:   Primary support name:  Althea Grimmerameka Hendrix [phone] Primary support relationship to patient:  FRIEND Degree of support available:   Pt reports having good support ssytem    CURRENT CONCERNS Current Concerns  Post-Acute Placement   Other Concerns:    SOCIAL WORK ASSESSMENT / PLAN CSW informed pt feeling as though she cannot manage at home and wants SNF. CSW spoke with pt and pt informed CSW that the plan was to return home with a friend. However, pt friend recently had a finger surgery and now will be able to assist pt much less. Pt is concerned she will not be able to manage at home now that she has less support. CSW explained SNF referral process. [facility] but is open to any facility. Pt is aware she is ready for dc today and will need to make a decision as soon as possible.   Assessment/plan status:  Psychosocial Support/Ongoing Assessment of Needs Other assessment/ plan:   Information/referral to community resources:   SNF list to be provided with bed offers    PATIENT'S/FAMILY'S RESPONSE TO PLAN OF CARE: Pt is agreeable to SNF       Miken Stecher, LCSWA [phone]

## 2013-10-09 NOTE — Progress Notes (Addendum)
Clinical Social Work Department CLINICAL SOCIAL WORK PLACEMENT NOTE 10/09/2013  Patient:  [name],[name]  Account Number:  [id number] Admit date:  10/06/2013  Clinical Social Worker:  Sharol HarnessPOONUM Pallas Wahlert, Theresia MajorsLCSWA  Date/time:  10/09/2013 11:00 AM  Clinical Social Work is seeking post-discharge placement for this patient at the following level of care:   SKILLED NURSING   (*CSW will update this form in Epic as items are completed)   10/09/2013  [facility]family provided with [facility] Work's list of facilities offering this level of care within the geographic area requested by the patient (or if unable, by the patient's family).  10/09/2013  Patient/family informed of their freedom to choose among providers that offer the needed level of care, that participate in Medicare, Medicaid or managed care program needed by the patient, have an available bed and are willing to accept the patient.  10/09/2013  Patient/family informed of [facility]' ownership interest in [facility], as well as of the fact that they are under no obligation to receive care at this facility.  PASARR submitted to EDS on 10/09/2013 PASARR number received from EDS on 10/09/2013  FL2 transmitted to all facilities in geographic area requested by pt/family on  10/09/2013 FL2 transmitted to all facilities within larger geographic area on   Patient informed that his/her managed care company has contracts with or will negotiate with  certain facilities, including the following:     Patient/family informed of bed offers received:  10/09/2013 Patient chooses bed at -- Physician recommends and patient chooses bed at    Patient to be transferred to on HOME [facility]  The following physician request were entered in Epic:   Additional Comments:   Rhodes Calvert, LCSWA [phone]

## 2013-10-09 NOTE — Progress Notes (Addendum)
CSW (Clinical Child psychotherapistocial Worker) updated pt and informed we would be arranging for non-emergent transport. Pt asked for CSW to please hold on doing this because she has some questions. Pt wanting to know if there is a copay with her insurance. CSW has called [facility] and waiting for return phone call.   ADDENDUM: CSW has contacted multiple facilities to find out about pt copay. CSW talked to pt and explained all circumstance. At this time, pt wants to return home. Pt MD, nurse, and RN CM notified. CSW signing off.   Clark Clowdus, LCSWA [phone]

## 2013-10-09 NOTE — Progress Notes (Signed)
Physical Therapy Treatment Patient Details Name: [name] MRN: [id number] DOB: [birth year] Today's Date: 10/09/2013 Time: 4098-1191 PT Time Calculation (min): 28 min  PT Assessment / Plan / Recommendation  History of Present Illness Pt is a 53 y/o female admitted s/p L TKA.   PT Comments   Pt progressing slowly towards physical therapy goals. Spoke again with pt regarding SNF and pt agreeable. CSW entered during session and pt accepted bed offer. Will continue to see QD until d/c to SNF.   Follow Up Recommendations  SNF     Does the patient have the potential to tolerate intense rehabilitation     Barriers to Discharge        Equipment Recommendations  Rolling walker with 5" wheels;3in1 (PT)    Recommendations for Other Services    Frequency 7X/week   Progress towards PT Goals Progress towards PT goals: Progressing toward goals  Plan Discharge plan needs to be updated    Precautions / Restrictions Precautions Precautions: Fall;Knee Precaution Comments: Discussed towel roll under heel and NO pillow under knee Required Braces or Orthoses: Knee Immobilizer - Left Knee Immobilizer - Left: On when out of bed or walking Restrictions Weight Bearing Restrictions: Yes LLE Weight Bearing: Weight bearing as tolerated   Pertinent Vitals/Pain 8/10 at rest in knee and head. Pt repositioned for comfort at end of session     Mobility  Bed Mobility Overal bed mobility: Needs Assistance Bed Mobility: Supine to Sit Supine to sit: HOB elevated;Mod assist General bed mobility comments: Assist fo rmovement and support of LLE. VC's for safety as pt scoots hips out to EOB (got awfully close to edge) Transfers Overall transfer level: Needs assistance Equipment used: Rolling walker (2 wheeled) Transfers: Sit to/from Stand Sit to Stand: Min guard General transfer comment: Increased time to complete transfers due to pain but no physical assist required.  Ambulation/Gait Ambulation/Gait  assistance: Min guard Ambulation Distance (Feet): 45 Feet Assistive device: Rolling walker (2 wheeled) Gait Pattern/deviations: Step-to pattern;Decreased stride length Gait velocity: Decreased Gait velocity interpretation: Below normal speed for age/gender General Gait Details: Pt reports being very fatigued during gait training and with a headache. Pt moving slowly and unable to make corrective changes to technique.     Exercises Total Joint Exercises Quad Sets: 10 reps Heel Slides: 10 reps Hip ABduction/ADduction: 10 reps   PT Diagnosis:    PT Problem List:   PT Treatment Interventions:     PT Goals (current goals can now be found in the care plan section) Acute Rehab PT Goals Patient Stated Goal: Get stronger at rehab PT Goal Formulation: With patient Time For Goal Achievement: 10/14/13 Potential to Achieve Goals: Good  Visit Information  Last PT Received On: 10/09/13 Assistance Needed: +1 History of Present Illness: Pt is a 52 y/o female admitted s/p L TKA.    Subjective Data  Subjective: "I'm just so tired and my head hurts." Patient Stated Goal: Get stronger at rehab   Cognition  Cognition Arousal/Alertness: Awake/alert Behavior During Therapy: WFL for tasks assessed/performed Overall Cognitive Status: Within Functional Limits for tasks assessed    Balance  Balance Overall balance assessment: Needs assistance Sitting-balance support: Feet supported;Bilateral upper extremity supported Sitting balance-Leahy Scale: Fair Standing balance support: Bilateral upper extremity supported Standing balance-Leahy Scale: Poor  End of Session PT - End of Session Equipment Utilized During Treatment: Gait belt;Left knee immobilizer Activity Tolerance: Patient limited by pain Patient left: in chair;with call bell/phone within reach Nurse Communication: Mobility status CPM  Left Knee CPM Left Knee: On Left Knee Flexion (Degrees): 55 Left Knee Extension (Degrees): 0   GP      [name], [name] 10/09/2013, 12:25 PM  Anne CancerLaura [name], PT, DPT [phone]

## 2013-10-09 NOTE — Progress Notes (Signed)
Orthopedic Tech Progress Note Patient Details:  [name] Sylvia Aug 13, 1961 [id number]  [name] Valencia, female   DOB: Aug 13, 1961, 53 y.o.   MRN: [id number] Placed pt's lle in cpm @ 0-55 degrees@ 1500  Nikki DomCrawford, Erick Murin 10/09/2013, 2:58 PM

## 2013-10-09 NOTE — Progress Notes (Signed)
Patient discharged to home accompanied by family. Discharge instructions and rx given and explained and pt stated understanding. IV was removed prior to discharge. Patient left unit in stable condition via wheelchair.

## 2013-10-13 ENCOUNTER — Telehealth: Payer: Self-pay | Admitting: Family Medicine

## 2013-10-13 MED ORDER — METHOCARBAMOL 500 MG PO TABS: 500.0000 mg | ORAL_TABLET | Freq: Three times a day (TID) | ORAL | Status: DC | PRN

## 2013-10-13 NOTE — Telephone Encounter (Signed)
Pt called because she had total knee replacement on 2/17 and is still having muscle spasms. She wanted to know if the doctor could call in some muscle relaxer's? Also she still has some blood in her urine. Please advise jw

## 2013-10-13 NOTE — Telephone Encounter (Signed)
Pt informed about Rx and need for appt.  She states that she will have to come at a later date for the hematuria because she just had surgery on 2/17. Fleeger, Maryjo RochesterJessica Dawn

## 2013-10-13 NOTE — Telephone Encounter (Signed)
Robaxin sent to [facility]. She will need an appointment for blood in urine.  Thanks, Continental Airlinesmber M. Donte Kary, M.D.

## 2013-10-17 ENCOUNTER — Other Ambulatory Visit: Payer: Self-pay | Admitting: Family Medicine

## 2013-10-21 ENCOUNTER — Ambulatory Visit (INDEPENDENT_AMBULATORY_CARE_PROVIDER_SITE_OTHER): Payer: Managed Care, Other (non HMO) | Admitting: Family Medicine

## 2013-10-21 ENCOUNTER — Encounter: Payer: Self-pay | Admitting: Family Medicine

## 2013-10-21 VITALS — BP 111/72 | HR 106 | Temp 98.1°F | Wt 263.0 lb

## 2013-10-21 DIAGNOSIS — R300 Dysuria: Secondary | ICD-10-CM

## 2013-10-21 DIAGNOSIS — R82998 Other abnormal findings in urine: Secondary | ICD-10-CM

## 2013-10-21 LAB — POCT URINALYSIS DIPSTICK
Blood, UA: NEGATIVE
GLUCOSE UA: NEGATIVE
Ketones, UA: NEGATIVE
NITRITE UA: NEGATIVE
PROTEIN UA: 30
Spec Grav, UA: >=1.03
UROBILINOGEN UA: 2
pH, UA: 6

## 2013-10-21 LAB — POCT UA - MICROSCOPIC ONLY: Epithelial cells, urine per micros: >20

## 2013-10-21 MED ORDER — CEPHALEXIN 250 MG PO CAPS: 250.0000 mg | ORAL_CAPSULE | Freq: Four times a day (QID) | ORAL | Status: DC

## 2013-10-21 NOTE — Patient Instructions (Addendum)
Good to see you today!  Thanks for coming in.  We think you have at UTI.  We will culture your urine and let you know if you are not on the correct antibiotics  Take the keflex 4 times a day until all gone  Your dark urine shuold be better in 2-3 days  Drink at least 4-6 bottles or glasses of water a day  If your urine is not back to normal in one week come back

## 2013-10-21 NOTE — Progress Notes (Signed)
Subjective:     Patient ID: Anne OchsBonnie L [name], female   DOB: 05-Dec-1960, 53 y.o.   MRN: [id number]  HPI:  [name] is a 53 yo female with a history of dysuria, OSA, HTN, GERD, and osteoarthritis, that presents with tea-colored urine.  She first noticed this dark urine on 10/06/13 after her L knee replacement.  In the hospital, her urine was [employer] colored.  At the time, she was told that she was dehydrated and to drink more water.  Since then, she has had about 3-4 bottles of water a day, which is the most she can take.  Over the past 2 weeks, the urine may have become slightly lighter, but in general, it has not resolved.  It is associated with dysuria and bladder-type pain.  She has noticed one instance of blood in her urine.  There has been no frequency, fever, back pain, or flank pain.    In early February, she had 2 UTIs (2/3 and 2/9), causing dysuria, both of which were treated with Ciprofloxacin.  These infections were not associated with hematuria or tea-colored urine. Urinalysis for these conditions showed mild LE, negative nitrites.  Urine culture produced no growth.  However, the Cipro cleared these infections.    Review of Systems Endorses fatigue (ever since the surgery); no lightheadedness or dizziness.  No diarrhea or constipation, no blood in the stool.    Objective:   Physical Exam HEENT:  No sclera ictera, no conjunctival pallor CV:  Normal rate and rhythm, no m/r/g Pulm:  Clear in all lung field, no wheezes, rales, rhonchi GI:  Normoactive bowel sounds, no abdominal tenderness on palpation GU:  Mild suprapubic pain on palpation    Assessment:      [name] is a 53 yo female with a history of dysuria, OSA, HTN, GERD, and osteoarthritis, that presents with tea-colored urine.  Her urinalysis done on presentation showed >20 epithelial cells, >20 WBC, small amounts of bilirubin, 1+ LE, negative nitrites, and a specific gravity > 1.030.  This is similar to her previous episodes  of UTI.  Start on Keflex for 7 days, given her age, comorbidities and recent surgery.   Her SG also indicates slight dehydration. She should increase her water intake.     Plan:     1.  Cephalexin (Keflex) 250 mg qid x 7 days 2. Drink 4-6 glasses of water per day.

## 2013-10-22 NOTE — Assessment & Plan Note (Signed)
Her urinalysis done on presentation showed >20 epithelial cells, >20 WBC, small amounts of bilirubin, 1+ LE, negative nitrites, and a specific gravity > 1.030. This is similar to her previous episodes of UTI. Start on Keflex for 7 days, given her age, comorbidities and recent surgery. Her SG also indicates slight dehydration. She should increase her water intake.

## 2013-10-28 ENCOUNTER — Encounter (HOSPITAL_COMMUNITY): Payer: Self-pay

## 2013-10-29 NOTE — H&P (Signed)
  [facility] [address]   [address] [place], [place] [id number] [phone] A Division of [facility]  [name] F. Murphy, M.D.   [name], M.D.   [name], M.D.   [name], M.D.   [name], M.D. [name], M.D.  [name], M.D.  [name], M.D.    [name], M.D. Anne L. Isidoro DonningAnton, PA-C  Anne A. Shepperson, PA-C  Anne Schnecksvillehadwell, PA-C [place]andon [name], [place]-C    RE: [name], [name]   [id number]      DOB: [birth year] PROGRESS NOTE: 10-21-13  History of present illness:  She is here for follow-up examination of her left knee.  She had left total knee arthroplasty performed on 2 /17/2015.  She states she is doing reasonably well and has minimal pain radiating at high as 5/10.  She has been struggling with pain but states she is much better than she was prior to surgery.      Please see associated documentation for this clinic visit for further past medical, family, surgical and social history, review of systems, and exam findings as this was reviewed by me.  EXAMINATION: On examination, she has sensation intact throughout the left lower extremity.  Active range of motion from 0 to 110 degrees of flexion.  She is stable to varus and valgus stress.    X-RAYS: X-rays reviewed by me:   2-views of the left knee show acceptable sizing and placement of the Stryker total knee components with no evidence of implant loosening or failure.    IMPRESSION: Left knee end-stage degenerative arthritis, status post total knee arthroplasty.  PLAN: She is doing very well as far as her left knee is concerned.  She has gotten such relief from the left knee that she is ready to discuss having her right knee done.  This knee also has end-stage degenerative arthritis and is extremely symptomatic for her.  She will finish up home physical therapy today and progress to outpatient physical therapy.  We are  going to plan to schedule her for her right total knee arthroplasty but we have cautioned her that she should wait 2-4 more weeks before having this done so that she has optimized her left knee and is able to rehabilitate her right knee once it is  replaced.  She understands this and will proceed accordingly.  We will plan to see her back in the office in four weeks for her left knee and post-op for her right knee accordingly.  Anne Baizeimothy D.  Eulah [name], M.D. Dictated by Sherlynn CarbonBrandon D. Juan QuamParry, OPA-C Electronically verified by [name], M.D.  TDM(BDP):gde D 10-21-13 T 10-22-13

## 2013-10-31 NOTE — Pre-Procedure Instructions (Signed)
Anne OchsBonnie L [name]  10/31/2013   Your procedure is scheduled on:  March 23  [facility] [address] at 10:00 AM.  Call this number if you have problems the morning of surgery: [phone]   Remember:   Do not eat food or drink liquids after midnight.   Take these medicines the morning of surgery with A SIP OF WATER: Flonase, Veramyst, Zofran (if needed), Oxycodone (if needed)   STOP Aspirin, Hair, skin, and nails vitamin today   STOP/ Do not take Aspirin, Aleve, Naproxen, Advil, Ibuprofen, Vitamin, Herbs, or Supplements starting today   Do not wear jewelry, make-up or nail polish.  Do not wear lotions, powders, or perfumes. You may not wear deodorant.  Do not shave 48 hours prior to surgery. Men may shave face and neck.  Do not bring valuables to the hospital.  [facility] is not [facility] for any belongings or valuables.               Contacts, dentures or bridgework may not be worn into surgery.  Leave suitcase in the car. After surgery it may be brought to your room.  For patients admitted to the hospital, discharge time is determined by your treatment team.               Special Instructions: See [facility] Preparing For [facility]   Please read over the following fact sheets that you were given: Pain Booklet, Coughing and Deep Breathing, Blood Transfusion Information and Surgical Site Infection Prevention

## 2013-10-31 NOTE — Pre-Procedure Instructions (Signed)
[facility] - Preparing for Surgery  Before surgery, you can play an important role.  Because skin is not sterile, your skin needs to be as free of germs as possible.  You can reduce the number of germs on you skin by washing with CHG (chlorahexidine gluconate) soap before surgery.  CHG is an antiseptic cleaner which kills germs and bonds with the skin to continue killing germs even after washing.  Please DO NOT use if you have an allergy to CHG or antibacterial soaps.  If your skin becomes reddened/irritated stop using the CHG and inform your nurse when you arrive at Short Stay.  Do not shave (including legs and underarms) for at least 48 hours prior to the first CHG shower.  You may shave your face.  Please follow these instructions carefully:   1.  Shower with CHG Soap the night before surgery and the morning of Surgery.  2.  If you choose to wash your hair, wash your hair first as usual with your normal shampoo.  3.  After you shampoo, rinse your hair and body thoroughly to remove the shampoo.  4.  Use CHG as you would any other liquid soap.  You can apply CHG directly to the skin and wash gently with scrungie or a clean washcloth.  5.  Apply the CHG Soap to your body ONLY FROM THE NECK DOWN.  Do not use on open wounds or open sores.  Avoid contact with your eyes, ears, mouth and genitals (private parts).  Wash genitals (private parts) with your normal soap.  6.  Wash thoroughly, paying special attention to the area where your surgery will be performed.  7.  Thoroughly rinse your body with warm water from the neck down.  8.  DO NOT shower/wash with your normal soap after using and rinsing off the CHG Soap.  9.  Pat yourself dry with a clean towel.            10.  Wear clean pajamas.            11.  Place clean sheets on your bed the night of your first shower and do not sleep with pets.  Day of Surgery  Do not apply any lotions the morning of surgery.  Please wear clean clothes to the  hospital/surgery center.   

## 2013-11-02 ENCOUNTER — Encounter (HOSPITAL_COMMUNITY): Payer: Self-pay

## 2013-11-02 ENCOUNTER — Encounter (HOSPITAL_COMMUNITY)
Admission: RE | Admit: 2013-11-02 | Discharge: 2013-11-02 | Disposition: A | Discharge status: Home / Self Care | Payer: Managed Care, Other (non HMO) | Source: Ambulatory Visit | Attending: Orthopedic Surgery | Admitting: Orthopedic Surgery

## 2013-11-02 DIAGNOSIS — Z01812 Encounter for preprocedural laboratory examination: Secondary | ICD-10-CM | POA: Insufficient documentation

## 2013-11-02 HISTORY — DX: Fatty (change of) liver, not elsewhere classified: K76.0

## 2013-11-02 LAB — URINALYSIS, ROUTINE W REFLEX MICROSCOPIC
GLUCOSE, UA: NEGATIVE mg/dL
Hgb urine dipstick: NEGATIVE
Ketones, ur: NEGATIVE mg/dL
Nitrite: NEGATIVE
PH: 5 (ref 5.0–8.0)
Protein, ur: NEGATIVE mg/dL
Specific Gravity, Urine: 1.036 — ABNORMAL HIGH (ref 1.005–1.030)
Urobilinogen, UA: 1 mg/dL (ref 0.0–1.0)

## 2013-11-02 LAB — PROTIME-INR
INR: 1.14 (ref 0.00–1.49)
Prothrombin Time: 14.4 seconds (ref 11.6–15.2)

## 2013-11-02 LAB — URINE MICROSCOPIC-ADD ON

## 2013-11-02 LAB — BASIC METABOLIC PANEL
BUN: 12 mg/dL (ref 6–23)
CALCIUM: 9.2 mg/dL (ref 8.4–10.5)
CO2: 25 mEq/L (ref 19–32)
CREATININE: 0.75 mg/dL (ref 0.50–1.10)
Chloride: 101 mEq/L (ref 96–112)
GFR calc non Af Amer: >90 mL/min (ref >90)
Glucose, Bld: 143 mg/dL — ABNORMAL HIGH (ref 70–99)
Potassium: 4.2 mEq/L (ref 3.7–5.3)
Sodium: 141 mEq/L (ref 137–147)

## 2013-11-02 LAB — CBC
HCT: 38.2 % (ref 36.0–46.0)
Hemoglobin: 12.4 g/dL (ref 12.0–15.0)
MCH: 30.5 pg (ref 26.0–34.0)
MCHC: 32.5 g/dL (ref 30.0–36.0)
MCV: 94.1 fL (ref 78.0–100.0)
Platelets: 257 10*3/uL (ref 150–400)
RBC: 4.06 MIL/uL (ref 3.87–5.11)
RDW: 13.6 % (ref 11.5–15.5)
WBC: 4.2 10*3/uL (ref 4.0–10.5)

## 2013-11-02 LAB — SURGICAL PCR SCREEN
MRSA, PCR: NEGATIVE
Staphylococcus aureus: NEGATIVE

## 2013-11-02 LAB — TYPE AND SCREEN
ABO/RH(D): A POS
Antibody Screen: NEGATIVE

## 2013-11-03 LAB — URINE CULTURE
CULTURE: NO GROWTH
Colony Count: NO GROWTH

## 2013-11-03 NOTE — Progress Notes (Signed)
Spoke with [name] at Dr. Thurston HoleWainer, [name]'s practice to make MD aware that pt had an abnormal urine result. [facility], Dr. Margarita Ranaimothy [name] is unavailable but another MD will be made aware.

## 2013-11-08 MED ORDER — CEFAZOLIN SODIUM-DEXTROSE 2-3 GM-% IV SOLR
2.0000 g | INTRAVENOUS | Status: AC
  Administered 2013-11-09: 2 g via INTRAVENOUS
  Filled 2013-11-08: qty 50

## 2013-11-09 ENCOUNTER — Inpatient Hospital Stay (HOSPITAL_COMMUNITY)
Admission: RE | Admit: 2013-11-09 | Discharge: 2013-11-13 | DRG: 470 | Disposition: A | Discharge status: Home Health | Payer: Managed Care, Other (non HMO) | Source: Ambulatory Visit | Attending: Orthopedic Surgery | Admitting: Orthopedic Surgery

## 2013-11-09 ENCOUNTER — Encounter (HOSPITAL_COMMUNITY): Payer: Managed Care, Other (non HMO) | Admitting: Anesthesiology

## 2013-11-09 ENCOUNTER — Inpatient Hospital Stay (HOSPITAL_COMMUNITY): Payer: Managed Care, Other (non HMO)

## 2013-11-09 ENCOUNTER — Inpatient Hospital Stay (HOSPITAL_COMMUNITY): Payer: Managed Care, Other (non HMO) | Admitting: Anesthesiology

## 2013-11-09 ENCOUNTER — Encounter (HOSPITAL_COMMUNITY): Payer: Self-pay | Admitting: *Deleted

## 2013-11-09 ENCOUNTER — Encounter (HOSPITAL_COMMUNITY)
Admission: RE | Disposition: A | Discharge status: Home Health | Payer: Self-pay | Source: Ambulatory Visit | Attending: Orthopedic Surgery

## 2013-11-09 DIAGNOSIS — K219 Gastro-esophageal reflux disease without esophagitis: Secondary | ICD-10-CM | POA: Diagnosis present

## 2013-11-09 DIAGNOSIS — I10 Essential (primary) hypertension: Secondary | ICD-10-CM | POA: Diagnosis present

## 2013-11-09 DIAGNOSIS — G4730 Sleep apnea, unspecified: Secondary | ICD-10-CM | POA: Diagnosis present

## 2013-11-09 DIAGNOSIS — Z79899 Other long term (current) drug therapy: Secondary | ICD-10-CM

## 2013-11-09 DIAGNOSIS — Z7982 Long term (current) use of aspirin: Secondary | ICD-10-CM

## 2013-11-09 DIAGNOSIS — M1710 Unilateral primary osteoarthritis, unspecified knee: Principal | ICD-10-CM | POA: Diagnosis present

## 2013-11-09 DIAGNOSIS — K7689 Other specified diseases of liver: Secondary | ICD-10-CM | POA: Diagnosis present

## 2013-11-09 DIAGNOSIS — Z96659 Presence of unspecified artificial knee joint: Secondary | ICD-10-CM

## 2013-11-09 HISTORY — PX: TOTAL KNEE ARTHROPLASTY: SHX125

## 2013-11-09 SURGERY — ARTHROPLASTY, KNEE, TOTAL
Anesthesia: General | Site: Knee | Laterality: Right

## 2013-11-09 MED ORDER — DOCUSATE SODIUM 100 MG PO CAPS: 100.0000 mg | ORAL_CAPSULE | Freq: Two times a day (BID) | ORAL | Status: DC

## 2013-11-09 MED ORDER — DEXAMETHASONE SODIUM PHOSPHATE 10 MG/ML IJ SOLN
10.0000 mg | Freq: Three times a day (TID) | INTRAMUSCULAR | Status: AC
  Administered 2013-11-09: 10 mg via INTRAVENOUS
  Filled 2013-11-09 (×2): qty 1

## 2013-11-09 MED ORDER — PHENYLEPHRINE 40 MCG/ML (10ML) SYRINGE FOR IV PUSH (FOR BLOOD PRESSURE SUPPORT)
PREFILLED_SYRINGE | INTRAVENOUS | Status: AC
  Filled 2013-11-09: qty 10

## 2013-11-09 MED ORDER — MENTHOL 3 MG MT LOZG: 1.0000 | LOZENGE | OROMUCOSAL | Status: DC | PRN

## 2013-11-09 MED ORDER — HYDROMORPHONE HCL PF 1 MG/ML IJ SOLN
INTRAMUSCULAR | Status: AC
  Administered 2013-11-09: 0.5 mg via INTRAVENOUS
  Filled 2013-11-09: qty 1

## 2013-11-09 MED ORDER — ACETAMINOPHEN 500 MG PO TABS
1000.0000 mg | ORAL_TABLET | Freq: Once | ORAL | Status: AC
  Administered 2013-11-09: 1000 mg via ORAL

## 2013-11-09 MED ORDER — FENTANYL CITRATE 0.05 MG/ML IJ SOLN
INTRAMUSCULAR | Status: DC | PRN
  Administered 2013-11-09: 150 ug via INTRAVENOUS
  Administered 2013-11-09: 50 ug via INTRAVENOUS
  Administered 2013-11-09: 100 ug via INTRAVENOUS
  Administered 2013-11-09: 50 ug via INTRAVENOUS
  Administered 2013-11-09: 100 ug via INTRAVENOUS
  Administered 2013-11-09: 50 ug via INTRAVENOUS

## 2013-11-09 MED ORDER — PROMETHAZINE HCL 25 MG/ML IJ SOLN
INTRAMUSCULAR | Status: AC
  Administered 2013-11-09: 6.25 mg via INTRAVENOUS
  Filled 2013-11-09: qty 1

## 2013-11-09 MED ORDER — MIDAZOLAM HCL 2 MG/2ML IJ SOLN
INTRAMUSCULAR | Status: AC
  Filled 2013-11-09: qty 2

## 2013-11-09 MED ORDER — ONDANSETRON HCL 4 MG/2ML IJ SOLN
INTRAMUSCULAR | Status: DC | PRN
  Administered 2013-11-09: 4 mg via INTRAVENOUS

## 2013-11-09 MED ORDER — GLYCOPYRROLATE 0.2 MG/ML IJ SOLN
INTRAMUSCULAR | Status: DC | PRN
  Administered 2013-11-09: .4 mg via INTRAVENOUS

## 2013-11-09 MED ORDER — OXYCODONE HCL 5 MG PO TABS: 10.0000 mg | ORAL_TABLET | ORAL | Status: DC | PRN

## 2013-11-09 MED ORDER — FLUTICASONE PROPIONATE 50 MCG/ACT NA SUSP: 1.0000 | Freq: Every day | NASAL | Status: DC | PRN

## 2013-11-09 MED ORDER — BUPIVACAINE HCL (PF) 0.25 % IJ SOLN
INTRAMUSCULAR | Status: AC
  Filled 2013-11-09: qty 30

## 2013-11-09 MED ORDER — HYDRALAZINE HCL 20 MG/ML IJ SOLN
INTRAMUSCULAR | Status: DC | PRN
  Administered 2013-11-09 (×4): 5 mg via INTRAVENOUS

## 2013-11-09 MED ORDER — DEXAMETHASONE SODIUM PHOSPHATE 10 MG/ML IJ SOLN
INTRAMUSCULAR | Status: DC | PRN
  Administered 2013-11-09: 6 mg

## 2013-11-09 MED ORDER — GLYCOPYRROLATE 0.2 MG/ML IJ SOLN
INTRAMUSCULAR | Status: AC
Start: 2013-11-09 — End: 2013-11-09
  Filled 2013-11-09: qty 2

## 2013-11-09 MED ORDER — METHOCARBAMOL 100 MG/ML IJ SOLN
500.0000 mg | Freq: Three times a day (TID) | INTRAMUSCULAR | Status: DC
  Administered 2013-11-09 – 2013-11-11 (×5): 500 mg via INTRAVENOUS
  Filled 2013-11-09 (×12): qty 5

## 2013-11-09 MED ORDER — METOCLOPRAMIDE HCL 5 MG/ML IJ SOLN
5.0000 mg | Freq: Three times a day (TID) | INTRAMUSCULAR | Status: DC | PRN
  Administered 2013-11-12 – 2013-11-13 (×3): 10 mg via INTRAVENOUS
  Filled 2013-11-09 (×3): qty 2

## 2013-11-09 MED ORDER — ROCURONIUM BROMIDE 50 MG/5ML IV SOLN
INTRAVENOUS | Status: AC
  Filled 2013-11-09: qty 1

## 2013-11-09 MED ORDER — MORPHINE SULFATE 2 MG/ML IJ SOLN
INTRAMUSCULAR | Status: AC
  Administered 2013-11-09: 2 mg via INTRAVENOUS
  Filled 2013-11-09: qty 1

## 2013-11-09 MED ORDER — OXYCODONE HCL 5 MG PO TABS
ORAL_TABLET | ORAL | Status: AC
  Administered 2013-11-09: 10 mg via ORAL
  Filled 2013-11-09: qty 2

## 2013-11-09 MED ORDER — ARTIFICIAL TEARS OP OINT
TOPICAL_OINTMENT | OPHTHALMIC | Status: DC | PRN
  Administered 2013-11-09: 1 via OPHTHALMIC

## 2013-11-09 MED ORDER — OXYCODONE HCL 5 MG PO TABS
5.0000 mg | ORAL_TABLET | ORAL | Status: DC | PRN
  Administered 2013-11-09 – 2013-11-13 (×19): 10 mg via ORAL
  Filled 2013-11-09 (×18): qty 2

## 2013-11-09 MED ORDER — DEXAMETHASONE SODIUM PHOSPHATE 10 MG/ML IJ SOLN
INTRAMUSCULAR | Status: AC
  Administered 2013-11-09: 10 mg via INTRAVENOUS
  Filled 2013-11-09: qty 1

## 2013-11-09 MED ORDER — ONDANSETRON HCL 4 MG PO TABS: 4.0000 mg | ORAL_TABLET | Freq: Three times a day (TID) | ORAL | Status: DC | PRN

## 2013-11-09 MED ORDER — ONDANSETRON HCL 4 MG/2ML IJ SOLN
4.0000 mg | Freq: Four times a day (QID) | INTRAMUSCULAR | Status: DC | PRN
  Administered 2013-11-12: 4 mg via INTRAVENOUS
  Filled 2013-11-09: qty 2

## 2013-11-09 MED ORDER — LACTATED RINGERS IV SOLN
INTRAVENOUS | Status: DC
  Administered 2013-11-09: 10:00:00 via INTRAVENOUS

## 2013-11-09 MED ORDER — METOCLOPRAMIDE HCL 10 MG PO TABS: 5.0000 mg | ORAL_TABLET | Freq: Three times a day (TID) | ORAL | Status: DC | PRN

## 2013-11-09 MED ORDER — ASPIRIN EC 325 MG PO TBEC
325.0000 mg | DELAYED_RELEASE_TABLET | Freq: Every day | ORAL | Status: DC
  Administered 2013-11-10 – 2013-11-12 (×3): 325 mg via ORAL
  Filled 2013-11-09 (×5): qty 1

## 2013-11-09 MED ORDER — FENTANYL CITRATE 0.05 MG/ML IJ SOLN
INTRAMUSCULAR | Status: AC
  Filled 2013-11-09: qty 5

## 2013-11-09 MED ORDER — ASPIRIN EC 325 MG PO TBEC: 325.0000 mg | DELAYED_RELEASE_TABLET | Freq: Every day | ORAL | Status: DC

## 2013-11-09 MED ORDER — DEXAMETHASONE 4 MG PO TABS
10.0000 mg | ORAL_TABLET | Freq: Three times a day (TID) | ORAL | Status: AC
  Administered 2013-11-09 – 2013-11-10 (×2): 10 mg via ORAL
  Filled 2013-11-09 (×2): qty 1

## 2013-11-09 MED ORDER — DEXTROSE-NACL 5-0.45 % IV SOLN
INTRAVENOUS | Status: AC
  Administered 2013-11-09: 21:00:00 via INTRAVENOUS

## 2013-11-09 MED ORDER — DOCUSATE SODIUM 100 MG PO CAPS
100.0000 mg | ORAL_CAPSULE | Freq: Two times a day (BID) | ORAL | Status: DC
  Administered 2013-11-09 – 2013-11-12 (×7): 100 mg via ORAL
  Filled 2013-11-09 (×11): qty 1

## 2013-11-09 MED ORDER — BUPIVACAINE LIPOSOME 1.3 % IJ SUSP
20.0000 mL | Freq: Once | INTRAMUSCULAR | Status: DC
  Filled 2013-11-09: qty 20

## 2013-11-09 MED ORDER — PROPOFOL 10 MG/ML IV BOLUS
INTRAVENOUS | Status: DC | PRN
  Administered 2013-11-09: 200 mg via INTRAVENOUS

## 2013-11-09 MED ORDER — STERILE WATER FOR INJECTION IJ SOLN
INTRAMUSCULAR | Status: AC
  Filled 2013-11-09: qty 10

## 2013-11-09 MED ORDER — DEXTROSE-NACL 5-0.45 % IV SOLN
100.0000 mL/h | INTRAVENOUS | Status: DC
  Administered 2013-11-09: 100 mL/h via INTRAVENOUS

## 2013-11-09 MED ORDER — NEOSTIGMINE METHYLSULFATE 1 MG/ML IJ SOLN
INTRAMUSCULAR | Status: AC
  Filled 2013-11-09: qty 10

## 2013-11-09 MED ORDER — HYDROMORPHONE HCL PF 1 MG/ML IJ SOLN
INTRAMUSCULAR | Status: AC
  Filled 2013-11-09: qty 1

## 2013-11-09 MED ORDER — ACETAMINOPHEN 650 MG RE SUPP: 650.0000 mg | Freq: Four times a day (QID) | RECTAL | Status: DC | PRN

## 2013-11-09 MED ORDER — LISINOPRIL 10 MG PO TABS
10.0000 mg | ORAL_TABLET | Freq: Every day | ORAL | Status: DC
  Administered 2013-11-10 – 2013-11-12 (×2): 10 mg via ORAL
  Filled 2013-11-09 (×5): qty 1

## 2013-11-09 MED ORDER — MIDAZOLAM HCL 2 MG/2ML IJ SOLN
INTRAMUSCULAR | Status: AC
  Administered 2013-11-09: 2 mg
  Filled 2013-11-09: qty 2

## 2013-11-09 MED ORDER — ACETAMINOPHEN 325 MG PO TABS: 650.0000 mg | ORAL_TABLET | Freq: Four times a day (QID) | ORAL | Status: DC | PRN

## 2013-11-09 MED ORDER — HYDROMORPHONE HCL PF 1 MG/ML IJ SOLN
INTRAMUSCULAR | Status: AC
  Administered 2013-11-09: 1 mg via INTRAVENOUS
  Filled 2013-11-09: qty 1

## 2013-11-09 MED ORDER — LACTATED RINGERS IV SOLN
INTRAVENOUS | Status: DC | PRN
  Administered 2013-11-09 (×2): via INTRAVENOUS

## 2013-11-09 MED ORDER — FENTANYL CITRATE 0.05 MG/ML IJ SOLN
INTRAMUSCULAR | Status: AC
  Administered 2013-11-09: 100 ug
  Filled 2013-11-09: qty 2

## 2013-11-09 MED ORDER — PHENOL 1.4 % MT LIQD: 1.0000 | OROMUCOSAL | Status: DC | PRN

## 2013-11-09 MED ORDER — BUPIVACAINE-EPINEPHRINE PF 0.5-1:200000 % IJ SOLN
INTRAMUSCULAR | Status: DC | PRN
  Administered 2013-11-09: 100 mg via PERINEURAL

## 2013-11-09 MED ORDER — MIDAZOLAM HCL 5 MG/5ML IJ SOLN
INTRAMUSCULAR | Status: DC | PRN
  Administered 2013-11-09: 2 mg via INTRAVENOUS

## 2013-11-09 MED ORDER — CHLORHEXIDINE GLUCONATE 4 % EX LIQD
60.0000 mL | Freq: Once | CUTANEOUS | Status: DC
  Filled 2013-11-09: qty 60

## 2013-11-09 MED ORDER — PROPOFOL 10 MG/ML IV BOLUS
INTRAVENOUS | Status: AC
Start: 2013-11-09 — End: 2013-11-09
  Filled 2013-11-09: qty 20

## 2013-11-09 MED ORDER — MORPHINE SULFATE 2 MG/ML IJ SOLN
2.0000 mg | INTRAMUSCULAR | Status: DC | PRN
  Administered 2013-11-09 (×2): 2 mg via INTRAVENOUS

## 2013-11-09 MED ORDER — MORPHINE SULFATE 2 MG/ML IJ SOLN
INTRAMUSCULAR | Status: AC
  Filled 2013-11-09: qty 1

## 2013-11-09 MED ORDER — HYDROMORPHONE HCL PF 1 MG/ML IJ SOLN
0.2500 mg | INTRAMUSCULAR | Status: DC | PRN
Start: 2013-11-09 — End: 2013-11-09
  Administered 2013-11-09 (×3): 0.5 mg via INTRAVENOUS

## 2013-11-09 MED ORDER — CEFAZOLIN SODIUM-DEXTROSE 2-3 GM-% IV SOLR
2.0000 g | Freq: Four times a day (QID) | INTRAVENOUS | Status: AC
  Administered 2013-11-09 – 2013-11-10 (×2): 2 g via INTRAVENOUS
  Filled 2013-11-09 (×2): qty 50

## 2013-11-09 MED ORDER — NEOSTIGMINE METHYLSULFATE 1 MG/ML IJ SOLN
INTRAMUSCULAR | Status: DC | PRN
  Administered 2013-11-09: 3 mg via INTRAVENOUS

## 2013-11-09 MED ORDER — SODIUM CHLORIDE 0.9 % IR SOLN
Status: DC | PRN
Start: 2013-11-09 — End: 2013-11-09
  Administered 2013-11-09: 3000 mL

## 2013-11-09 MED ORDER — BUPIVACAINE LIPOSOME 1.3 % IJ SUSP
INTRAMUSCULAR | Status: DC | PRN
  Administered 2013-11-09: 20 mL

## 2013-11-09 MED ORDER — SCOPOLAMINE 1 MG/3DAYS TD PT72
MEDICATED_PATCH | TRANSDERMAL | Status: DC | PRN
  Administered 2013-11-09: 1 via TRANSDERMAL

## 2013-11-09 MED ORDER — PROMETHAZINE HCL 25 MG/ML IJ SOLN
6.2500 mg | INTRAMUSCULAR | Status: DC | PRN
  Administered 2013-11-09: 6.25 mg via INTRAVENOUS

## 2013-11-09 MED ORDER — DEXAMETHASONE SODIUM PHOSPHATE 10 MG/ML IJ SOLN
INTRAMUSCULAR | Status: AC
  Filled 2013-11-09: qty 1

## 2013-11-09 MED ORDER — ARTIFICIAL TEARS OP OINT
TOPICAL_OINTMENT | OPHTHALMIC | Status: AC
  Filled 2013-11-09: qty 3.5

## 2013-11-09 MED ORDER — ROCURONIUM BROMIDE 100 MG/10ML IV SOLN
INTRAVENOUS | Status: DC | PRN
  Administered 2013-11-09: 50 mg via INTRAVENOUS

## 2013-11-09 MED ORDER — PHENYLEPHRINE HCL 10 MG/ML IJ SOLN
INTRAMUSCULAR | Status: DC | PRN
  Administered 2013-11-09: 120 ug via INTRAVENOUS

## 2013-11-09 MED ORDER — METHOCARBAMOL 500 MG PO TABS
500.0000 mg | ORAL_TABLET | Freq: Three times a day (TID) | ORAL | Status: DC | PRN
  Administered 2013-11-10 – 2013-11-12 (×5): 500 mg via ORAL
  Filled 2013-11-09 (×5): qty 1

## 2013-11-09 MED ORDER — HYDROMORPHONE HCL PF 1 MG/ML IJ SOLN
1.0000 mg | INTRAMUSCULAR | Status: DC | PRN
  Administered 2013-11-09 – 2013-11-13 (×10): 1 mg via INTRAVENOUS
  Filled 2013-11-09 (×9): qty 1

## 2013-11-09 MED ORDER — ONDANSETRON HCL 4 MG PO TABS
4.0000 mg | ORAL_TABLET | Freq: Four times a day (QID) | ORAL | Status: DC | PRN
  Administered 2013-11-09 – 2013-11-12 (×5): 4 mg via ORAL
  Filled 2013-11-09 (×5): qty 1

## 2013-11-09 SURGICAL SUPPLY — 69 items
BANDAGE ESMARK 6X9 LF (GAUZE/BANDAGES/DRESSINGS) ×1 IMPLANT
BENZOIN TINCTURE PRP APPL 2/3 (GAUZE/BANDAGES/DRESSINGS) ×2 IMPLANT
BLADE SAG 18X100X1.27 (BLADE) ×4 IMPLANT
BNDG ELASTIC 6X10 VLCR STRL LF (GAUZE/BANDAGES/DRESSINGS) ×2 IMPLANT
BNDG ESMARK 6X9 LF (GAUZE/BANDAGES/DRESSINGS) ×2
BOWL SMART MIX CTS (DISPOSABLE) ×2 IMPLANT
CEMENT BONE SIMPLEX SPEEDSET (Cement) ×4 IMPLANT
CHLORAPREP W/TINT 26ML (MISCELLANEOUS) ×2 IMPLANT
COVER SURGICAL LIGHT HANDLE (MISCELLANEOUS) ×2 IMPLANT
CUFF TOURNIQUET SINGLE 34IN LL (TOURNIQUET CUFF) ×2 IMPLANT
DRAPE EXTREMITY T 121X128X90 (DRAPE) ×2 IMPLANT
DRAPE PROXIMA HALF (DRAPES) ×2 IMPLANT
DRAPE U-SHAPE 47X51 STRL (DRAPES) ×2 IMPLANT
DRSG ADAPTIC 3X8 NADH LF (GAUZE/BANDAGES/DRESSINGS) ×2 IMPLANT
DRSG PAD ABDOMINAL 8X10 ST (GAUZE/BANDAGES/DRESSINGS) ×2 IMPLANT
ELECT CAUTERY BLADE 6.4 (BLADE) ×2 IMPLANT
ELECT REM PT RETURN 9FT ADLT (ELECTROSURGICAL) ×2
ELECTRODE REM PT RTRN 9FT ADLT (ELECTROSURGICAL) ×1 IMPLANT
EVACUATOR 1/8 PVC DRAIN (DRAIN) ×2 IMPLANT
FACESHIELD LNG OPTICON STERILE (SAFETY) ×2 IMPLANT
GLOVE BIO SURGEON STRL SZ7 (GLOVE) ×2 IMPLANT
GLOVE BIO SURGEON STRL SZ7.5 (GLOVE) ×4 IMPLANT
GLOVE BIOGEL PI IND STRL 7.0 (GLOVE) ×3 IMPLANT
GLOVE BIOGEL PI IND STRL 8 (GLOVE) ×2 IMPLANT
GLOVE BIOGEL PI INDICATOR 7.0 (GLOVE) ×3
GLOVE BIOGEL PI INDICATOR 8 (GLOVE) ×2
GLOVE BIOGEL PI ORTHO PRO SZ8 (GLOVE) ×1
GLOVE PI ORTHO PRO STRL SZ8 (GLOVE) ×1 IMPLANT
GLOVE SURG ORTHO 8.0 STRL STRW (GLOVE) ×2 IMPLANT
GLOVE SURG SS PI 8.0 STRL IVOR (GLOVE) ×2 IMPLANT
GOWN STRL REUS W/ TWL LRG LVL3 (GOWN DISPOSABLE) IMPLANT
GOWN STRL REUS W/ TWL XL LVL3 (GOWN DISPOSABLE) ×3 IMPLANT
GOWN STRL REUS W/TWL 2XL LVL3 (GOWN DISPOSABLE) ×2 IMPLANT
GOWN STRL REUS W/TWL LRG LVL3 (GOWN DISPOSABLE)
GOWN STRL REUS W/TWL XL LVL3 (GOWN DISPOSABLE) ×3
HANDPIECE INTERPULSE COAX TIP (DISPOSABLE) ×1
IMMOBILIZER KNEE 22 UNIV (SOFTGOODS) ×2 IMPLANT
IMMOBILIZER KNEE 24 THIGH 36 (MISCELLANEOUS) IMPLANT
IMMOBILIZER KNEE 24 UNIV (MISCELLANEOUS)
KIT BASIN OR (CUSTOM PROCEDURE TRAY) ×2 IMPLANT
KIT ROOM TURNOVER OR (KITS) ×2 IMPLANT
KNEE/VIT E POLY LINER LEVEL 1B ×2 IMPLANT
MANIFOLD NEPTUNE II (INSTRUMENTS) ×2 IMPLANT
NEEDLE 18GX1X1/2 (RX/OR ONLY) (NEEDLE) ×2 IMPLANT
NEEDLE HYPO 25GX1X1/2 BEV (NEEDLE) ×2 IMPLANT
NS IRRIG 1000ML POUR BTL (IV SOLUTION) ×2 IMPLANT
PACK TOTAL JOINT (CUSTOM PROCEDURE TRAY) ×2 IMPLANT
PAD ABD 8X10 STRL (GAUZE/BANDAGES/DRESSINGS) ×2 IMPLANT
PAD ARMBOARD 7.5X6 YLW CONV (MISCELLANEOUS) ×2 IMPLANT
PAD CAST 4YDX4 CTTN HI CHSV (CAST SUPPLIES) ×1 IMPLANT
PADDING CAST COTTON 4X4 STRL (CAST SUPPLIES) ×1
PADDING CAST COTTON 6X4 STRL (CAST SUPPLIES) ×2 IMPLANT
SET HNDPC FAN SPRY TIP SCT (DISPOSABLE) ×1 IMPLANT
SPONGE GAUZE 4X4 12PLY (GAUZE/BANDAGES/DRESSINGS) ×2 IMPLANT
SPONGE GAUZE 4X4 12PLY STER LF (GAUZE/BANDAGES/DRESSINGS) ×2 IMPLANT
STAPLER VISISTAT 35W (STAPLE) IMPLANT
STRIP CLOSURE SKIN 1/2X4 (GAUZE/BANDAGES/DRESSINGS) ×2 IMPLANT
SUCTION FRAZIER TIP 10 FR DISP (SUCTIONS) ×2 IMPLANT
SUT MNCRL AB 4-0 PS2 18 (SUTURE) ×2 IMPLANT
SUT MON AB 2-0 CT1 27 (SUTURE) ×2 IMPLANT
SUT VIC AB 1 CTX 36 (SUTURE) ×2
SUT VIC AB 1 CTX36XBRD ANBCTR (SUTURE) ×2 IMPLANT
SUT VICRYL 0 CT 1 36IN (SUTURE) ×2 IMPLANT
SYR 50ML LL SCALE MARK (SYRINGE) ×2 IMPLANT
SYR CONTROL 10ML LL (SYRINGE) ×2 IMPLANT
TOWEL OR 17X24 6PK STRL BLUE (TOWEL DISPOSABLE) ×2 IMPLANT
TOWEL OR 17X26 10 PK STRL BLUE (TOWEL DISPOSABLE) ×2 IMPLANT
TRAY FOLEY BAG SILVER LF 14FR (CATHETERS) ×2 IMPLANT
WATER STERILE IRR 1000ML POUR (IV SOLUTION) IMPLANT

## 2013-11-09 NOTE — Progress Notes (Signed)
Orthopedic Tech Progress Note Patient Details:  Geralynn OchsBonnie L Mauceri [birth year] [id number] CPM applied to Right LE with appropriate settings. OHF applied to bed. Footsie roll provided. CPM Right Knee CPM Right Knee: On Right Knee Flexion (Degrees): 60 Right Knee Extension (Degrees): 0   Asia R Thompson 11/09/2013, 4:44 PM

## 2013-11-09 NOTE — Interval H&P Note (Signed)
History and Physical Interval Note:  11/09/2013 8:10 AM  Anne OchsBonnie L Yacoub  has presented today for surgery, with the diagnosis of OA RIGHT KNEE  The various methods of treatment have been discussed with the patient and family. After consideration of risks, benefits and other options for treatment, the patient has consented to  Procedure(s): RIGHT TOTAL KNEE ARTHROPLASTY (Right) as a surgical intervention .  The patient's history has been reviewed, patient examined, no change in status, stable for surgery.  I have reviewed the patient's chart and labs.  Questions were answered to the patient's satisfaction.     MURPHY, TIMOTHY, D

## 2013-11-09 NOTE — Preoperative (Signed)
Beta Blockers   Reason not to administer Beta Blockers:Not Applicable 

## 2013-11-09 NOTE — Op Note (Signed)
DATE OF SURGERY:  11/09/2013 TIME: 2:50 PM  PATIENT NAME:  [name]   AGE: 53 y.o.    PRE-OPERATIVE DIAGNOSIS:  OA RIGHT KNEE  POST-OPERATIVE DIAGNOSIS:  Same  PROCEDURE:  Procedure(s): RIGHT TOTAL KNEE ARTHROPLASTY   SURGEON:  Margarita RanaMURPHY, Meenakshi Sazama, D, MD   ASSISTANT:  Janace LittenBrandon Parry OPA   OPERATIVE IMPLANTS: Stryker Triathlon Posterior Stabilized.  Femur size 4, Tibia size 5, Patella size 29 3-peg oval button, with a 9 mm polyethylene insert.   PREOPERATIVE INDICATIONS:  Anne OchsBonnie L Surprenant is a 53 y.o. year old female with end stage bone on bone degenerative arthritis of the knee who failed conservative treatment, including injections, antiinflammatories, activity modification, and assistive devices, and had significant impairment of their activities of daily living, and elected for Total Knee Arthroplasty.   The risks, benefits, and alternatives were discussed at length including but not limited to the risks of infection, bleeding, nerve injury, stiffness, blood clots, the need for revision surgery, cardiopulmonary complications, among others, and they were willing to proceed.   OPERATIVE DESCRIPTION:  The patient was brought to the operative room and placed in a supine position.  General anesthesia was administered.  IV antibiotics were given.  The lower extremity was prepped and draped in the usual sterile fashion.  Time out was performed.  The leg was elevated and exsanguinated and the tourniquet was inflated.  Anterior approach was performed.  The patella was everted and osteophytes were removed.  The anterior horn of the medial and lateral meniscus was removed.   The distal femur was opened with the drill and the intramedullary distal femoral cutting jig was utilized, set at 5 degrees resecting 8 mm off the distal femur.  Care was taken to protect the collateral ligaments.  The distal femoral sizing jig was applied, taking care to avoid notching.  Then the 4-in-1 cutting  jig was applied and the anterior and posterior femur was cut, along with the chamfer cuts.  All posterior osteophytes were removed.  The flexion gap was then measured and was symmetric with the extension gap.  Then the extramedullary tibial cutting jig was utilized making the appropriate cut using the anterior tibial crest as a reference building in appropriate posterior slope.  Care was taken during the cut to protect the medial and collateral ligaments.  The proximal tibia was removed along with the posterior horns of the menisci.  The PCL was sacrificed.    The extensor gap was measured and was approximately 9mm.    I completed the distal femoral preparation using the appropriate jig to prepare the box.  The patella was then measured, and cut with the saw.    The proximal tibia sized and prepared accordingly with the reamer and the punch, and then all components were trialed with the 9mm poly insert.  The knee was found to have excellent balance and full motion.    The above named components were then cemented into place and all excess cement was removed.  The real polyethylene implant was placed.  Exparel was injected in the soft tissue  The knee was easily taken through a range of motion and the patella tracked well and the knee irrigated copiously and the parapatellar and subcutaneous tissue closed with vicryl, and monocryl with steri strips for the skin.  The wounds were injected with expaell, and dressed with sterile gauze and the tourniquet released and the patient was awakened and returned to the PACU in stable and satisfactory condition.  There  were no complications.  Total tourniquet time was roughly 75 minutes.   POSTOPERATIVE PLAN: post op Abx, DVT px: ASA 325mg

## 2013-11-09 NOTE — Discharge Instructions (Signed)
Keep dressings clean and dry  Take ASA 325 daily for 30 days

## 2013-11-09 NOTE — Anesthesia Preprocedure Evaluation (Addendum)
Anesthesia Evaluation  Patient identified by MRN, date of birth, ID band Patient awake    Reviewed: Allergy & Precautions, H&P , NPO status , Patient's Chart, lab work & pertinent test results  History of Anesthesia Complications (+) PONV and history of anesthetic complications  Airway Mallampati: II TM Distance: >3 FB Neck ROM: Full    Dental  (+) Teeth Intact, Dental Advisory Given   Pulmonary sleep apnea ,  Pt no longer wears C-Pap @ HS   Pulmonary exam normal       Cardiovascular Exercise Tolerance: Poor hypertension, Pt. on medications Rhythm:Regular Rate:Normal  07-Sep-2013 Normal sinus rhythm Possible Left atrial enlargement Borderline ECG   Neuro/Psych Chronic back pain negative neurological ROS  negative psych ROS   GI/Hepatic Neg liver ROS, GERD-  Medicated and Controlled,  Endo/Other  Morbid obesity  Renal/GU negative Renal ROS     Musculoskeletal  (+) Arthritis -, Osteoarthritis,    Abdominal   Peds  Hematology   Anesthesia Other Findings   Reproductive/Obstetrics                      Anesthesia Physical Anesthesia Plan  ASA: III  Anesthesia Plan: General   Post-op Pain Management:    Induction: Intravenous  Airway Management Planned: Oral ETT  Additional Equipment:   Intra-op Plan:   Post-operative Plan: Extubation in OR  Informed Consent: I have reviewed the patients History and Physical, chart, labs and discussed the procedure including the risks, benefits and alternatives for the proposed anesthesia with the patient or authorized representative who has indicated his/her understanding and acceptance.   Dental advisory given  Plan Discussed with: CRNA, Anesthesiologist and Surgeon  Anesthesia Plan Comments:        Anesthesia Quick Evaluation

## 2013-11-09 NOTE — Anesthesia Procedure Notes (Addendum)
Anesthesia Regional Block:  Femoral nerve block  Pre-Anesthetic Checklist: ,, timeout performed, Correct Patient, Correct Site, Correct Laterality, Correct Procedure, Correct Position, site marked, Risks and benefits discussed,  Surgical consent,  Pre-op evaluation,  At surgeon's request and post-op pain management  Laterality: Right  Prep: chloraprep       Needles:  Injection technique: Single-shot  Needle Type: Echogenic Stimulator Needle     Needle Length: 5cm 5 cm Needle Gauge: 22 and 22 G    Additional Needles:  Procedures: ultrasound guided (picture in chart) and nerve stimulator Femoral nerve block  Nerve Stimulator or Paresthesia:  Response: quadraceps contraction, 0.45 mA,   Additional Responses:   Narrative:  Start time: 11/09/2013 12:24 PM End time: 11/09/2013 12:34 PM Injection made incrementally with aspirations every 5 mL.  Performed by: Personally  Anesthesiologist: Halford DecampJ. Daniel Singer, MD  Additional Notes: Functioning IV was confirmed and monitors were applied.  A 50mm 22ga Arrow echogenic stimulator needle was used. Sterile prep and drape,hand hygiene and sterile gloves were used. Ultrasound guidance: relevant anatomy identified, needle position confirmed, local anesthetic spread visualized around nerve(s)., vascular puncture avoided.  Image printed for medical record. Negative aspiration and negative test dose prior to incremental administration of local anesthetic. The patient tolerated the procedure well.     Procedure Name: Intubation Date/Time: 11/09/2013 1:10 PM Performed by: Tyrone NineSAUVE, Minetta Krisher F Pre-anesthesia Checklist: Emergency Drugs available, Patient identified, Timeout performed, Suction available and Patient being monitored Patient Re-evaluated:Patient Re-evaluated prior to inductionOxygen Delivery Method: Circle system utilized Preoxygenation: Pre-oxygenation with 100% oxygen Intubation Type: IV induction Ventilation: Mask ventilation without  difficulty and Oral airway inserted - appropriate to patient size Laryngoscope Size: Hyacinth MeekerMiller and 2 Grade View: Grade III Tube type: Oral Tube size: 7.5 mm Number of attempts: 2 (Laryngoscopy X 2) Airway Equipment and Method: Stylet Placement Confirmation: ETT inserted through vocal cords under direct vision,  breath sounds checked- equal and bilateral and positive ETCO2 Secured at: 23 cm Tube secured with: Tape Dental Injury: Teeth and Oropharynx as per pre-operative assessment  Difficulty Due To: Difficult Airway- due to large tongue

## 2013-11-09 NOTE — Transfer of Care (Signed)
Immediate Anesthesia Transfer of Care Note  Patient: [name]  Procedure(s) Performed: Procedure(s): RIGHT TOTAL KNEE ARTHROPLASTY (Right)  Patient Location: PACU  Anesthesia Type:General  Level of Consciousness: awake, alert , oriented and patient cooperative  Airway & Oxygen Therapy: Patient Spontanous Breathing and Patient connected to nasal cannula oxygen  Post-op Assessment: Report given to PACU RN and Post -op Vital signs reviewed and stable  Post vital signs: Reviewed and stable  Complications: No apparent anesthesia complications

## 2013-11-09 NOTE — Progress Notes (Signed)
Pt cont to have elevated pain 8/10 with current floor medications, Dr.Murphy notified, new orders rec'd, will cont pain meds and monitor for pain

## 2013-11-09 NOTE — Anesthesia Postprocedure Evaluation (Signed)
  Anesthesia Post-op Note  Patient: Anne OchsBonnie L Hayashida  Procedure(s) Performed: Procedure(s): RIGHT TOTAL KNEE ARTHROPLASTY (Right)  Patient Location: PACU  Anesthesia Type:General and GA combined with regional for post-op pain  Level of Consciousness: awake, alert , oriented and patient cooperative  Airway and Oxygen Therapy: Patient Spontanous Breathing  Post-op Pain: mild  Post-op Assessment: Post-op Vital signs reviewed, Patient's Cardiovascular Status Stable, Respiratory Function Stable, Patent Airway, No signs of Nausea or vomiting and Pain level controlled  Post-op Vital Signs: stable  Complications: No apparent anesthesia complications

## 2013-11-10 ENCOUNTER — Inpatient Hospital Stay (HOSPITAL_COMMUNITY): Payer: Managed Care, Other (non HMO)

## 2013-11-10 ENCOUNTER — Encounter (HOSPITAL_COMMUNITY): Payer: Self-pay | Admitting: Orthopedic Surgery

## 2013-11-10 LAB — CBC
HCT: 33.9 % — ABNORMAL LOW (ref 36.0–46.0)
Hemoglobin: 11.2 g/dL — ABNORMAL LOW (ref 12.0–15.0)
MCH: 30.5 pg (ref 26.0–34.0)
MCHC: 33 g/dL (ref 30.0–36.0)
MCV: 92.4 fL (ref 78.0–100.0)
Platelets: 240 10*3/uL (ref 150–400)
RBC: 3.67 MIL/uL — ABNORMAL LOW (ref 3.87–5.11)
RDW: 14 % (ref 11.5–15.5)
WBC: 10.7 10*3/uL — ABNORMAL HIGH (ref 4.0–10.5)

## 2013-11-10 NOTE — Care Management Note (Signed)
CARE MANAGEMENT NOTE 11/10/2013  Patient:  [name],[name]   Account Number:  [id number]  Date Initiated:  11/10/2013  Documentation initiated by:  [name],[name]  Subjective/Objective Assessment:   53 yr old female s/p right total knee arthroplasty.     Action/Plan:   Case manager spoke with patient concering home health and DME needs. Patient preoperatively setup with [facility], no changes. Patient has DME. Has family support at discharge.   Anticipated DC Date:  11/12/2013   Anticipated DC Plan:  HOME W HOME HEALTH SERVICES      DC Planning Services  CM consult      [facility]  DURABLE MEDICAL EQUIPMENT   Choice offered to / List presented to:  C-1 Patient   DME arranged  CPM  WALKER - ROLLING  3-N-1      DME agency  [employer]     HH arranged  HH-2 PT      HH agency  [facility]   Status of service:  In process, will continue to follow Medicare Important Message given?

## 2013-11-10 NOTE — Progress Notes (Signed)
Physical Therapy Treatment Patient Details Name: [name] MRN: [id number] DOB: 10/09/1960 Today's Date: 11/10/2013    History of Present Illness pt presents with R TKA and recent L TKA.      PT Comments    Pt tolerated treatment well ambulating up to 40 feet min guard with R knee immobilizer in place. Pt will benefit from continued acute PT services. Will follow up with pt in AM to increase endurance and strength with gait training and therapeutic exercises focusing on improved functional mobility.   Follow Up Recommendations  Home health PT;Supervision - Intermittent     Equipment Recommendations  None recommended by PT    Recommendations for Other Services       Precautions / Restrictions Precautions Precautions: Fall;Knee Required Braces or Orthoses: Knee Immobilizer - Right Knee Immobilizer - Right:  (No order written, but in room.  ) Restrictions Weight Bearing Restrictions: Yes RLE Weight Bearing: Weight bearing as tolerated    Mobility  Bed Mobility Overal bed mobility: Needs Assistance Bed Mobility: Supine to Sit     Supine to sit: Min assist;HOB elevated     General bed mobility comments: Assist for RLE off of bed only. pt did utilize bed rail.    Transfers Overall transfer level: Needs assistance Equipment used: Rolling walker (2 wheeled) Transfers: Sit to/from Stand Sit to Stand: Min assist         General transfer comment: Verbal cues for hand placement, Min assist to rise from elevated bed  Ambulation/Gait Ambulation/Gait assistance: Min guard Ambulation Distance (Feet): 40 Feet Assistive device: Rolling walker (2 wheeled) Gait Pattern/deviations: Step-through pattern;Decreased stance time - left;Antalgic;Trunk flexed     General Gait Details: Ambulates slowly with knee immobilizer in place on R, but no evidence of buckling during this bout. Pt verbalizes understanding of R knee extension and quad activation during stance phase. Verbal  cues for upright posture   Stairs            Wheelchair Mobility    Modified Rankin (Stroke Patients Only)       Balance                                    Cognition Arousal/Alertness: Awake/alert Behavior During Therapy: WFL for tasks assessed/performed Overall Cognitive Status: Within Functional Limits for tasks assessed                      Exercises      General Comments        Pertinent Vitals/Pain Pain stated at 7/10 - States nurse has recently administered pain medication Nurse aware Pt back in bed for comfort with R knee positioned for optimal extension in Zero-Knee.    Home Living                      Prior Function            PT Goals (current goals can now be found in the care plan section) Acute Rehab PT Goals Patient Stated Goal: Back to work PT Goal Formulation: With patient Time For Goal Achievement: 11/17/13 Potential to Achieve Goals: Good Progress towards PT goals: Progressing toward goals    Frequency  7X/week    PT Plan Current plan remains appropriate    End of Session Equipment Utilized During Treatment: Gait belt;Right knee immobilizer Activity Tolerance: Patient tolerated treatment well Patient left:  with call bell/phone within reach;in bed;with family/visitor present     Time: [id number] PT Time Calculation (min): 11 min  Charges:  $Gait Training: 8-22 mins                    G Codes:      Charlsie MerlesLogan Secor Tatayana Beshears, [place]PT [phone]  Berton MountBarbour, Frayda Egley S 11/10/2013, 2:01 PM

## 2013-11-10 NOTE — Progress Notes (Signed)
[name], female   DOB: [birth year], 53 y.o.   MRN: [id number]     Subjective:  Patient reports pain as mild to moderate.  Patient had a fall while bathing this AM.  She did not land on he knee but did say that that he knee had bent further than comfortable   Objective:   VITALS:   Filed Vitals:   11/10/13 0151 11/10/13 0400 11/10/13 0655 11/10/13 0800  BP: 133/54  124/59   Pulse: 107  98   Temp: 99.3 F (37.4 C)  99 F (37.2 C)   TempSrc: Oral  Oral   Resp: 18 16 18 18   Weight:      SpO2: 98% 98% 99%     ABD soft Sensation intact distally Dorsiflexion/Plantar flexion intact Incision: dressing C/D/I and scant drainage Wound may be gapped open slightly at the proximal portion of the incision  Steri-strips in place   Lab Results  Component Value Date   WBC 10.7* 11/10/2013   HGB 11.2* 11/10/2013   HCT 33.9* 11/10/2013   MCV 92.4 11/10/2013   PLT 240 11/10/2013     Assessment/Plan: 1 Day Post-Op   Active Problems:   Arthritis of knee   Advance diet Up with therapy Plan for X-ray of right knee  WBAT Dry dressing PRN Plan for DC tomorrow or Thurs.   Haskel KhanDOUGLAS Juan Olthoff 11/10/2013, 1:11 PM   Anne Ranaimothy Murphy MD [phone]

## 2013-11-10 NOTE — Evaluation (Signed)
Physical Therapy Evaluation Patient Details Name: Anne OchsBonnie L [name] MRN: [id number] DOB: [birth year] Today's Date: 11/10/2013 Time: [id number] PT Time Calculation (min): 30 min  PT Assessment / Plan / Recommendation History of Present Illness  pt presents with R TKA and recent L TKA.    Clinical Impression  Pt very motivated and just had a L TKA 5 weeks ago.  Pt has all needed DME and will have god family support.  Will continue to follow.      PT Assessment  Patient needs continued PT services    Follow Up Recommendations  Home health PT;Supervision - Intermittent    Does the patient have the potential to tolerate intense rehabilitation      Barriers to Discharge        Equipment Recommendations  None recommended by PT    Recommendations for Other Services     Frequency 7X/week    Precautions / Restrictions Precautions Precautions: Fall;Knee Precaution Booklet Issued: Yes (comment) Required Braces or Orthoses: Knee Immobilizer - Right Knee Immobilizer - Right:  (No order written, but in room.  ) Restrictions Weight Bearing Restrictions: Yes RLE Weight Bearing: Weight bearing as tolerated   Pertinent Vitals/Pain 4/10 during activity.  Premedicated.        Mobility  Bed Mobility Overal bed mobility: Needs Assistance Bed Mobility: Supine to Sit Supine to sit: Min assist;HOB elevated General bed mobility comments: A with R LE only.  pt did utilize bed rail.   Transfers Overall transfer level: Needs assistance Equipment used: Rolling walker (2 wheeled) Transfers: Sit to/from Stand Sit to Stand: Min assist General transfer comment: cues for UE use and positioning of LEs and controlling descent to sitting Ambulation/Gait Ambulation/Gait assistance: Min guard Ambulation Distance (Feet): 40 Feet Assistive device: Rolling walker (2 wheeled) Gait Pattern/deviations: Step-to pattern;Decreased step length - left;Decreased stance time - right General Gait Details: pt with  decreased strength and sensation in R LE 2/2 nerve block.  cues for gait sequencing.      Exercises Total Joint Exercises Ankle Circles/Pumps: AROM;Both;10 reps Quad Sets: AROM;Both;10 reps Long Arc Quad: AAROM;Right;10 reps Knee Flexion: AAROM;Right;10 reps   PT Diagnosis: Abnormality of gait;Acute pain  PT Problem List: Decreased strength;Decreased range of motion;Decreased activity tolerance;Decreased balance;Decreased mobility;Decreased coordination;Decreased knowledge of use of DME;Decreased knowledge of precautions;Pain PT Treatment Interventions: DME instruction;Gait training;Stair training;Functional mobility training;Therapeutic activities;Therapeutic exercise;Balance training;Patient/family education     PT Goals(Current goals can be found in the care plan section) Acute Rehab PT Goals Patient Stated Goal: Back to work PT Goal Formulation: With patient Time For Goal Achievement: 11/17/13 Potential to Achieve Goals: Good  Visit Information  Last PT Received On: 11/10/13 Assistance Needed: +1 History of Present Illness: pt presents with R TKA and recent L TKA.         Prior Functioning  Home Living Family/patient expects to be discharged to:: Private residence Living Arrangements: Spouse/significant other Available Help at Discharge: Family;Available 24 hours/day Type of Home: House Home Access: Stairs to enter Entergy CorporationEntrance Stairs-Number of Steps: 1 Entrance Stairs-Rails: None Home Layout: One level Home Equipment: Walker - 2 wheels;Bedside commode;Shower seat Prior Function Level of Independence: Independent with assistive device(s) Comments: pt had L TKA 5 weeks ago and has been making good progress.   Communication Communication: No difficulties Dominant Hand: Right    Cognition  Cognition Arousal/Alertness: Awake/alert Behavior During Therapy: WFL for tasks assessed/performed Overall Cognitive Status: Within Functional Limits for tasks assessed     Extremity/Trunk Assessment Upper Extremity Assessment Upper  Extremity Assessment: Defer to OT evaluation Lower Extremity Assessment Lower Extremity Assessment: RLE deficits/detail RLE Deficits / Details: Nerve block still causing decreased sensation and strength.  PROM ~10 - 60 RLE Sensation: decreased light touch RLE Coordination: decreased fine motor;decreased gross motor   Balance    End of Session PT - End of Session Equipment Utilized During Treatment: Gait belt;Right knee immobilizer Activity Tolerance: Patient tolerated treatment well Patient left: in chair;with call bell/phone within reach Nurse Communication: Mobility status CPM Right Knee CPM Right Knee: Off  GP     Sunny Schlein, [place] [phone] 11/10/2013, 9:36 AM

## 2013-11-10 NOTE — Progress Notes (Signed)
Orthopedic Tech Progress Note Patient Details:  Anne OchsBonnie L [name] 1960/11/30 [id number] CPM applied to Right LE for 1500 rounds CPM Right Knee CPM Right Knee: On Right Knee Flexion (Degrees): 60 Right Knee Extension (Degrees): 0   Asia R Thompson 11/10/2013, 2:55 PM

## 2013-11-10 NOTE — Progress Notes (Signed)
Utilization review completed.  

## 2013-11-10 NOTE — Progress Notes (Signed)
Orthopedic Tech Progress Note Patient Details:  [name] 09-Feb-1961 [id number] On cpm at 7:55 pm RLE 0-70  [name], female   DOB: 09-Feb-1961, 53 y.o.   MRN: [id number]   [name], [name] 11/10/2013, 7:53 PM

## 2013-11-11 MED ORDER — POLYETHYLENE GLYCOL 3350 17 G PO PACK
17.0000 g | PACK | Freq: Every day | ORAL | Status: DC
  Administered 2013-11-11 – 2013-11-12 (×2): 17 g via ORAL
  Filled 2013-11-11 (×4): qty 1

## 2013-11-11 NOTE — Progress Notes (Signed)
OT Cancellation Note  Patient Details Name: Geralynn OchsBonnie L Chain MRN: [id number] DOB: 1961/04/05   Cancelled Treatment:    Reason Eval/Treat Not Completed: OT screened, no needs identified, will sign off. Pt had recent L TKA. Pt has necessary DME and AE and understands use and techniques for safe completion of BADLs with knee precautions.  Raynald KempKathryn Suleyman Ehrman OTR/L Pager: [phone]  11/11/2013, 4:26 PM

## 2013-11-11 NOTE — Progress Notes (Signed)
Orthopedic Tech Progress Note Patient Details:  Anne OchsBonnie L Gilbert 1961-01-20 [id number] On cpm at 8:05 pm RLE 0-75 Patient ID: Anne OchsBonnie L Castoro, female   DOB: 1961-01-20, 53 y.o.   MRN: [id number]   Jennye MoccasinHughes, Berdell Hostetler Craig 11/11/2013, 8:05 PM

## 2013-11-11 NOTE — Progress Notes (Signed)
I participated in the care of this patient and agree with the above history, physical and evaluation. I performed a review of the history and a physical exam as detailed   Timothy Daniel Murphy MD  

## 2013-11-11 NOTE — Progress Notes (Signed)
Physical Therapy Treatment Patient Details Name: Anne OchsBonnie L Bondy MRN: [id number] DOB: 1961-02-10 Today's Date: 11/11/2013    History of Present Illness pt presents with R TKA and recent L TKA.      PT Comments    Pt limited by pain this afternoon, refusing to ambulate with therapy but willing to perform therapeutic exercises for strength and ROM. PT will plan for continued gait training and stair negotiation tomorrow. Pt will continue to benefit from skilled PT services.   Follow Up Recommendations  Home health PT;Supervision - Intermittent     Equipment Recommendations  None recommended by PT    Recommendations for Other Services       Precautions / Restrictions Precautions Precautions: Fall;Knee Required Braces or Orthoses: Knee Immobilizer - Right Restrictions Weight Bearing Restrictions: Yes RLE Weight Bearing: Weight bearing as tolerated    Mobility  Bed Mobility Overal bed mobility: Needs Assistance Bed Mobility: Sit to Supine;Supine to Sit     Supine to sit: Min assist;HOB elevated Sit to supine: Min assist   General bed mobility comments: Min assist for RLE out of/into bed with no knee immobilizer, Pt able to scoot herself up in bed once supine, without assist  Transfers Overall transfer level: Needs assistance Equipment used: Rolling walker (2 wheeled) Transfers: Sit to/from Stand Sit to Stand: Min assist         General transfer comment: Verbal cues for hand placement, Min assist to rise from reclining chair  Ambulation/Gait Ambulation/Gait assistance: Min guard Ambulation Distance (Feet): 60 Feet Assistive device: Rolling walker (2 wheeled) Gait Pattern/deviations: Step-through pattern;Decreased stance time - left;Antalgic     General Gait Details: Pt ambulates very slowly with knee immobilizer in place, and no evidence of knee buckling. Verbal cues for knee extension in stance phase and to activate quad.   Stairs            Wheelchair  Mobility    Modified Rankin (Stroke Patients Only)       Balance                                    Cognition Arousal/Alertness: Awake/alert Behavior During Therapy: WFL for tasks assessed/performed Overall Cognitive Status: Within Functional Limits for tasks assessed                      Exercises Total Joint Exercises Ankle Circles/Pumps: AROM;Both;10 reps Quad Sets: AROM;Both;10 reps;Supine Hip ABduction/ADduction: AAROM;Right;10 reps;Supine Straight Leg Raises: AAROM;Right;10 reps;Supine Long Arc Quad: AAROM;Right;5 reps;Seated Goniometric ROM: 10-75 degrees right knee flexion    General Comments        Pertinent Vitals/Pain 7/10 pain - states nurse recently administered medications but the pain is not resolving. Nurse notified Pt repositioned in bed for comfort - Knee positioned for optimal knee extension in "zero knee."    Home Living                      Prior Function            PT Goals (current goals can now be found in the care plan section) Acute Rehab PT Goals Patient Stated Goal: Back to work PT Goal Formulation: With patient Time For Goal Achievement: 11/17/13 Potential to Achieve Goals: Good Progress towards PT goals: Progressing toward goals    Frequency  7X/week    PT Plan Current plan remains appropriate    End  of Session Equipment Utilized During Treatment: Gait belt;Right knee immobilizer Activity Tolerance: Patient limited by pain Patient left: with call bell/phone within reach;in bed (zero knee in place)     Time: 1610-9604 PT Time Calculation (min): 16 min  Charges:  $Gait Training: 8-22 mins $Therapeutic Exercise: 8-22 mins                    G Codes:      [employer], [place] [phone]  Berton Mount 11/11/2013, 3:52 PM

## 2013-11-11 NOTE — Progress Notes (Signed)
Physical Therapy Treatment Patient Details Name: Geralynn OchsBonnie L Shifflett MRN: [id number] DOB: 11-Jul-1961 Today's Date: 11/11/2013    History of Present Illness pt presents with R TKA and recent L TKA.      PT Comments    Pt progressing well towards PT goals, ambulating up to 60 feet with min guard. Pt will benefit from continued skilled PT to improve independence with functional mobility. Plan to perform stair training this afternoon.  Follow Up Recommendations  Home health PT;Supervision - Intermittent     Equipment Recommendations  None recommended by PT    Recommendations for Other Services       Precautions / Restrictions Precautions Precautions: Fall;Knee Required Braces or Orthoses: Knee Immobilizer - Right Restrictions Weight Bearing Restrictions: Yes RLE Weight Bearing: Weight bearing as tolerated    Mobility  Bed Mobility Overal bed mobility: Needs Assistance Bed Mobility: Sit to Supine       Sit to supine: Min assist   General bed mobility comments: Min assist for RLE into bed   Transfers Overall transfer level: Needs assistance Equipment used: Rolling walker (2 wheeled) Transfers: Sit to/from Stand Sit to Stand: Min assist         General transfer comment: Verbal cues for hand placement, Min assist to rise from reclining chair  Ambulation/Gait Ambulation/Gait assistance: Min guard Ambulation Distance (Feet): 60 Feet Assistive device: Rolling walker (2 wheeled) Gait Pattern/deviations: Step-through pattern;Decreased stance time - left;Antalgic     General Gait Details: Pt ambulates very slowly with knee immobilizer in place, and no evidence of knee buckling. Verbal cues for knee extension in stance phase and to activate quad.   Stairs            Wheelchair Mobility    Modified Rankin (Stroke Patients Only)       Balance                                    Cognition Arousal/Alertness: Awake/alert Behavior During Therapy:  WFL for tasks assessed/performed Overall Cognitive Status: Within Functional Limits for tasks assessed                      Exercises Total Joint Exercises Ankle Circles/Pumps: AROM;Both;10 reps Quad Sets: AROM;Both;10 reps Hip ABduction/ADduction: AAROM;Right;10 reps;Supine Long Arc Quad: AAROM;Right;5 reps;Seated    General Comments        Pertinent Vitals/Pain 7/10 pain - States she was just recently given pain medication prior to therapy Nurse aware Pt repositioned for comfort in bed- CPM in place 0-75 degrees    Home Living                      Prior Function            PT Goals (current goals can now be found in the care plan section) Acute Rehab PT Goals Patient Stated Goal: Back to work PT Goal Formulation: With patient Time For Goal Achievement: 11/17/13 Potential to Achieve Goals: Good Progress towards PT goals: Progressing toward goals    Frequency  7X/week    PT Plan Current plan remains appropriate    End of Session Equipment Utilized During Treatment: Gait belt;Right knee immobilizer Activity Tolerance: Patient tolerated treatment well Patient left: with call bell/phone within reach;in bed;in CPM     Time: 1042-1110 PT Time Calculation (min): 28 min  Charges:  $Gait Training: 8-22 mins $Therapeutic Exercise: 8-22  mins                    G Codes:     Sunday Spillers [place], [place] [phone]  Berton Mount 11/11/2013, 12:06 PM

## 2013-11-11 NOTE — Progress Notes (Signed)
I participated in the care of this patient and agree with the above history, physical and evaluation. I performed a review of the history and a physical exam as detailed   Samentha Perham Daniel Akeisha Lagerquist MD  

## 2013-11-11 NOTE — Progress Notes (Signed)
Patient ID: Anne OchsBonnie L Rodda, female   DOB: 1961-02-14, 53 y.o.   MRN: [id number]     Subjective:  Patient reports pain as mild to moderate.  Patient doing much better and having no effects for the fall yesterday.  Objective:   VITALS:   Filed Vitals:   11/10/13 1430 11/10/13 1600 11/10/13 2023 11/11/13 0553  BP: 119/58  116/40 119/53  Pulse: 101  97 80  Temp: 98.5 F (36.9 C)  98.4 F (36.9 C) 98.7 F (37.1 C)  TempSrc:   Oral Oral  Resp: 16 18 18 18   Weight:      SpO2: 96%  98% 98%    ABD soft Sensation intact distally Dorsiflexion/Plantar flexion intact Incision: dressing C/D/I and no drainage Wound clean dry and no sign of infection.   Lab Results  Component Value Date   WBC 10.7* 11/10/2013   HGB 11.2* 11/10/2013   HCT 33.9* 11/10/2013   MCV 92.4 11/10/2013   PLT 240 11/10/2013     Assessment/Plan: 2 Days Post-Op   Active Problems:   Arthritis of knee   Advance diet Up with therapy Plan for discharge tomorrow WBAT Dressing change PRN   Haskel KhanDOUGLAS Mikesha Migliaccio 11/11/2013, 9:09 AM   Margarita Ranaimothy Murphy MD [phone]

## 2013-11-12 ENCOUNTER — Encounter (HOSPITAL_COMMUNITY): Payer: Self-pay | Admitting: General Practice

## 2013-11-12 MED ORDER — METHOCARBAMOL 500 MG PO TABS
500.0000 mg | ORAL_TABLET | Freq: Three times a day (TID) | ORAL | Status: DC
  Administered 2013-11-13: 500 mg via ORAL
  Filled 2013-11-12 (×4): qty 1

## 2013-11-12 MED ORDER — WHITE PETROLATUM GEL
Status: AC
  Administered 2013-11-12: 0.2
  Filled 2013-11-12: qty 5

## 2013-11-12 NOTE — Progress Notes (Signed)
PT Cancellation Note  Patient Details Name: Geralynn OchsBonnie L Soloway MRN: [id number] DOB: [birth year]   Cancelled Treatment:    Reason Eval/Treat Not Completed: Pain limiting ability to participate;Other (comment) (Pt refuses.)  Pt unwilling to work with therapy this afternoon, states she is having a bad day with pain 7/10 at current with episodes of nausea and vomiting. Discussed the importance of working with therapy and risks of inactivity, but pt states she will have to wait until tomorrow. Pt allowed PT to place zero knee under RLE, as it was not in place when PT entered room. Will follow up in AM with PT before d/c.  [id number] [place] [place], [place] [phone]  [place] Mount[place]bour, Hailynn Slovacek S 11/12/2013, 2:41 PM

## 2013-11-12 NOTE — Progress Notes (Signed)
Patient ID: Anne OchsBonnie L Asencio, female   DOB: 06-29-1961, 53 y.o.   MRN: [id number]     Subjective:  Patient reports pain as mild to moderate.  Patient states that she is in more pain today.  She is sitting up in the chair,  Objective:   VITALS:   Filed Vitals:   11/11/13 1301 11/11/13 2056 11/12/13 0541 11/12/13 1109  BP: 126/57 120/62 127/51   Pulse:  94 93   Temp: 99.1 F (37.3 C) 99.2 F (37.3 C) 99.5 F (37.5 C)   TempSrc: Oral     Resp: 18 18 18    Height:    5\' 7"  (1.702 m)  Weight:    119.296 kg (263 lb)  SpO2: 96% 100% 96%     ABD soft Sensation intact distally Dorsiflexion/Plantar flexion intact Incision: dressing C/D/I and no drainage   Lab Results  Component Value Date   WBC 10.7* 11/10/2013   HGB 11.2* 11/10/2013   HCT 33.9* 11/10/2013   MCV 92.4 11/10/2013   PLT 240 11/10/2013     Assessment/Plan: 3 Days Post-Op   Active Problems:   Arthritis of knee   Advance diet Up with therapy WBAT Dry dressing PRN Plan for DC tomorrow   Haskel KhanDOUGLAS Manha Amato 11/12/2013, 12:00 PM   Margarita Ranaimothy Murphy MD [phone]

## 2013-11-12 NOTE — Progress Notes (Signed)
Orthopedic Tech Progress Note Patient Details:  [name] Pigeon 23-Oct-1960 [id number] On cpm at 3:10 pm RLE 0-70 [name] Bennis, female   DOB: 23-Oct-1960, 53 y.o.   MRN: [id number]   Jennye MoccasinHughes, Kelten Enochs Craig 11/12/2013, 3:10 PM

## 2013-11-12 NOTE — Progress Notes (Signed)
Physical Therapy Treatment Patient Details Name: [name] MRN: [id number] DOB: 11-23-1960 Today'[name] Date: 11/12/2013    History of Present Illness pt presents with R TKA and recent L TKA.      PT Comments    Pt tolerated treatment well this AM, ambulating up to 60 feet with min guard, and completed stair training, demonstrating ability to safely navigate one 4" step similar to home environment. PT will continue to follow until d/c focusing on strength and endurance with gait training and therapeutic exercises in order to increase functional independence.   Follow Up Recommendations  Home health PT;Supervision - Intermittent     Equipment Recommendations  None recommended by PT    Recommendations for Other Services       Precautions / Restrictions Precautions Precautions: Fall;Knee Required Braces or Orthoses: Knee Immobilizer - Right Restrictions Weight Bearing Restrictions: Yes RLE Weight Bearing: Weight bearing as tolerated    Mobility  Bed Mobility Overal bed mobility: Needs Assistance Bed Mobility: Supine to Sit     Supine to sit: Min assist     General bed mobility comments: Pt requires min assist for support of RLE off of bed. HOB flat and no use of rails for simulated home environment  Transfers Overall transfer level: Needs assistance Equipment used: Rolling walker (2 wheeled) Transfers: Sit to/from Stand Sit to Stand: Supervision         General transfer comment: Pt requires extra time, no verbal cues needed, supervision for safety. Bed elevated similar to home environment  Ambulation/Gait Ambulation/Gait assistance: Min guard Ambulation Distance (Feet): 60 Feet Assistive device: Rolling walker (2 wheeled) Gait Pattern/deviations: Step-through pattern;Decreased stance time - right     General Gait Details: Pt ambulates very slowly with knee immobilizer in place, and no evidence of knee buckling. Verbal cues for knee extension in stance phase  and to activate quad. Pt verbalizes understanding to extend knee while in WB.   Stairs Stairs: Yes Stairs assistance: Min guard Stair Management: No rails;Step to pattern;Backwards;With walker Number of Stairs: 1 General stair comments: Pt safely ascends/descends 4" step with min guard. She states this is the same technique she used after her previous knee replacement and feels comfortable navigating steps  Wheelchair Mobility    Modified Rankin (Stroke Patients Only)       Balance                                    Cognition Arousal/Alertness: Awake/alert Behavior During Therapy: WFL for tasks assessed/performed Overall Cognitive Status: Within Functional Limits for tasks assessed                      Exercises Total Joint Exercises Quad Sets: AROM;Both;10 reps Hip ABduction/ADduction: AAROM;Right;10 reps;Supine Straight Leg Raises: AAROM;Right;10 reps;Supine    General Comments        Pertinent Vitals/Pain 8/10 pain  Nurse aware and administered medication during therapy session Pt repositioned for comfort in chair    Home Living                      Prior Function            PT Goals (current goals can now be found in the care plan section) Acute Rehab PT Goals Patient Stated Goal: Back to work PT Goal Formulation: With patient Time For Goal Achievement: 11/17/13 Potential to Achieve Goals: Good Progress  towards PT goals: Progressing toward goals    Frequency  7X/week    PT Plan Current plan remains appropriate    End of Session Equipment Utilized During Treatment: Gait belt;Right knee immobilizer Activity Tolerance: Patient tolerated treatment well Patient left: in chair;with call bell/phone within reach     Time: 0910-0941 PT Time Calculation (min): 31 min  Charges:  $Gait Training: 8-22 mins $Therapeutic Exercise: 8-22 mins                    G Codes:      [name] [place] Areen [name], [place] [phone]  [name],  [name] 11/12/2013, 9:57 AM

## 2013-11-13 NOTE — Progress Notes (Signed)
[name], female   DOB: August 12, 1961, 53 y.o.   MRN: [id number]     Subjective:  Patient reports pain as mild.  Patient states that she is much better and is ready to be at home  Objective:   VITALS:   Filed Vitals:   11/12/13 1430 11/12/13 2000 11/12/13 2005 11/13/13 0545  BP: 132/68  127/55 126/53  Pulse: 88  91 92  Temp: 98.6 F (37 C)  99.1 F (37.3 C) 99.1 F (37.3 C)  TempSrc:   Oral Oral  Resp: 18 18 18 18   Height:      Weight:      SpO2: 98% 99% 99% 98%    ABD soft Sensation intact distally Dorsiflexion/Plantar flexion intact Incision: dressing C/D/I and no drainage   Lab Results  Component Value Date   WBC 10.7* 11/10/2013   HGB 11.2* 11/10/2013   HCT 33.9* 11/10/2013   MCV 92.4 11/10/2013   PLT 240 11/10/2013     Assessment/Plan: 4 Days Post-Op   Active Problems:   Arthritis of knee   Advance diet Up with therapy Discharge home with home health   Haskel KhanDOUGLAS Jackob Crookston 11/13/2013, 7:30 AM   Margarita Ranaimothy Murphy MD [phone]

## 2013-11-13 NOTE — Discharge Summary (Signed)
Physician Discharge Summary  [name] MRN: [id number] DOB/AGE: 1960-11-04 53 y.o.  Admit date: 11/09/2013 Discharge date: 11/13/2013  Admission Diagnoses:  <principal problem not specified>  Discharge Diagnoses:  Active Problems:   Arthritis of knee   Past Medical History  Diagnosis Date  . Hypertension   . GERD (gastroesophageal reflux disease)   . Umbilical hernia   . Arthritis   . UTI (lower urinary tract infection)     recent- unable to finish med only took 2 days  . 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 Resolved with thoracic epidural steroid injection.   Marland Kitchen PONV (postoperative nausea and vomiting)     with pain med  . Sleep apnea     previously no longer neg sleep study  . Fatty liver     Surgeries: Procedure(s): RIGHT TOTAL KNEE ARTHROPLASTY on 11/09/2013   Consultants (if any):    Discharged Condition: Improved  Hospital Course: [name] is an 53 y.o. female who was admitted 11/09/2013 with a diagnosis of <principal problem not specified> and went to the operating room on 11/09/2013 and underwent the above named procedures.    She was given perioperative antibiotics:  Anti-infectives   Start     Dose/Rate Route Frequency Ordered Stop   11/09/13 2130  ceFAZolin (ANCEF) IVPB 2 g/50 mL premix     2 g 100 mL/hr over 30 Minutes Intravenous Every 6 hours 11/09/13 2049 11/10/13 0256   11/09/13 0600  ceFAZolin (ANCEF) IVPB 2 g/50 mL premix     2 g 100 mL/hr over 30 Minutes Intravenous On call to O.R. 11/08/13 1404 11/09/13 1306    .  She was given sequential compression devices, early ambulation, and asa 325 for DVT prophylaxis.  She benefited maximally from the hospital stay and there were no complications.    Recent vital signs:  Filed Vitals:   11/13/13 0545  BP: 126/53  Pulse: 92  Temp: 99.1 F (37.3 C)  Resp: 18    Recent laboratory studies:  Lab Results  Component Value Date   HGB  11.2* 11/10/2013   HGB 12.4 11/02/2013   HGB 11.3* 10/07/2013   Lab Results  Component Value Date   WBC 10.7* 11/10/2013   PLT 240 11/10/2013   Lab Results  Component Value Date   INR 1.14 11/02/2013   Lab Results  Component Value Date   NA 141 11/02/2013   K 4.2 11/02/2013   CL 101 11/02/2013   CO2 25 11/02/2013   BUN 12 11/02/2013   CREATININE 0.75 11/02/2013   GLUCOSE 143* 11/02/2013    Discharge Medications:     Medication List         aspirin EC 325 MG tablet  Take 1 tablet (325 mg total) by mouth daily.     aspirin EC 325 MG tablet  Take 1 tablet (325 mg total) by mouth daily.     cephALEXin 250 MG capsule  Commonly known as:  KEFLEX  Take 1 capsule (250 mg total) by mouth 4 (four) times daily.     docusate sodium 100 MG capsule  Commonly known as:  COLACE  Take 1 capsule (100 mg total) by mouth 2 (two) times daily. Continue this while taking narcotics to help with bowel movements     EPINEPHrine 0.3 mg/0.3 mL Devi  Commonly known as:  EPIPEN  Inject 0.3 mLs (0.3 mg total) into the muscle once.     fluticasone 27.5  MCG/SPRAY nasal spray  Commonly known as:  VERAMYST  Place 1 spray into both nostrils daily as needed for allergies. One squirt  to each nostril     fluticasone 50 MCG/ACT nasal spray  Commonly known as:  FLONASE  Place 1 spray into both nostrils daily as needed for allergies or rhinitis.     lisinopril 10 MG tablet  Commonly known as:  PRINIVIL,ZESTRIL  Take 10 mg by mouth daily.     ondansetron 4 MG tablet  Commonly known as:  ZOFRAN  Take 1 tablet (4 mg total) by mouth every 8 (eight) hours as needed for nausea.     ondansetron 4 MG tablet  Commonly known as:  ZOFRAN  Take 1 tablet (4 mg total) by mouth every 8 (eight) hours as needed for nausea.     OVER THE COUNTER MEDICATION  Take 1 tablet by mouth daily. Hair skin and nails     oxyCODONE 5 MG immediate release tablet  Commonly known as:  ROXICODONE  Take 2 tablets (10 mg total) by  mouth every 4 (four) hours as needed for severe pain.     oxyCODONE 5 MG immediate release tablet  Commonly known as:  ROXICODONE  Take 2 tablets (10 mg total) by mouth every 4 (four) hours as needed for severe pain.        Diagnostic Studies: Dg Knee 1-2 Views Right  11/10/2013   CLINICAL DATA:  Fall.  Prior total knee replacement.  EXAM: RIGHT KNEE - 1-2 VIEW  COMPARISON:  DG KNEE 1-2 VIEWS*R* dated 11/09/2013  FINDINGS: Patient status post total knee replacement. A focus of bone or methylmethacrylate noted along the posterior femur on lateral view. This was not noted on prior exam. A donor site is not identified. The prostheses are in stable position. No prominent fractures noted. Postoperative changes right knee.  IMPRESSION: 1. Patient is status post total right knee replacement. Orthopedic hardware in stable position. No prominent fracture is identified.  2. Focal radiopacity noted along the posterior femur on lateral view. This was not noted on prior study. This could represent a focus of methylmethacrylate or bone chip. A donor site however is not identified.  3. Postoperative changes right knee.   Electronically Signed   By: Maisie Fushomas  Register   On: 11/10/2013 16:03   Dg Knee 1-2 Views Right  11/09/2013   CLINICAL DATA:  Postoperative of right total knee joint replacement.  EXAM: RIGHT KNEE - 1-2 VIEW  COMPARISON:  None.  FINDINGS: The patient has undergone placement of a right knee joint prosthesis. Radiographic positioning of the prosthetic components is good. The interface of the prosthesis with the native bone appears normal. There is a small amount of soft tissue gas anteriorly.  IMPRESSION: The patient has undergone right total knee joint prosthesis placement without evidence of immediate postprocedure complication.   Electronically Signed   By: David  [place]   On: 11/09/2013 17:29    Disposition: 06-Home-Health Care Svc      Discharge Orders   Future Orders Complete By Expires    Weight bearing as tolerated  As directed    Questions:     Laterality:     Extremity:        Follow-up Information   Follow up with Sheral ApleyMURPHY, Dequante Tremaine, D, MD. (as scheduled)    Specialty:  Orthopedic Surgery   Contact information:   [address] [place] [id number] [phone]        Signed:  Bruchy Mikel, D 11/13/2013, 8:53 AM

## 2013-11-13 NOTE — Progress Notes (Signed)
I participated in the care of this patient and agree with the above history, physical and evaluation. I performed a review of the history and a physical exam as detailed   Ama Mcmaster Daniel Edwardo Wojnarowski MD  

## 2013-11-13 NOTE — Progress Notes (Signed)
I participated in the care of this patient and agree with the above history, physical and evaluation. I performed a review of the history and a physical exam as detailed   Deajah Erkkila Daniel Aaminah Forrester MD  

## 2013-11-13 NOTE — Progress Notes (Signed)
Physical Therapy Treatment Patient Details Name: [name] MRN: [id number] DOB: [birth year] Today'[name] Date: 11/13/2013    History of Present Illness pt presents with R TKA and recent L TKA.      PT Comments    Pt progressing well towards physical therapy goals, she is compliant with her exercises and safely follows knee precautions. She will continue to greatly benefit from skilled therapy in HHPT setting to improve strength, ROM, endurance, and general functional mobility. Pt is appropriate for discharge from PT standpoint.   Follow Up Recommendations  Home health PT;Supervision - Intermittent     Equipment Recommendations  None recommended by PT    Recommendations for Other Services       Precautions / Restrictions Precautions Precautions: Fall;Knee Required Braces or Orthoses: Knee Immobilizer - Right Restrictions Weight Bearing Restrictions: Yes RLE Weight Bearing: Weight bearing as tolerated    Mobility  Bed Mobility Overal bed mobility: Needs Assistance Bed Mobility: Supine to Sit     Supine to sit: Min guard ((with knee immobilizer for support))     General bed mobility comments: Pt able to perform supine>sit EOB with min guard while wearing knee immobilizer. Cues for technique  Transfers Overall transfer level: Needs assistance Equipment used: Rolling walker (2 wheeled) Transfers: Sit to/from Stand Sit to Stand: Supervision         General transfer comment: Extra time for sit>stand with RW, no cues for technique needed. Pt safely demonstrates control of RW and pushes up from bed.   Ambulation/Gait Ambulation/Gait assistance: Min guard Ambulation Distance (Feet): 80 Feet Assistive device: Rolling walker (2 wheeled) Gait Pattern/deviations: Step-through pattern;Antalgic Gait velocity: Decreased   General Gait Details: Pt ambulates very slowly with knee immobilizer in place, pt reports she had one instance of knee buckling on R, but knee  immobilizer provides enough control to keep her safely ambulating. Verbal cues for knee extension in stance phase and to activate quad.   Stairs            Wheelchair Mobility    Modified Rankin (Stroke Patients Only)       Balance                                    Cognition Arousal/Alertness: Awake/alert Behavior During Therapy: WFL for tasks assessed/performed Overall Cognitive Status: Within Functional Limits for tasks assessed                      Exercises Total Joint Exercises Ankle Circles/Pumps: AROM;Both;10 reps Quad Sets: AROM;Both;10 reps Short Arc QuadBarbaraann [name];Right;10 reps;Supine Heel Slides: AROM;Right;10 reps;Seated Long Arc Quad: AAROM;Right;10 reps;Seated    General Comments        Pertinent Vitals/Pain 610 pain Nurse aware and present in room at end of therapy Pt repositioned in chair for comfort with knee positioned for optimal extension using Zero Knee    Home Living                      Prior Function            PT Goals (current goals can now be found in the care plan section) Acute Rehab PT Goals Patient Stated Goal: Back to work PT Goal Formulation: With patient Time For Goal Achievement: 11/17/13 Potential to Achieve Goals: Good Progress towards PT goals: Progressing toward goals    Frequency  7X/week    PT  Plan Current plan remains appropriate    End of Session Equipment Utilized During Treatment: Gait belt;Right knee immobilizer Activity Tolerance: Patient tolerated treatment well Patient left: in chair;with call bell/phone within reach;with nursing/sitter in room     Time: 0830-0857 PT Time Calculation (min): 27 min  Charges:  $Gait Training: 8-22 mins $Therapeutic Exercise: 8-22 mins                    G Codes:      [name] [employer] [name] [name], [place] [phone]  Anne MountBarbour, [name] 11/13/2013, 9:43 AM

## 2013-12-17 ENCOUNTER — Ambulatory Visit (INDEPENDENT_AMBULATORY_CARE_PROVIDER_SITE_OTHER): Payer: Managed Care, Other (non HMO) | Admitting: Family Medicine

## 2013-12-17 ENCOUNTER — Encounter: Payer: Self-pay | Admitting: Family Medicine

## 2013-12-17 VITALS — BP 153/87 | HR 84 | Temp 98.3°F | Ht 67.0 in | Wt 257.0 lb

## 2013-12-17 DIAGNOSIS — N62 Hypertrophy of breast: Secondary | ICD-10-CM

## 2013-12-17 NOTE — Progress Notes (Signed)
[name], female   DOB: [birth year], 53 y.o.   MRN: [id number]    Subjective: HPI: Patient is a 53 y.o. female presenting to clinic today for clinic appointment. Concerns today include breast reduction  Breast reduction - Patient has 48J bra size, but has many complications from this including persistent intertrigo, skin tags, shoulder pain, neck pain, infections in axilla. She has met with surgeon concerning reduction and is interested in moving forward.    History Reviewed: Never smoker. Health Maintenance: UTD  ROS: Please see HPI above.  Objective: Office vital signs reviewed. BP 153/87  Pulse 84  Temp(Src) 98.3 F (36.8 C) (Oral)  Ht '5\' 7"'  (1.702 m)  Wt 257 lb (116.574 kg)  BMI 40.24 kg/m2  Physical Examination:  General: Awake, alert. NAD HEENT: Atraumatic, normocephalic. MMM Neck: No masses palpated. No LAD, mild TTP of deltoids Pulm: CTAB, no wheezes Cardio: RRR, no murmurs appreciated Breast: Large pendulous breasts with hyperpigmented shiny skin under breasts bilaterally. NO skin breakdown. Redundant skin in axilla. Abdomen:+BS, soft, nontender, nondistended Extremities: No edema. TTP of shoulders with marks on shoulders from bra Neuro: Grossly intact  Assessment: 53 y.o. female with large breasts interested in breast reduction  Plan: See Problem List and After Visit Summary

## 2013-12-17 NOTE — Patient Instructions (Signed)
We will get you an appointment for evaluation for surgery.  Keep a close eye on your blood pressure, we can restart your blood pressure pill.  Balbina Depace M. Kharis Lapenna, M.D.

## 2013-12-19 DIAGNOSIS — N62 Hypertrophy of breast: Secondary | ICD-10-CM | POA: Insufficient documentation

## 2013-12-19 HISTORY — DX: Hypertrophy of breast: N62

## 2013-12-19 NOTE — Assessment & Plan Note (Signed)
Female with large breasts interfering with her daily life due to pain, intertrigo, weight on shoulders and infections in axilla. I feel she would greatly benefit from a breast reduction and therefore I am referring her to plastic surgery for reduction. Letter [place]. [place] office sent.

## 2014-02-24 ENCOUNTER — Encounter: Payer: Self-pay | Admitting: Family Medicine

## 2014-02-24 ENCOUNTER — Ambulatory Visit (INDEPENDENT_AMBULATORY_CARE_PROVIDER_SITE_OTHER): Payer: Managed Care, Other (non HMO) | Admitting: Family Medicine

## 2014-02-24 VITALS — BP 150/83 | HR 80 | Temp 97.4°F | Wt 257.0 lb

## 2014-02-24 DIAGNOSIS — R42 Dizziness and giddiness: Secondary | ICD-10-CM | POA: Insufficient documentation

## 2014-02-24 HISTORY — DX: Dizziness and giddiness: R42

## 2014-02-24 MED ORDER — MECLIZINE HCL 25 MG PO TABS: 25.0000 mg | ORAL_TABLET | Freq: Three times a day (TID) | ORAL | Status: DC | PRN

## 2014-02-24 NOTE — Patient Instructions (Signed)
I think this is benign positional vertigo. You can take meclizine three times daily as needed. Do exercises described 2-3 times daily. Over time, these should improve symptoms. Seek immediate care if you have chest pain or trouble breathing, or fainting. We are ordering vestibular rehab. If you notice hearing loss, let us know and we will send you for formal hearing testing at audiology.  Best,  Anne SingletonMaria T Mackenize Delgadillo, MD

## 2014-02-24 NOTE — Progress Notes (Signed)
[name], female   DOB: Nov 03, 1960, 53 y.o.   MRN: [id number] Subjective:   CC: dizziness  HPI:   [name] is a 53 year old female with a history of obesity, hypertension, osteo-arthritis, and chronic back pain here with dizziness for 3 days. she reports that she has noticed a spinning sensation when she lays that is "real bad". This has been relatively constant for the past 3 days, much worse when she lays and leans forward. She has been trying to keep her head still and not move her eyes as much. She has also been holding her temples which helps. She has noticed some ringing in her ears on the left greater than right. She has been drinking plenty of water and checking blood pressure to see if that is the cause. [facility] but improved as she rested. She has had some nausea but no vomiting. She has also had a headache and taking naproxen makes her "jittery".She has not had any chest pain, fevers or chills, dyspnea, or other symptoms. she has not had any weakness. She is not certain if she has had hearing loss.  Review of Systems - Per HPI.   Past medical history: This is occurred multiple times over the past 2 years, and is brought on by your infections or other ear problems. She years ago went to a vestibular rehabilitation and this helped.     Objective:  Physical Exam BP 150/83  Pulse 80  Temp(Src) 97.4 F (36.3 C) (Oral)  Wt 257 lb (116.574 kg) Orthostatics: Negative GEN: NAD Cardiovascular: Regular rate and rhythm, no murmurs rubs or gallops Pulmonary: Clear to auscultation bilaterally HEENT: TMs obscured bilaterally by cerumen impaction, oropharynx clear, sclera clear, PERRLA Neuro: No focal deficits, Dix-Hallpike reproduces dizziness but not nystagmus The hearing test in clinic: Patient can hear up to 4000 Hz but required 40 dB bilaterally    Assessment:     [name] is a 53 y.o. female with h/o prior issues with dizziness  episodically here for evaluation of dizziness.    Plan:     Dizziness Differential diagnosis includes BPPV and Mnire's disease. Patient has vertigo, tinnitus on the left, but no reported hearing loss. Orthostatic vital signs are done and are negative. -vestibular rehabilitation exercises reviewed and given to patient -meclizine 25 mg 3 times a day when necessary -ears cleaned out in clinic today and patient reports some relief. -Recommended mineral oil when necessary to keep earwax soft -Vestibular rehabilitation ordered. -Return precautions reviewed. -Formal hearing test if patient reports any hearing loss. -strongly recommended against driving until she feels better.  Follow-up: Follow up in 2 weeks as needed for if symptoms are not improving. -patient needs followup for hypertension as well. Did not discuss this today.    Anne SingletonMaria T Kipling Graser, MD [facility]

## 2014-02-24 NOTE — Assessment & Plan Note (Signed)
Differential diagnosis includes BPPV and Mnire's disease. Patient has vertigo, tinnitus on the left, but no reported hearing loss. Orthostatic vital signs are done and are negative. -vestibular rehabilitation exercises reviewed and given to patient -meclizine 25 mg 3 times a day when necessary -ears cleaned out in clinic today and patient reports some relief. -Recommended mineral oil when necessary to keep earwax soft -Vestibular rehabilitation ordered. -Return precautions reviewed. -Formal hearing test if patient reports any hearing loss. -strongly recommended against driving until she feels better.

## 2014-05-07 ENCOUNTER — Encounter: Payer: Self-pay | Admitting: Gastroenterology

## 2014-05-24 IMAGING — CR DG KNEE 1-2V*R*
2 series · 2 of 2 positions shown · non-contrast
Comparison: DG KNEE 1-2 VIEWS*R* dated 11/09/2013

CLINICAL DATA: Fall.  Prior total knee replacement.

EXAM:
RIGHT KNEE - 1-2 VIEW

[x knee lat right]
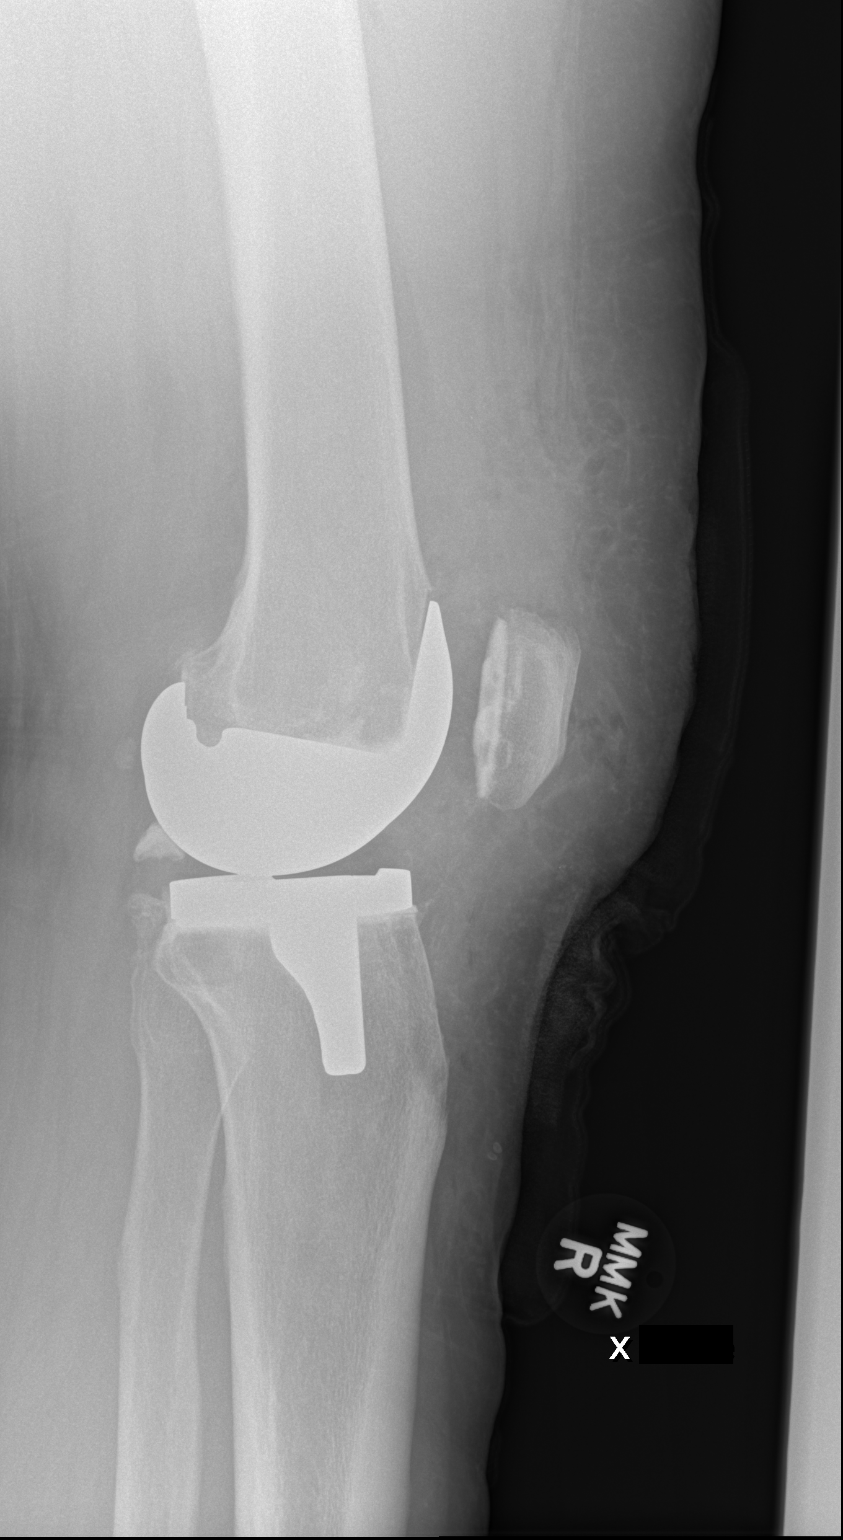

[x knee ap right]
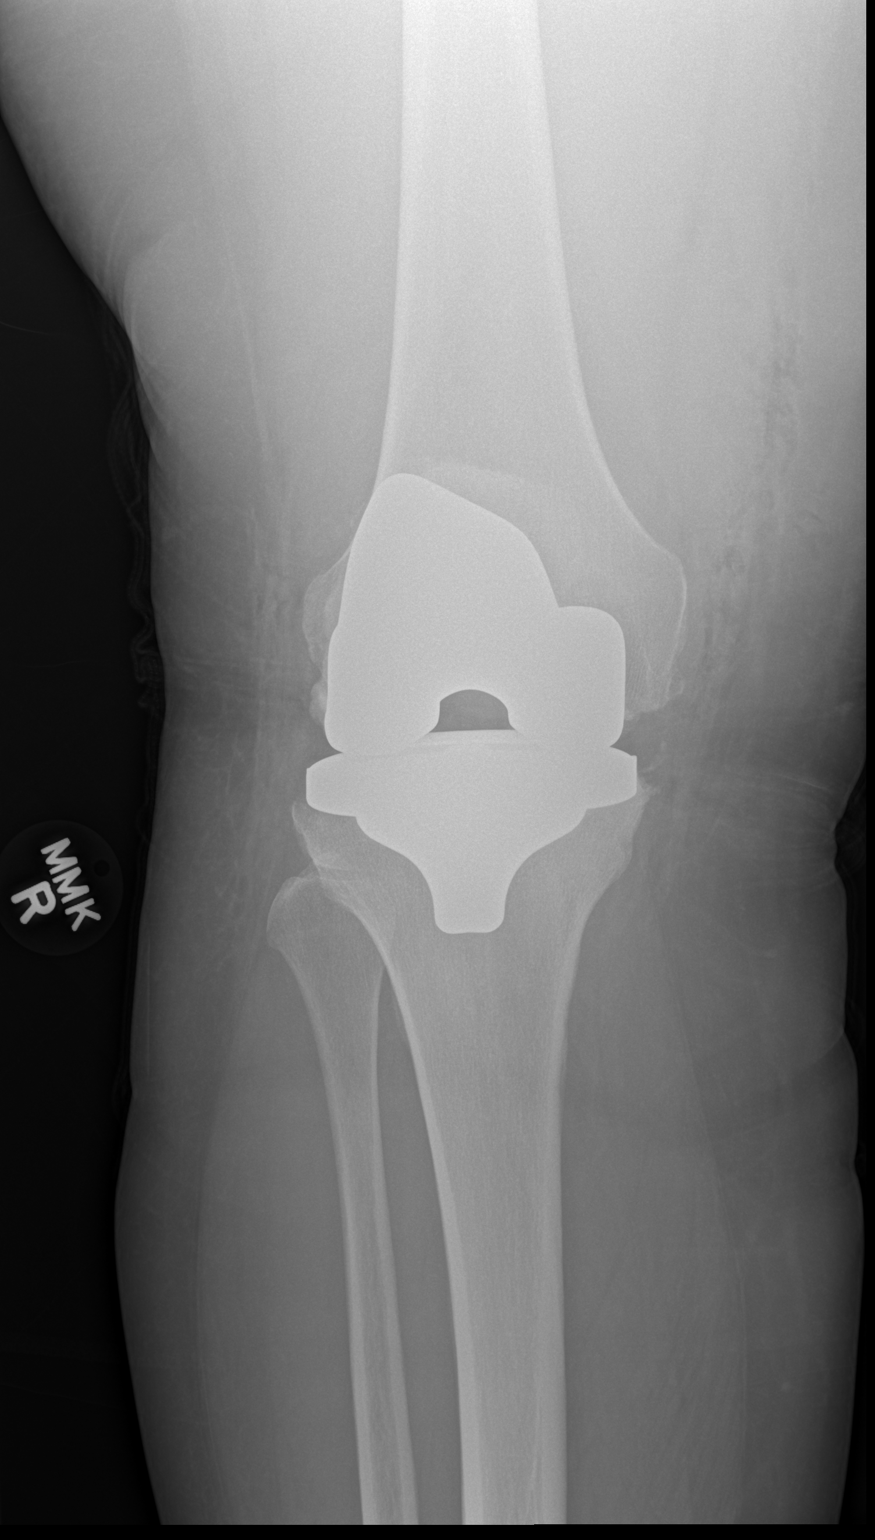

[2 of 2 positions shown; findings below may reference images not displayed]

FINDINGS: Patient status post total knee replacement. A focus of bone or
methylmethacrylate noted along the posterior femur on lateral view.
This was not noted on prior exam. A donor site is not identified.
The prostheses are in stable position. No prominent fractures noted.
Postoperative changes right knee.
IMPRESSION: 1. Patient is status post total right knee replacement. Orthopedic
hardware in stable position. No prominent fracture is identified.

2. Focal radiopacity noted along the posterior femur on lateral
view. This was not noted on prior study. This could represent a
focus of methylmethacrylate or bone chip. A donor site however is
not identified.

3. Postoperative changes right knee.

## 2014-09-08 ENCOUNTER — Ambulatory Visit (INDEPENDENT_AMBULATORY_CARE_PROVIDER_SITE_OTHER): Payer: Self-pay | Admitting: Family Medicine

## 2014-09-08 ENCOUNTER — Encounter: Payer: Self-pay | Admitting: Family Medicine

## 2014-09-08 VITALS — BP 185/72 | HR 80 | Temp 98.3°F | Ht 67.0 in | Wt 287.7 lb

## 2014-09-08 DIAGNOSIS — J019 Acute sinusitis, unspecified: Secondary | ICD-10-CM

## 2014-09-08 MED ORDER — FLUTICASONE PROPIONATE 50 MCG/ACT NA SUSP: 1.0000 | Freq: Every day | NASAL | Status: DC | PRN

## 2014-09-08 MED ORDER — GUAIFENESIN-CODEINE 100-10 MG/5ML PO SOLN: 5.0000 mL | Freq: Three times a day (TID) | ORAL | Status: DC | PRN

## 2014-09-08 MED ORDER — AMOXICILLIN 500 MG PO CAPS: 500.0000 mg | ORAL_CAPSULE | Freq: Three times a day (TID) | ORAL | Status: AC

## 2014-09-08 NOTE — Progress Notes (Signed)
    Subjective   [name] is a 54 y.o. female that presents for a same day visit  1. Cough: Symptoms started about 3 weeks ago with an initial productive sputum which has become non-productive. Associated sneezing and runny nose. She still has some sputum in the morning. Has history of post-nasal drip and allergic rhinitis. She has been using saline sprays. She does not have anymore Flonase. She has headaches after coughing. She has been taking Afrin. No sick contacts. No fevers. Has intermittent chills and sweats.  History  Substance Use Topics  . Smoking status: Never Smoker   . Smokeless tobacco: Never Used  . Alcohol Use: No    ROS Per HPI  Objective   BP 185/72 mmHg  Pulse 80  Temp(Src) 98.3 F (36.8 C) (Oral)  Ht 5\' 7"  (1.702 m)  Wt 287 lb 11.2 oz (130.5 kg)  BMI 45.05 kg/m2  General: Fair appearing, no distress HEENT: PERRL, TMs not visible bilaterally due to cerumen, frontal and maxillary tenderness, nasal turbinates edematous bilaterally, oropharynx clear without tonsillar exudate Respiratory/Chest: clear to auscultation bilaterally, no wheezing  Assessment and Plan   Acute sinusitis: symptoms persistent for last three weeks. No fevers. Vitals stable (BP elevated).   Amoxicillin 500mg  TID x10 days  Flonase 2 sprays each nostril qD  Guaifenesin-codeine cough syrup  Handout given

## 2014-09-08 NOTE — Patient Instructions (Signed)
Thank you for coming to see me today. It was a pleasure. Today we talked about:   Acute rhinosinusitis: I have prescribed an antibiotic for you. Please use this for a total of 10 days. I am also prescribing some cough medication for you. It might be beneficial to use Flonase as this will help with your post nasal drip, which may be contributing. If symptoms fail to improve in the next 1-2 weeks, please return, otherwise, follow-up with Dr. Birdie SonsSonnenberg as directed by him.   If you have any questions or concerns, please do not hesitate to call the office at [phone].  Sincerely,  Jacquelin Hawkingalph Ashyah Quizon, MD  Sinusitis Sinusitis is redness, soreness, and inflammation of the paranasal sinuses. Paranasal sinuses are air pockets within the bones of your face (beneath the eyes, the middle of the forehead, or above the eyes). In healthy paranasal sinuses, mucus is able to drain out, and air is able to circulate through them by way of your nose. However, when your paranasal sinuses are inflamed, mucus and air can become trapped. This can allow bacteria and other germs to grow and cause infection. Sinusitis can develop quickly and last only a short time (acute) or continue over a long period (chronic). Sinusitis that lasts for more than 12 weeks is considered chronic.  CAUSES  Causes of sinusitis include:  Allergies.  Structural abnormalities, such as displacement of the cartilage that separates your nostrils (deviated septum), which can decrease the air flow through your nose and sinuses and affect sinus drainage.  Functional abnormalities, such as when the small hairs (cilia) that line your sinuses and help remove mucus do not work properly or are not present. SIGNS AND SYMPTOMS  Symptoms of acute and chronic sinusitis are the same. The primary symptoms are pain and pressure around the affected sinuses. Other symptoms include:  Upper toothache.  Earache.  Headache.  Bad breath.  Decreased sense of  smell and taste.  A cough, which worsens when you are lying flat.  Fatigue.  Fever.  Thick drainage from your nose, which often is green and may contain pus (purulent).  Swelling and warmth over the affected sinuses. DIAGNOSIS  Your health care provider will perform a physical exam. During the exam, your health care provider may:  Look in your nose for signs of abnormal growths in your nostrils (nasal polyps).  Tap over the affected sinus to check for signs of infection.  View the inside of your sinuses (endoscopy) using an imaging device that has a light attached (endoscope). If your health care provider suspects that you have chronic sinusitis, one or more of the following tests may be recommended:  Allergy tests.  Nasal culture. A sample of mucus is taken from your nose, sent to a lab, and screened for bacteria.  Nasal cytology. A sample of mucus is taken from your nose and examined by your health care provider to determine if your sinusitis is related to an allergy. TREATMENT  Most cases of acute sinusitis are related to a viral infection and will resolve on their own within 10 days. Sometimes medicines are prescribed to help relieve symptoms (pain medicine, decongestants, nasal steroid sprays, or saline sprays).  However, for sinusitis related to a bacterial infection, your health care provider will prescribe antibiotic medicines. These are medicines that will help kill the bacteria causing the infection.  Rarely, sinusitis is caused by a fungal infection. In theses cases, your health care provider will prescribe antifungal medicine. For some cases of  chronic sinusitis, surgery is needed. Generally, these are cases in which sinusitis recurs more than 3 times per year, despite other treatments. HOME CARE INSTRUCTIONS   Drink plenty of water. Water helps thin the mucus so your sinuses can drain more easily.  Use a humidifier.  Inhale steam 3 to 4 times a day (for example, sit  in the bathroom with the shower running).  Apply a warm, moist washcloth to your face 3 to 4 times a day, or as directed by your health care provider.  Use saline nasal sprays to help moisten and clean your sinuses.  Take medicines only as directed by your health care provider.  If you were prescribed either an antibiotic or antifungal medicine, finish it all even if you start to feel better. SEEK IMMEDIATE MEDICAL CARE IF:  You have increasing pain or severe headaches.  You have nausea, vomiting, or drowsiness.  You have swelling around your face.  You have vision problems.  You have a stiff neck.  You have difficulty breathing. MAKE SURE YOU:   Understand these instructions.  Will watch your condition.  Will get help right away if you are not doing well or get worse. Document Released: 08/06/2005 Document Revised: 12/21/2013 Document Reviewed: 08/21/2011 [facility] Patient Information 2015 [place], [place]. This information is not intended to replace advice given to you by your health care provider. Make sure you discuss any questions you have with your health care provider.

## 2015-01-06 ENCOUNTER — Encounter: Payer: Self-pay | Admitting: Family Medicine

## 2015-01-06 ENCOUNTER — Ambulatory Visit (INDEPENDENT_AMBULATORY_CARE_PROVIDER_SITE_OTHER): Payer: Self-pay | Admitting: Family Medicine

## 2015-01-06 VITALS — BP 163/76 | HR 84 | Temp 97.6°F | Ht 67.0 in | Wt 292.2 lb

## 2015-01-06 DIAGNOSIS — R21 Rash and other nonspecific skin eruption: Secondary | ICD-10-CM

## 2015-01-06 DIAGNOSIS — L659 Nonscarring hair loss, unspecified: Secondary | ICD-10-CM

## 2015-01-06 LAB — COMPREHENSIVE METABOLIC PANEL
ALBUMIN: 4.1 g/dL (ref 3.5–5.2)
ALK PHOS: 109 U/L (ref 39–117)
ALT: 18 U/L (ref 0–35)
AST: 18 U/L (ref 0–37)
BUN: 12 mg/dL (ref 6–23)
CO2: 28 mEq/L (ref 19–32)
Calcium: 8.9 mg/dL (ref 8.4–10.5)
Chloride: 104 mEq/L (ref 96–112)
Creat: 0.83 mg/dL (ref 0.50–1.10)
Glucose, Bld: 123 mg/dL — ABNORMAL HIGH (ref 70–99)
POTASSIUM: 3.7 meq/L (ref 3.5–5.3)
Sodium: 139 mEq/L (ref 135–145)
Total Bilirubin: 0.4 mg/dL (ref 0.2–1.2)
Total Protein: 6.6 g/dL (ref 6.0–8.3)

## 2015-01-06 LAB — POCT SKIN KOH: Skin KOH, POC: NEGATIVE

## 2015-01-06 NOTE — Patient Instructions (Signed)
I will call you with lab results May start medicine to treat for fungal infection of scalp. Wait on lab results first  Be well, Dr. Pollie MeyerMcIntyre

## 2015-01-07 ENCOUNTER — Telehealth: Payer: Self-pay | Admitting: Family Medicine

## 2015-01-07 DIAGNOSIS — B360 Pityriasis versicolor: Secondary | ICD-10-CM | POA: Insufficient documentation

## 2015-01-07 DIAGNOSIS — R7309 Other abnormal glucose: Secondary | ICD-10-CM

## 2015-01-07 LAB — TSH: TSH: 0.903 u[IU]/mL (ref 0.350–4.500)

## 2015-01-07 MED ORDER — GRISEOFULVIN MICROSIZE 500 MG PO TABS: 500.0000 mg | ORAL_TABLET | Freq: Every day | ORAL | Status: DC

## 2015-01-07 NOTE — Telephone Encounter (Signed)
Called pt to discuss lab results. Normal TSH, CMET remarkable only for elevated blood sugar. Will have pt return for lab visit to get A1c For scalp, will rx griseofulvin 500mg  daily x 6 weeks.  She will f/u with PCP Dr. Birdie SonsSonnenberg in 6 weeks. Pt appreciative of call.  Latrelle DodrillBrittany J McIntyre, MD

## 2015-01-07 NOTE — Assessment & Plan Note (Signed)
Etiology not clear. May represent tinea capitis. Will check TSH and see met. If these are unremarkable, we'll plan to treat for tinea capitis 6 weeks of griseofulvin therapy. Precepted with Dr. Andria Frames who also examined patient and agrees with this plan. Follow-up with PCP in 6 weeks.

## 2015-01-07 NOTE — Progress Notes (Signed)
[name], female   DOB: 02/09/1961, 54 y.o.   MRN: [id number]  HPI:  Pt presents for a same day appointment to discuss hair falling out.  Patient reports since February she has had issues with her hair falling out. She has noticed scabbing and weeping on the posterior aspect of her scalp. She's tried multiple treatments including olive oil, aloe vera, medicated ointments, hair greases, and head and shoulders. Notes that her skin are tender and weak. Also feels like she has odor coming from her scalp. She got a perm in February, but states that this issue preceded the perm. She does not take any medications at all presently. No recent particular stressors since February.   Also has a rash on her left arm. Gets this every summer with sun exposure. Denies any new or different sores in her mouth or genitals, new medications or foods, fevers.  ROS: See HPI  [place]: History of hypertension, sleep apnea, allergic rhinitis, GERD, osteoarthritis  PHYSICAL EXAM: BP 163/76 mmHg  Pulse 84  Temp(Src) 97.6 F (36.4 C) (Oral)  Ht '5\' 7"'  (1.702 m)  Wt 292 lb 3.2 oz (132.541 kg)  BMI 45.75 kg/m2 Gen: No acute distress, pleasant, cooperative HEENT: Normocephalic, atraumatic, sick care with some thinning and posterior aspect of scalp. Small scabbing visible. No visible drainage or skin breakdown other than slight residual scabs. Oropharynx clear and moist without lesions. Skin:  Light patches on left arm appear dry and slightly scaly. no drainage or skin breakdown.  Neuro: Grossly nonfocal, speech normal  ASSESSMENT/PLAN:  Hair loss Etiology not clear. May represent tinea capitis. Will check TSH and see met. If these are unremarkable, we'll plan to treat for tinea capitis 6 weeks of griseofulvin therapy. Precepted with Dr. Andria Frames who also examined patient and agrees with this plan. Follow-up with PCP in 6 weeks.   Rash Rash on arm is potentially fungal in etiology. May be tinea  versicolor. Suspect that this would improve with treatment for tinea capitis. Will obtain KOH skin prep today.      FOLLOW UP: F/u in 6 weeks with PCP for above issues  [place] J. Ardelia Mems, [place]

## 2015-01-07 NOTE — Assessment & Plan Note (Signed)
Rash on arm is potentially fungal in etiology. May be tinea versicolor. Suspect that this would improve with treatment for tinea capitis. Will obtain KOH skin prep today.

## 2015-01-11 ENCOUNTER — Ambulatory Visit: Payer: Self-pay | Attending: Internal Medicine

## 2015-01-18 ENCOUNTER — Other Ambulatory Visit: Payer: Self-pay | Admitting: Family Medicine

## 2015-01-18 MED ORDER — KETOCONAZOLE 2 % EX SHAM: MEDICATED_SHAMPOO | CUTANEOUS | Status: DC

## 2015-01-18 NOTE — Telephone Encounter (Signed)
Red team, please call pt and let her know:  -Will send ketoconazole shampoo in for patient.  Apply 5 to 10 mL to wet scalp, lather, leave on 3 to 5 minutes, and rinse; twice weekly for 3 weeks -This may not work but is reasonable to try -Needs to schedule a follow up visit with Dr. Birdie SonsSonnenberg for her scalp  Latrelle DodrillBrittany J Winefred Hillesheim, MD

## 2015-01-18 NOTE — Telephone Encounter (Signed)
Will forward to MD. Jazmin Hartsell,CMA  

## 2015-01-18 NOTE — Telephone Encounter (Signed)
Will forward to the physician who saw the patient to determine the next course of action.

## 2015-01-18 NOTE — Telephone Encounter (Signed)
Scalp is still itching and she is still picking out scabs. Would like a RX for the shampoo for ring worm [facility] Please advise

## 2015-01-19 NOTE — Telephone Encounter (Signed)
Patient informed, expressed understanding. 

## 2015-03-21 ENCOUNTER — Telehealth: Payer: Self-pay | Admitting: Family Medicine

## 2015-03-21 DIAGNOSIS — T85848D Pain due to other internal prosthetic devices, implants and grafts, subsequent encounter: Secondary | ICD-10-CM

## 2015-03-21 NOTE — Telephone Encounter (Signed)
Patient has the orange card and is needing a referral to the dental clinic because she had 2 filling fall out over the weekend.  The dental clinic on [address] said they will go ahead and see her if we would sent it over today.

## 2015-03-21 NOTE — Telephone Encounter (Signed)
Will forward to MD. Jearld Hemp,CMA  

## 2015-03-22 NOTE — Telephone Encounter (Signed)
Referral placed.  [name]. [name], MD [facility] Resident PGY-2 03/22/2015 9:00 AM

## 2015-03-25 ENCOUNTER — Encounter: Payer: Self-pay | Admitting: Family Medicine

## 2015-03-25 ENCOUNTER — Ambulatory Visit (INDEPENDENT_AMBULATORY_CARE_PROVIDER_SITE_OTHER): Payer: Self-pay | Admitting: Family Medicine

## 2015-03-25 VITALS — BP 157/73 | HR 78 | Temp 98.2°F | Ht 67.0 in | Wt 297.0 lb

## 2015-03-25 DIAGNOSIS — R1904 Left lower quadrant abdominal swelling, mass and lump: Secondary | ICD-10-CM

## 2015-03-25 DIAGNOSIS — L659 Nonscarring hair loss, unspecified: Secondary | ICD-10-CM

## 2015-03-25 DIAGNOSIS — F4329 Adjustment disorder with other symptoms: Secondary | ICD-10-CM

## 2015-03-25 MED ORDER — CITALOPRAM HYDROBROMIDE 20 MG PO TABS: 20.0000 mg | ORAL_TABLET | Freq: Every day | ORAL | Status: DC

## 2015-03-25 NOTE — Assessment & Plan Note (Addendum)
Patient's symptoms meet criteria for major depression. Has significant stress from taking care of mother with dementia. Will start patient on celexa 24m daily. Follow up in 2-3 weeks. Can increase to 484mat that time.  Patient also met with Dr KaGwenlyn Saranoday. Recommended counseling to which the patient though hospice. Dr KaGwenlyn Saranill contact patient in 1 week to follow up. Please see her separate note for additional details.

## 2015-03-25 NOTE — Progress Notes (Signed)
Dr. [name] requested a Behavioral Health Consult mostly for additional support and possible identification of coping.  Dr. Alanda Slim was present for this consultation.  30 minutes of face to face time was spent  Presenting Issue: Very briefly, [name] in the primary care taker of her mother, who has end stage cirrhosis diagnosed over one year ago and her father who is blind and incontinent.  Also living in the home is [name]' daughter who separated from her husband recently and her granddaughter.  Hospice is involved since October.  Her mom goes to adult day care.  Report of symptoms: Did not elicit report of symptoms as Dr. [name] had already done that and obtained a PHQ-9.  See his note for details.  - Diagnoses: Denies prior history of mental health issues or treatment.  Assessment / Plan / Recommendations:  Caregiver stress vs. Depression vs. grief.  It seems her emotion is an appropriate reaction to this incredibly challenging situation.  However, given symptom picture and patient's belief that medication might be helpful, starting an antidepressant seems reasonable.  Counseling through hospice could be helpful both for grieving and caregiver burden. She finds the Hospice nurse and social worker that comes to the house to be supportive but the focus is on her mother, not on her.  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  Discussed.  Patient agreed I could call her in one week to check on medication tolerability.  Will also reassess motivation to obtain counseling as I think this would be good both in the short term and longer term as the situation with her mom progresses.

## 2015-03-25 NOTE — Assessment & Plan Note (Signed)
Hernia stable without signs of incarceration. Offered surgery referral, however patient deferred. Stated that she wanted to lose weight before proceeding with surgery.

## 2015-03-25 NOTE — Progress Notes (Signed)
Subjective:  [name] is a 54 y.o. female who presents to the [facility] today with a chief complaint of Hair Loss.   HPI:  Hair Loss / Dandruff Started last year and now worsening. Trried over the counter medications, dandruff shampoos, and tar, none of which helped. Was seen in our clinic over the past several months on tried [name] and [name] shampoo - neither of which helped. Reports that she has lost "a lot" of hair since last year. Has noticed increased scaling. Reports that her scalp is very itchy. Has tried several oils which help some. Located all over her scalp, but mostly in the posterior region.   Hernia Chronic problem per patient. Reports that she has had it since 2010 after her fibroid surgery. Has started to hurt more and has been growing larger. No severe abdominal pain. Has history of constipation that is stable. No blood noted in stool.    Stress / Depression Patient reports increased stress over the past 16 months due to taking care of her mother who has dementia. Patient's mother is at an adult daycare during the day, but lives with the patient the rest of the time. Patient reports that she has to dedicate a lot of time to taking care of her mother. Patient also reports that she has noticed that her mood has been more depressed, irritable, and labile lately. Reports that she has found herself crying over things she would not normally cry about. Currently has good support at church and home. Denies any prior manic episodes.   ROS: Sleep - Has been sleeping less than normal. Reports only having about 4 hours of sleep per night. Endorses difficulty falling asleep and staying asleep.  Interests- Continues to get enjoyment out of listening to music Guilt- Feels guilty about finances and not having retirement savings Energy- Feels tired all the time. Concentration- Reports feeling very easily distracted Appetite- Has been overeating. Psychomotor- Feels like  moving at slower pace.  Suicidality- Sometimes feels overwhelmed, but denies any suicidal thoughts or self injurious behaviors.   ROS: No chest pain or shortness of breath, otherwise all systems reviewed and are negative  PMH:  The following were reviewed and entered/updated in epic: Past Medical History  Diagnosis Date  . Hypertension   . GERD (gastroesophageal reflux disease)   . Umbilical hernia   . Arthritis   . UTI (lower urinary tract infection)     recent- unable to finish med only took 2 days  . 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 Resolved with thoracic epidural steroid injection.   Marland Kitchen PONV (postoperative nausea and vomiting)     with pain med  . Sleep apnea     previously no longer neg sleep study  . Fatty liver    Patient Active Problem List   Diagnosis Date Noted  . Stress and adjustment reaction 03/25/2015  . Rash 01/07/2015  . Vertigo 02/24/2014  . Large breasts 12/19/2013  . Arthritis of knee 10/06/2013  . Dysuria 09/22/2013  . Preop examination 09/07/2013  . Hair loss 09/07/2013  . Leg cramping 01/29/2013  . Allergic rhinitis 01/04/2012  . Abdominal mass, left lower quadrant 06/18/2011  . Inflammatory arthritis 12/26/2010  . OBESITY, MORBID 09/26/2010  . GERD 10/18/2009  . BACK PAIN, CHRONIC 05/14/2008  . SLEEP APNEA, OBSTRUCTIVE, SEVERE 11/10/2006  . HYPERTENSION, BENIGN SYSTEMIC 10/17/2006  . OSTEOARTHRITIS, LOWER LEG 10/17/2006   Past Surgical History  Procedure Laterality Date  .  Laminectomy and microdiscectomy lumbar spine  2004; 2006  . 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 Cesarean section  1989  . Back surgery  04,06    x2  . 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 Total knee arthroplasty Right 11/09/2013    Procedure: RIGHT TOTAL KNEE ARTHROPLASTY;  Surgeon:  Renette Butters, MD;  Location: [place];  Service: Orthopedics;  Laterality: Right;  . Carpal tunnel release Bilateral ?1999  . Abdominal hysterectomy  2004    "partial"     Objective:  Physical Exam: BP 157/73 mmHg  Pulse 78  Temp(Src) 98.2 F (36.8 C) (Oral)  Ht _0  (1.702 m)  Wt 297 lb (134.718 kg)  BMI 46.51 kg/m2  Gen: NAD, resting comfortably Scalp: Several flakes noted on scalp. No significant redness. No other skin discoloration. Small 37m patches without hair noted throughout scalp CV: RRR with no murmurs appreciated Lungs: NWOB, CTAB with no crackles, wheezes, or rhonchi GI: Obese, Normal bowel sounds present. Approximately 5cm reducible mass noted in LLQ. NT.  MSK: no edema, cyanosis, or clubbing noted Skin: warm, dry Neuro: grossly normal, moves all extremities Psych: Labile affect (occasionally tearful on exam), normal thought content. No SI.   Assessment/Plan:  Hair loss Unclear etiology. Has failed treatment for tinea. Has also failed numerous dandruff treatments. Will refer to dermatology.   Abdominal mass, left lower quadrant Hernia stable without signs of incarceration. Offered surgery referral, however patient deferred. Stated that she wanted to lose weight before proceeding with surgery.  Stress and adjustment reaction Patient's symptoms meet criteria for major depression. Has significant stress from taking care of mother with dementia. Will start patient on celexa 221mdaily. Follow up in 2-3 weeks. Can increase to [id number] that time.  Patient also met with Dr KanGwenlyn Saranday. Recommended counseling to which the patient though hospice. Dr KanGwenlyn Saranll contact patient in 1 week to follow up. Please see her separate note for additional details.     CalAlgis GreenhousearJerline PainD [place] PGY-2 03/25/2015 12:22 PM

## 2015-03-25 NOTE — Patient Instructions (Signed)
Thank you for coming to the clinic today. It was nice seeing you.  For your dandruff and hair loss, we will send in a referral to dermatology today.  For your hernia, the only way to fix it is through surgery. When you are ready for surgery, we will be happy to send in a referral.   For your stress and depression, we will start an antidepressant today called Celexa. It takes around 6 weeks to see the full effects of this medication, but you should start feeling better within 2 weeks.  You are also due for your pap smear and mammogram - we can discuss obtaining these at your next visit.  Please come back in 2-3 weeks to discuss your mood and the other issues that we did not get to address today.

## 2015-03-25 NOTE — Assessment & Plan Note (Signed)
Unclear etiology. Has failed treatment for tinea. Has also failed numerous dandruff treatments. Will refer to dermatology.

## 2015-03-30 ENCOUNTER — Telehealth: Payer: Self-pay | Admitting: Psychology

## 2015-03-30 NOTE — Telephone Encounter (Signed)
Called patient to check on medication and counseling.  She reported she didn't get the medicine yet secondary to cost.  It is $9 at the outpatient pharmacy.  Discussed the $4 list at [facility] pharmacy.  She thinks that would be great and staets she plans to explore.  She is still considering counseling.  Has been busy with her mother this week especially.  She plans to follow-up with Dr. [name] as scheduled.  Told her if she has an appointment in the morning [facility] are available to meet with her as needed.  Will await follow-up from physician.  If he would like Visual merchandiser support via phone calls or in person visits, we can certainly provide.

## 2015-04-07 ENCOUNTER — Ambulatory Visit (INDEPENDENT_AMBULATORY_CARE_PROVIDER_SITE_OTHER): Payer: Self-pay | Admitting: Family Medicine

## 2015-04-07 ENCOUNTER — Encounter: Payer: Self-pay | Admitting: Family Medicine

## 2015-04-07 VITALS — BP 152/85 | HR 76 | Temp 98.2°F | Ht 67.0 in | Wt 297.2 lb

## 2015-04-07 DIAGNOSIS — H538 Other visual disturbances: Secondary | ICD-10-CM

## 2015-04-07 DIAGNOSIS — I10 Essential (primary) hypertension: Secondary | ICD-10-CM

## 2015-04-07 HISTORY — DX: Other visual disturbances: H53.8

## 2015-04-07 LAB — POCT GLYCOSYLATED HEMOGLOBIN (HGB A1C): Hemoglobin A1C: 5.9

## 2015-04-07 MED ORDER — LISINOPRIL 10 MG PO TABS: 10.0000 mg | ORAL_TABLET | Freq: Every day | ORAL | Status: DC

## 2015-04-07 NOTE — Assessment & Plan Note (Signed)
Has been out of lisinopril for several weeks now. - Refilled lisinopril.  Advised follow-up with PCP

## 2015-04-07 NOTE — Assessment & Plan Note (Signed)
Blurry vision, floaters and flashing lights and left eye concerning for vitreous or retinal detachment - Check A1c, given history of prediabetes  - Referral to ophthalmology - Advised restarting blood pressure medication and keeping blood pressure under well control

## 2015-04-07 NOTE — Patient Instructions (Signed)
It was great seeing you today.   1. I have referred you to to ophthalmology for your left eye blurry vision, floaters and flashing lights.  Please call if you have not heard from them in 1-2 weeks.  2. Restart your lisinopril 10 mg once a day 3. I will check your blood Sugar today, please follow-up with Dr. [name] in 2-3 weeks for this.  4. If able, check your blood pressure daily and bring this log to your next appointment.    Please bring all your medications to every doctors visit  Sign up for My Chart to have easy access to your labs results, and communication with your Primary care physician.  Next Appointment  Please make an appointment with Dr [name] in 2-3 weeks for blood pressure and blood sugar.    If you have any questions or concerns before then, please call the clinic at [phone].  Take Care,   Dr Wenda Low

## 2015-04-07 NOTE — Progress Notes (Signed)
   Subjective:    [name], female    DOB: August 11, 1961, 54 y.o.   MRN: [id number]  Seen for Same day visit for   CC: Left eye blurry  She reports blurry vision in left eye associated with floaters beginning 3 days ago.  2 days ago she started noticing flashing lights in her left eye that occur several times a day.  She denies any loss of vision in either eye.  Denies any previous eye trauma or surgeries.  Denies double vision or headaches.  Denies any fevers or chills.  Denies any temporal pain, rash, or ear pain / jaw pain.  Denies previous strokes or heart attacks.   Review of Systems   See HPI for ROS. Objective:  BP 152/85 mmHg  Pulse 76  Temp(Src) 98.2 F (36.8 C) (Oral)  Ht  (1.702 m)  Wt 297 lb 4 oz (134.832 kg)  BMI 46.55 kg/m2  General: NAD HEENT: PERRLA; EOMI; Sclera white; Conjunctiva pink; Visual Fields full by confrontation; Opthlamic exam revealed sharp disc margins, unable to visualize entire retina Cardiac: RRR, normal heart sounds, no murmurs. 2+ radial and PT pulses bilaterally   Assessment & Plan:  See Problem List Documentation

## 2015-04-15 ENCOUNTER — Ambulatory Visit: Payer: Self-pay | Admitting: Family Medicine

## 2015-07-19 ENCOUNTER — Encounter: Payer: Self-pay | Admitting: Internal Medicine

## 2015-07-19 ENCOUNTER — Ambulatory Visit (INDEPENDENT_AMBULATORY_CARE_PROVIDER_SITE_OTHER): Payer: Self-pay | Admitting: Internal Medicine

## 2015-07-19 VITALS — BP 154/86 | HR 90 | Temp 98.4°F | Wt 300.0 lb

## 2015-07-19 DIAGNOSIS — M79602 Pain in left arm: Secondary | ICD-10-CM | POA: Insufficient documentation

## 2015-07-19 MED ORDER — GABAPENTIN 300 MG PO CAPS: 300.0000 mg | ORAL_CAPSULE | Freq: Three times a day (TID) | ORAL | Status: DC

## 2015-07-19 NOTE — Patient Instructions (Addendum)
It was nice meeting you today [name]!  I would like you to begin taking gabapentin today to help with your pain. You can take one 300 mg pill today, then take two 300 mg pills tomorrow, and finally three 300 mg pills the next day (two days from now). After that you can continue to take three 300 mg pills per day. I have also provided some exercises below that I would like you to try every day.   If your pain has not improved in two weeks, please make another appointment.   If you have any questions or concerns, please feel free to call our office.   Be well,  Dr. Natale Milch  Shoulder Range of Motion Exercises Shoulder range of motion (ROM) exercises are designed to keep the shoulder moving freely. They are often recommended for people who have shoulder pain. MOVEMENT EXERCISE When you are able, do this exercise 5-6 days per week, or as told by your health care provider. Work toward doing 2 sets of 10 swings. Pendulum Exercise How To Do This Exercise Lying Down  Lie face-down on a bed with your abdomen close to the side of the bed.  Let your arm hang over the side of the bed.  Relax your shoulder, arm, and hand.  Slowly and gently swing your arm forward and back. Do not use your neck muscles to swing your arm. They should be relaxed. If you are struggling to swing your arm, have someone gently swing it for you. When you do this exercise for the first time, swing your arm at a 15 degree angle for 15 seconds, or swing your arm 10 times. As pain lessens over time, increase the angle of the swing to 30-45 degrees.  Repeat steps 1-4 with the other arm. How To Do This Exercise While Standing  Stand next to a sturdy chair or table and hold on to it with your hand.  Bend forward at the waist.  Bend your knees slightly.  Relax your other arm and let it hang limp.  Relax the shoulder blade of the arm that is hanging and let it drop.  While keeping your shoulder relaxed, use body  motion to swing your arm in small circles. The first time you do this exercise, swing your arm for about 30 seconds or 10 times. When you do it next time, swing your arm for a little longer.  Stand up tall and relax.  Repeat steps 1-7, this time changing the direction of the circles.  Repeat steps 1-8 with the other arm. STRETCHING EXERCISES Do these exercises 3-4 times per day on 5-6 days per week or as told by your health care provider. Work toward holding the stretch for 20 seconds. Stretching Exercise 1  Lift your arm straight out in front of you.  Bend your arm 90 degrees at the elbow (right angle) so your forearm goes across your body and looks like the letter "L."  Use your other arm to gently pull the elbow forward and across your body.  Repeat steps 1-3 with the other arm. Stretching Exercise 2 You will need a towel or rope for this exercise.  Bend one arm behind your back with the palm facing outward.  Hold a towel with your other hand.  Reach the arm that holds the towel above your head, and bend that arm at the elbow. Your wrist should be behind your neck.  Use your free hand to grab the free end of the  towel.  With the higher hand, gently pull the towel up behind you.  With the lower hand, pull the towel down behind you.  Repeat steps 1-6 with the other arm. STRENGTHENING EXERCISES Do each of these exercises at four different times of day (sessions) every day or as told by your health care provider. To begin with, repeat each exercise 5 times (repetitions). Work toward doing 3 sets of 12 repetitions or as told by your health care provider. Strengthening Exercise 1 You will need a light weight for this activity. As you grow stronger, you may use a heavier weight.  Standing with a weight in your hand, lift your arm straight out to the side until it is at the same height as your shoulder.  Bend your arm at 90 degrees so that your fingers are pointing to the  ceiling.  Slowly raise your hand until your arm is straight up in the air.  Repeat steps 1-3 with the other arm. Strengthening Exercise 2 You will need a light weight for this activity. As you grow stronger, you may use a heavier weight.  Standing with a weight in your hand, gradually move your straight arm in an arc, starting at your side, then out in front of you, then straight up over your head.  Gradually move your other arm in an arc, starting at your side, then out in front of you, then straight up over your head.  Repeat steps 1-2 with the other arm. Strengthening Exercise 3 You will need an elastic band for this activity. As you grow stronger, gradually increase the size of the bands or increase the number of bands that you use at one time.  While standing, hold an elastic band in one hand and raise that arm up in the air.  With your other hand, pull down the band until that hand is by your side.  Repeat steps 1-2 with the other arm.   This information is not intended to replace advice given to you by your health care provider. Make sure you discuss any questions you have with your health care provider.   Document Released: 05/05/2003 Document Revised: 12/21/2014 Document Reviewed: 08/02/2014 Elsevier Interactive Patient Education 2016 [employer]  Generic Shoulder Exercises EXERCISES  RANGE OF MOTION (ROM) AND STRETCHING EXERCISES These exercises may help you when beginning to rehabilitate your injury. Your symptoms may resolve with or without further involvement from your physician, physical therapist or athletic trainer. While completing these exercises, remember:   Restoring tissue flexibility helps normal motion to return to the joints. This allows healthier, less painful movement and activity.  An effective stretch should be held for at least 30 seconds.  A stretch should never be painful. You should only feel a gentle lengthening or release in the stretched  tissue. ROM - Pendulum  Bend at the waist so that your right / left arm falls away from your body. Support yourself with your opposite hand on a solid surface, such as a table or a countertop.  Your right / left arm should be perpendicular to the ground. If it is not perpendicular, you need to lean over farther. Relax the muscles in your right / left arm and shoulder as much as possible.  Gently sway your hips and trunk so they move your right / left arm without any use of your right / left shoulder muscles.  Progress your movements so that your right / left arm moves side to side, then forward and backward,  and finally, both clockwise and counterclockwise.  Complete __________ repetitions in each direction. Many people use this exercise to relieve discomfort in their shoulder as well as to gain range of motion. Repeat __________ times. Complete this exercise __________ times per day. STRETCH - Flexion, Standing  Stand with good posture. With an underhand grip on your right / left hand and an overhand grip on the opposite hand, grasp a broomstick or cane so that your hands are a little more than shoulder-width apart.  Keeping your right / left elbow straight and shoulder muscles relaxed, push the stick with your opposite hand to raise your right / left arm in front of your body and then overhead. Raise your arm until you feel a stretch in your right / left shoulder, but before you have increased shoulder pain.  Try to avoid shrugging your right / left shoulder as your arm rises by keeping your shoulder blade tucked down and toward your mid-back spine. Hold __________ seconds.  Slowly return to the starting position. Repeat __________ times. Complete this exercise __________ times per day. STRETCH - Internal Rotation  Place your right / left hand behind your back, palm-up.  Throw a towel or belt over your opposite shoulder. Grasp the towel/belt with your right / left hand.  While keeping  an upright posture, gently pull up on the towel/belt until you feel a stretch in the front of your right / left shoulder.  Avoid shrugging your right / left shoulder as your arm rises by keeping your shoulder blade tucked down and toward your mid-back spine.  Hold __________. Release the stretch by lowering your opposite hand. Repeat __________ times. Complete this exercise __________ times per day. STRETCH - External Rotation and Abduction  Stagger your stance through a doorframe. It does not matter which foot is forward.  As instructed by your physician, physical therapist or athletic trainer, place your hands:  And forearms above your head and on the door frame.  And forearms at head-height and on the door frame.  At elbow-height and on the door frame.  Keeping your head and chest upright and your stomach muscles tight to prevent over-extending your low-back, slowly shift your weight onto your front foot until you feel a stretch across your chest and/or in the front of your shoulders.  Hold __________ seconds. Shift your weight to your back foot to release the stretch. Repeat __________ times. Complete this stretch __________ times per day.  STRENGTHENING EXERCISES  These exercises may help you when beginning to rehabilitate your injury. They may resolve your symptoms with or without further involvement from your physician, physical therapist or athletic trainer. While completing these exercises, remember:   Muscles can gain both the endurance and the strength needed for everyday activities through controlled exercises.  Complete these exercises as instructed by your physician, physical therapist or athletic trainer. Progress the resistance and repetitions only as guided.  You may experience muscle soreness or fatigue, but the pain or discomfort you are trying to eliminate should never worsen during these exercises. If this pain does worsen, stop and make certain you are following the  directions exactly. If the pain is still present after adjustments, discontinue the exercise until you can discuss the trouble with your clinician.  If advised by your physician, during your recovery, avoid activity or exercises which involve actions that place your right / left hand or elbow above your head or behind your back or head. These positions stress the tissues which are  trying to heal. STRENGTH - Scapular Depression and Adduction  With good posture, sit on a firm chair. Supported your arms in front of you with pillows, arm rests or a table top. Have your elbows in line with the sides of your body.  Gently draw your shoulder blades down and toward your mid-back spine. Gradually increase the tension without tensing the muscles along the top of your shoulders and the back of your neck.  Hold for __________ seconds. Slowly release the tension and relax your muscles completely before completing the next repetition.  After you have practiced this exercise, remove the arm support and complete it in standing as well as sitting. Repeat __________ times. Complete this exercise __________ times per day.  STRENGTH - External Rotators  Secure a rubber exercise band/tubing to a fixed object so that it is at the same height as your right / left elbow when you are standing or sitting on a firm surface.  Stand or sit so that the secured exercise band/tubing is at your side that is not injured.  Bend your elbow 90 degrees. Place a folded towel or small pillow under your right / left arm so that your elbow is a few inches away from your side.  Keeping the tension on the exercise band/tubing, pull it away from your body, as if pivoting on your elbow. Be sure to keep your body steady so that the movement is only coming from your shoulder rotating.  Hold __________ seconds. Release the tension in a controlled manner as you return to the starting position. Repeat __________ times. Complete this exercise  __________ times per day.  STRENGTH - Supraspinatus  Stand or sit with good posture. Grasp a __________ weight or an exercise band/tubing so that your hand is "thumbs-up," like when you shake hands.  Slowly lift your right / left hand from your thigh into the air, traveling about 30 degrees from straight out at your side. Lift your hand to shoulder height or as far as you can without increasing any shoulder pain. Initially, many people do not lift their hands above shoulder height.  Avoid shrugging your right / left shoulder as your arm rises by keeping your shoulder blade tucked down and toward your mid-back spine.  Hold for __________ seconds. Control the descent of your hand as you slowly return to your starting position. Repeat __________ times. Complete this exercise __________ times per day.  STRENGTH - Shoulder Extensors  Secure a rubber exercise band/tubing so that it is at the height of your shoulders when you are either standing or sitting on a firm arm-less chair.  With a thumbs-up grip, grasp an end of the band/tubing in each hand. Straighten your elbows and lift your hands straight in front of you at shoulder height. Step back away from the secured end of band/tubing until it becomes tense.  Squeezing your shoulder blades together, pull your hands down to the sides of your thighs. Do not allow your hands to go behind you.  Hold for __________ seconds. Slowly ease the tension on the band/tubing as you reverse the directions and return to the starting position. Repeat __________ times. Complete this exercise __________ times per day.  STRENGTH - Scapular Retractors  Secure a rubber exercise band/tubing so that it is at the height of your shoulders when you are either standing or sitting on a firm arm-less chair.  With a palm-down grip, grasp an end of the band/tubing in each hand. Straighten your elbows and lift  your hands straight in front of you at shoulder height. Step back  away from the secured end of band/tubing until it becomes tense.  Squeezing your shoulder blades together, draw your elbows back as you bend them. Keep your upper arm lifted away from your body throughout the exercise.  Hold __________ seconds. Slowly ease the tension on the band/tubing as you reverse the directions and return to the starting position. Repeat __________ times. Complete this exercise __________ times per day. STRENGTH - Scapular Depressors  Find a sturdy chair without wheels, such as a from a dining room table.  Keeping your feet on the floor, lift your bottom from the seat and lock your elbows.  Keeping your elbows straight, allow gravity to pull your body weight down. Your shoulders will rise toward your ears.  Raise your body against gravity by drawing your shoulder blades down your back, shortening the distance between your shoulders and ears. Although your feet should always maintain contact with the floor, your feet should progressively support less body weight as you get stronger.  Hold __________ seconds. In a controlled and slow manner, lower your body weight to begin the next repetition. Repeat __________ times. Complete this exercise __________ times per day.    This information is not intended to replace advice given to you by your health care provider. Make sure you discuss any questions you have with your health care provider.   Document Released: 06/20/2005 Document Revised: 08/27/2014 Document Reviewed: 11/18/2008 Elsevier Interactive Patient Education [employer]

## 2015-07-19 NOTE — Assessment & Plan Note (Signed)
Given description of burning sensation and numbness, pain seems mostly neuropathic. Nerve impingement is a likely diagnosis, and could have been caused by patient's frequent nudging motion to help her mother get in the car. Rotator cuff tear/injury is less likely given patient's description of symptoms and physical exam findings.  - Gabapentin 300 mg TID  - Gave patient handout of ROM exercises for shoulder - Return in 2 weeks if symptoms not improved

## 2015-07-19 NOTE — Progress Notes (Signed)
   Subjective:    [name], female    DOB: 12-Apr-1961, 54 y.o.   MRN: [id number]  HPI  [name] is a 54 yo F presenting with L arm pain.   Arm pain The pain began two weeks ago. [name] woke up in the morning and the pain was suddenly present. She experiences a constant throbbing pain throughout her entire arm and across her L scapula, with occasional sharp pains as well. She is also experiencing numbness and burning in her L fourth and fifth digits, as well as along the dorsal aspect of her arm. The pain is worst when she extends her arm behind her body and when she lays on her arm. The pain is slightly relieved by holding her arm above her head. She has taken Flexeril (from a prior injury) which has not helped with the pain. Tylenol, ice packs and a heating pad helped initially but now do not provide any relief. She denies any weakness but says her activities are limited by pain.   Review of Systems See HPI.    Objective:   Physical Exam  Constitutional: She appears well-developed and well-nourished. No distress.  HENT:  Head: Normocephalic and atraumatic.  Musculoskeletal:  Decreased grip strength in L arm, pain upon empty can test and all other ROM maneuvers  Neurological:  Decreased sensation to light touch along fourth and fifth fingers of L hand as well as upper portion of L arm   Psychiatric: She has a normal mood and affect. Her behavior is normal.  Vitals reviewed.     Assessment & Plan:  Left arm pain Given description of burning sensation and numbness, pain seems mostly neuropathic. Nerve impingement is a likely diagnosis, and could have been caused by patient's frequent nudging motion to help her mother get in the car. Rotator cuff tear/injury is less likely given patient's description of symptoms and physical exam findings.  - Gabapentin 300 mg TID  - Gave patient handout of ROM exercises for shoulder - Return in 2 weeks if symptoms not  improved    Anne AbernethyAbigail J Mailyn Steichen, MD PGY-1 [facility]

## 2015-08-04 ENCOUNTER — Ambulatory Visit: Payer: Self-pay

## 2015-08-11 ENCOUNTER — Ambulatory Visit: Payer: Self-pay

## 2015-09-23 ENCOUNTER — Ambulatory Visit (INDEPENDENT_AMBULATORY_CARE_PROVIDER_SITE_OTHER): Payer: Self-pay | Admitting: Family Medicine

## 2015-09-23 VITALS — BP 162/101 | HR 78 | Temp 98.6°F | Wt 296.0 lb

## 2015-09-23 DIAGNOSIS — H66002 Acute suppurative otitis media without spontaneous rupture of ear drum, left ear: Secondary | ICD-10-CM

## 2015-09-23 DIAGNOSIS — H6690 Otitis media, unspecified, unspecified ear: Secondary | ICD-10-CM | POA: Insufficient documentation

## 2015-09-23 DIAGNOSIS — J22 Unspecified acute lower respiratory infection: Secondary | ICD-10-CM | POA: Insufficient documentation

## 2015-09-23 DIAGNOSIS — J069 Acute upper respiratory infection, unspecified: Secondary | ICD-10-CM

## 2015-09-23 DIAGNOSIS — J020 Streptococcal pharyngitis: Secondary | ICD-10-CM

## 2015-09-23 DIAGNOSIS — J988 Other specified respiratory disorders: Secondary | ICD-10-CM

## 2015-09-23 LAB — POCT RAPID STREP A (OFFICE): RAPID STREP A SCREEN: NEGATIVE

## 2015-09-23 MED ORDER — CEFTRIAXONE SODIUM 1 G IJ SOLR: 1.0000 g | Freq: Once | INTRAMUSCULAR | Status: DC

## 2015-09-23 MED ORDER — AMOXICILLIN-POT CLAVULANATE 875-125 MG PO TABS: 1.0000 | ORAL_TABLET | Freq: Two times a day (BID) | ORAL | Status: DC

## 2015-09-23 MED ORDER — BENZONATATE 100 MG PO CAPS: 100.0000 mg | ORAL_CAPSULE | Freq: Two times a day (BID) | ORAL | Status: DC | PRN

## 2015-09-23 MED ORDER — AZITHROMYCIN 250 MG PO TABS: ORAL_TABLET | ORAL | Status: DC

## 2015-09-23 MED ORDER — CEFTRIAXONE SODIUM 1 G IJ SOLR
1.0000 g | Freq: Once | INTRAMUSCULAR | Status: AC
  Administered 2015-09-23: 1 g via INTRAMUSCULAR

## 2015-09-23 NOTE — Assessment & Plan Note (Signed)
Patient is here with signs and symptoms consistent with lower respiratory infection. History and physical both concerning for possible bacterial superinfection. Patient states that she first developed a URI approximately one month ago, got acutely better after about 2 weeks of illness, then after a few days her respiratory infection got significantly worse and she is now here today. Exam also yielded left-sided acute otitis media which patient states has been causing her significant discomfort for the past 48 hours. - We'll treat with 1 g of IM ceftriaxone today in clinic - Azithromycin 5 days - I've asked patient to follow-up in our clinic next week. - NSAIDs, fluids, Tessalon for cough.  Next: If patient's otitis media does not show significant improvement by follow-up appointment then one should strongly consider addition of Augmentin to antibiotic therapy.

## 2015-09-23 NOTE — Patient Instructions (Signed)
It was a pleasure seeing you today in our clinic. Today we discussed your cough. Here is the treatment plan we have discussed and agreed upon together:   -  Today we gave you a shot of ceftriaxone here in the clinic. -  I've called in a prescription for azithromycin. Take 2 tablets today. Take 1 tablet daily after that until the bottle is empty. -  Take Tylenol or ibuprofen for any fevers, body aches, or pain. -   Do your best to stay well hydrated with water or Gatorade. -  I would like to have a follow-up in our clinic on Tuesday or Wednesday of next week.

## 2015-09-23 NOTE — Assessment & Plan Note (Signed)
See Assessment/Plan above.

## 2015-09-23 NOTE — Progress Notes (Signed)
COUGH Started at the end of Dec. Cough is productive. Had a period of a few days where her symptoms got better but then got acutely worse about 10 days ago.  Has been coughing for 1 month. Cough is: productive Sputum production: significant Medications tried: mucinex, alka seltzer, thera-flu,  Taking blood pressure medications: ACEi  Symptoms Runny nose: yes Mucous in back of throat: yes Throat burning or reflux: no Wheezing or asthma: no Fever: no Chest Pain: no Shortness of breath: only w/ coughing. Leg swelling: no Hemoptysis: no Weight loss: no  ROS see HPI Smoking Status noted  Objective: BP 162/101 mmHg  Pulse 78  Temp(Src) 98.6 F (37 C) (Oral)  Wt 296 lb (134.265 kg)  SpO2 96% Gen: NAD, alert, cooperative HEENT: NCAT, EOMI, PERRL, OP diffusely erythematous without evidence of exudate, left TM piriformis bulging with suspected purulent fluid behind the membrane, bilateral LAD present, clear rhinorrhea present CV: RRR, no murmur Resp: Good expansion and overall airflow, left lower lobe crackles noted, dry deep cough with deep breathing Ext: No edema, warm, peripheral pulses intact throughout, no calf tenderness.   Assessment and plan:  Lower resp. tract infection Patient is here with signs and symptoms consistent with lower respiratory infection. History and physical both concerning for possible bacterial superinfection. Patient states that she first developed a URI approximately one month ago, got acutely better after about 2 weeks of illness, then after a few days her respiratory infection got significantly worse and she is now here today. Exam also yielded left-sided acute otitis media which patient states has been causing her significant discomfort for the past 48 hours. - We'll treat with 1 g of IM ceftriaxone today in clinic - Azithromycin 5 days - I've asked patient to follow-up in our clinic next week. - NSAIDs, fluids, Tessalon for cough.  Next: If  patient's otitis media does not show significant improvement by follow-up appointment then one should strongly consider addition of Augmentin to antibiotic therapy.  Otitis media See Assessment/Plan above.    Orders Placed This Encounter  Procedures  . POCT rapid strep A    Meds ordered this encounter  Medications  . DISCONTD: amoxicillin-clavulanate (AUGMENTIN) 875-125 MG tablet    Sig: Take 1 tablet by mouth 2 (two) times daily.    Dispense:  20 tablet    Refill:  0  . azithromycin (ZITHROMAX) 250 MG tablet    Sig: Take 2 Tablets ( ) today, then one tablet a day ( ) until the bottle is empty.    Dispense:  6 each    Refill:  0  . benzonatate (TESSALON) 100 MG capsule    Sig: Take 1 capsule (100 mg total) by mouth 2 (two) times daily as needed for cough.    Dispense:  20 capsule    Refill:  0  . DISCONTD: cefTRIAXone (ROCEPHIN) 1 g injection    Sig: Inject 1 g into the muscle once.    Dispense:  1 each    Refill:  0  . cefTRIAXone (ROCEPHIN) injection 1 g    Sig:     Order Specific Question:  Antibiotic Indication:    Answer:  Bacteremia    Order Specific Question:  Other Indication:    Answer:  upper respiratory infection     Kathee Delton, MD,MS,  PGY2 09/23/2015 2:38 PM

## 2015-09-27 ENCOUNTER — Encounter: Payer: Self-pay | Admitting: Family Medicine

## 2015-09-27 ENCOUNTER — Ambulatory Visit (INDEPENDENT_AMBULATORY_CARE_PROVIDER_SITE_OTHER): Payer: Self-pay | Admitting: Family Medicine

## 2015-09-27 VITALS — BP 159/65 | HR 79 | Temp 98.4°F | Wt 297.9 lb

## 2015-09-27 DIAGNOSIS — I10 Essential (primary) hypertension: Secondary | ICD-10-CM

## 2015-09-27 DIAGNOSIS — J988 Other specified respiratory disorders: Secondary | ICD-10-CM

## 2015-09-27 DIAGNOSIS — J22 Unspecified acute lower respiratory infection: Secondary | ICD-10-CM

## 2015-09-27 DIAGNOSIS — H66002 Acute suppurative otitis media without spontaneous rupture of ear drum, left ear: Secondary | ICD-10-CM

## 2015-09-27 MED ORDER — AMOXICILLIN 500 MG PO CAPS: 500.0000 mg | ORAL_CAPSULE | Freq: Two times a day (BID) | ORAL | Status: DC

## 2015-09-27 MED ORDER — LISINOPRIL 10 MG PO TABS: 10.0000 mg | ORAL_TABLET | Freq: Every day | ORAL | Status: DC

## 2015-09-27 NOTE — Assessment & Plan Note (Signed)
Improved: Patient had a very positive response to azithromycin medication regimen. She reports excellent resolution of symptoms however her cough has continued. I informed her that this is not uncommon and she will likely begin to feel even better although the cough will continue to be present. Persistence of this cough is likely secondary to the healing/clearance of the previously infected and damaged tissue. I informed her that cough would likely be present over the next few weeks to a month. Patient stated her understanding and stated that she was "just happy to to be feeling better". - No changes at this time.

## 2015-09-27 NOTE — Patient Instructions (Signed)
It was a pleasure seeing you today in our clinic. Today we discussed your cough and ear pain. Here is the treatment plan we have discussed and agreed upon together:   -  I'm very pleased that the azithromycin we prescribed at our previous visit seems to have really improved your symptoms. -  Today your left ear still had some inflammation and fluid behind it. I have prescribed you a short course of antibiotic for this. As discussed in the clinic, do not fill this prescription for one week. I would like to have you fill this prescription if, and only if, your ear pain persists over the next week. -  Today I've also refilled your prescription for lisinopril. -  Please make a follow-up appointment with Dr. [name] in the next 1-2 months see you can meet your new primary care provider.

## 2015-09-27 NOTE — Assessment & Plan Note (Signed)
Persisting: Patient was noted have otitis media of the left ear at the previous visit. Patient was treated with azithromycin 5 days. Patient's symptoms have improved slightly however she is still experiencing some significant pain and feelings of fullness on that side. Physical exam yielded effusion and some slight erythema to the left side. Patient's final day of azithromycin was today. - I have placed an order for amoxicillin twice a day 7 days.  - I informed patient that she is not to fill this prescription at this time. I have asked that she wait 5-7 days to see if her otalgia or fullness improves. If after 5-7 days she has had no improvement or if at any time this pain worsens and she is to fill this prescription and take this medication to completion. - Patient stated her understanding of this medication regimen.

## 2015-09-27 NOTE — Progress Notes (Signed)
   HPI  CC: Lower respiratory infection follow-up Patient is here to follow-up for her lower respiratory infection. She was seen last week with symptoms concerning for possible lower respiratory tract superinfection. Patient was treated with 5 day course of azithromycin. Patient states that her symptoms have dramatically improved since that time. She endorses feeling significantly better however her cough has persisted. Cough continues to be productive. Sputum is yellow in color. She denies any fevers, chills, shortness of breath, chest pain, nausea, vomiting, or abdominal pain. No diarrhea at this time. Patient endorses continued left-sided ear pain and fullness. At our previous visit he was noted that she had evidence of otitis media of the left ear. Patient states that pain and discomfort in that side is slightly better but not much improved when compared to her respiratory symptoms improvement.  Review of Systems   See HPI for ROS. All other systems reviewed and are negative.  Past medical history and social history reviewed and updated in the EMR as appropriate.  Objective: BP 159/65 mmHg  Pulse 79  Temp(Src) 98.4 F (36.9 C) (Oral)  Wt 297 lb 14.4 oz (135.127 kg)  SpO2 97% Gen: NAD, alert, cooperative, and pleasant. HEENT: NCAT, EOMI, PERRL, left TM bulging and slight erythema. Right TM clear. No LAD. OP clear without exudate. CV: RRR, no murmur Resp: CTAB, no wheezes, non-labored Abd: SNTND, BS present, no guarding or organomegaly   Assessment and plan:  Lower resp. tract infection Improved: Patient had a very positive response to azithromycin medication regimen. She reports excellent resolution of symptoms however her cough has continued. I informed her that this is not uncommon and she will likely begin to feel even better although the cough will continue to be present. Persistence of this cough is likely secondary to the healing/clearance of the previously infected and damaged  tissue. I informed her that cough would likely be present over the next few weeks to a month. Patient stated her understanding and stated that she was "just happy to to be feeling better". - No changes at this time.  Otitis media Persisting: Patient was noted have otitis media of the left ear at the previous visit. Patient was treated with azithromycin 5 days. Patient's symptoms have improved slightly however she is still experiencing some significant pain and feelings of fullness on that side. Physical exam yielded effusion and some slight erythema to the left side. Patient's final day of azithromycin was today. - I have placed an order for amoxicillin twice a day 7 days.  - I informed patient that she is not to fill this prescription at this time. I have asked that she wait 5-7 days to see if her otalgia or fullness improves. If after 5-7 days she has had no improvement or if at any time this pain worsens and she is to fill this prescription and take this medication to completion. - Patient stated her understanding of this medication regimen.    Meds ordered this encounter  Medications  . lisinopril (PRINIVIL,ZESTRIL) 10 MG tablet    Sig: Take 1 tablet (10 mg total) by mouth daily.    Dispense:  32 tablet    Refill:  1  . amoxicillin (AMOXIL) 500 MG capsule    Sig: Take 1 capsule (500 mg total) by mouth 2 (two) times daily.    Dispense:  14 capsule    Refill:  0     Kathee Delton, MD,MS,  PGY2 09/27/2015 8:27 PM

## 2015-09-28 ENCOUNTER — Other Ambulatory Visit: Payer: Self-pay | Admitting: Family Medicine

## 2015-09-28 MED ORDER — FLUCONAZOLE 150 MG PO TABS: 150.0000 mg | ORAL_TABLET | Freq: Once | ORAL | Status: DC

## 2015-09-28 NOTE — Telephone Encounter (Signed)
Pt states that she was recently seen by Dr. Wende Mott and was put on antibiotics. Pt states that she has a yeast infection. Pt requesting diflucan for this.  [name], ASA

## 2015-09-28 NOTE — Telephone Encounter (Signed)
Diflucan order placed. Should be waiting for patient at pharmacy.

## 2015-09-29 NOTE — Telephone Encounter (Signed)
Left message informing rx has been sent in. 

## 2015-10-31 ENCOUNTER — Telehealth: Payer: Self-pay | Admitting: Family Medicine

## 2015-10-31 ENCOUNTER — Encounter: Payer: Self-pay | Admitting: Family Medicine

## 2015-10-31 ENCOUNTER — Ambulatory Visit (INDEPENDENT_AMBULATORY_CARE_PROVIDER_SITE_OTHER): Payer: Self-pay | Admitting: Family Medicine

## 2015-10-31 VITALS — BP 169/79 | HR 69 | Temp 98.0°F | Ht 67.0 in | Wt 294.0 lb

## 2015-10-31 DIAGNOSIS — Z1159 Encounter for screening for other viral diseases: Secondary | ICD-10-CM

## 2015-10-31 DIAGNOSIS — Z79899 Other long term (current) drug therapy: Secondary | ICD-10-CM

## 2015-10-31 DIAGNOSIS — N62 Hypertrophy of breast: Secondary | ICD-10-CM

## 2015-10-31 DIAGNOSIS — Z0000 Encounter for general adult medical examination without abnormal findings: Secondary | ICD-10-CM

## 2015-10-31 DIAGNOSIS — M1710 Unilateral primary osteoarthritis, unspecified knee: Secondary | ICD-10-CM

## 2015-10-31 DIAGNOSIS — R21 Rash and other nonspecific skin eruption: Secondary | ICD-10-CM

## 2015-10-31 DIAGNOSIS — I10 Essential (primary) hypertension: Secondary | ICD-10-CM

## 2015-10-31 DIAGNOSIS — Z114 Encounter for screening for human immunodeficiency virus [HIV]: Secondary | ICD-10-CM

## 2015-10-31 DIAGNOSIS — K439 Ventral hernia without obstruction or gangrene: Secondary | ICD-10-CM

## 2015-10-31 DIAGNOSIS — Z1239 Encounter for other screening for malignant neoplasm of breast: Secondary | ICD-10-CM

## 2015-10-31 LAB — BASIC METABOLIC PANEL WITH GFR
BUN: 14 mg/dL (ref 7–25)
CO2: 26 mmol/L (ref 20–31)
CREATININE: 0.82 mg/dL (ref 0.50–1.05)
Calcium: 9 mg/dL (ref 8.6–10.4)
Chloride: 106 mmol/L (ref 98–110)
GFR, Est Non African American: 80 mL/min (ref >=60)
Glucose, Bld: 101 mg/dL — ABNORMAL HIGH (ref 65–99)
Potassium: 4 mmol/L (ref 3.5–5.3)
SODIUM: 142 mmol/L (ref 135–146)

## 2015-10-31 LAB — CBC
HCT: 41.3 % (ref 36.0–46.0)
Hemoglobin: 13.7 g/dL (ref 12.0–15.0)
MCH: 30.2 pg (ref 26.0–34.0)
MCHC: 33.2 g/dL (ref 30.0–36.0)
MCV: 91.2 fL (ref 78.0–100.0)
MPV: 11.8 fL (ref 8.6–12.4)
PLATELETS: 218 10*3/uL (ref 150–400)
RBC: 4.53 MIL/uL (ref 3.87–5.11)
RDW: 13.8 % (ref 11.5–15.5)
WBC: 4.6 10*3/uL (ref 4.0–10.5)

## 2015-10-31 LAB — LIPID PANEL
CHOLESTEROL: 226 mg/dL — AB (ref 125–200)
HDL: 42 mg/dL — ABNORMAL LOW (ref >=46)
LDL Cholesterol: 164 mg/dL — ABNORMAL HIGH (ref <130)
Total CHOL/HDL Ratio: 5.4 Ratio — ABNORMAL HIGH (ref <=5.0)
Triglycerides: 98 mg/dL (ref <150)
VLDL: 20 mg/dL (ref <30)

## 2015-10-31 LAB — POCT GLYCOSYLATED HEMOGLOBIN (HGB A1C): HEMOGLOBIN A1C: 5.9

## 2015-10-31 MED ORDER — POLYETHYLENE GLYCOL 3350 17 GM/SCOOP PO POWD: 17.0000 g | Freq: Two times a day (BID) | ORAL | Status: DC | PRN

## 2015-10-31 MED ORDER — GABAPENTIN 300 MG PO CAPS: 300.0000 mg | ORAL_CAPSULE | Freq: Three times a day (TID) | ORAL | Status: DC

## 2015-10-31 NOTE — Patient Instructions (Signed)
Please take your blood pressure medications. We will check blood work today.  We will send you to a surgeon for your hernia.  Your rash is probably a mild fungal infection. Please use the ketoconazole shampoo.  Your cough is normal after a respiratory infection. You can try honey to help. You can also use flonase nasal spray to help with post nasal drip.  We will send you to a plastic surgeon to talk about breast reduction.  We will set up up to have a mammogram.  Please come back soon for your pap test.  Please come back in 2-3 months, or sooner if you need anything else.  Take care,  Dr Jimmey RalphParker

## 2015-10-31 NOTE — Assessment & Plan Note (Signed)
Rash on back most consistent with tinea versicolor. Will treat with course of ketoconazole shampoo.

## 2015-10-31 NOTE — Assessment & Plan Note (Signed)
Mammogram ordered today. Will check HIV, HCV, CBC, BMP, A1c, and lipid panel. Patient due for pap - instructed her to return soon for this.

## 2015-10-31 NOTE — Telephone Encounter (Signed)
Pt called because [facility] an appointment and they said that they will need a referral to see her. Dr Jimmey RalphParker had asked her to make the appointment and if she needed a referral let him know. jw

## 2015-10-31 NOTE — Progress Notes (Signed)
    Subjective:  Anne OchsBonnie L [name] is a 55 y.o. female who [facility] with a chief complaint of hypertension follow up.   HPI:  HTN Has been off medications since last month. Recently started back lisinopril, but did not take it today. No chest pain or shortness of breath. No orthopnea. No headache or vision changes. No obvious medication side effects.  Ventral Hernia Patient with several year history of ventral hernia related to a previous c-section and hysterectomy. Patient reports that it has become more troublesome lately and has noticed more pain when bending over. Does not have abdominal pain at rest. Has chronic constipation which is at her baseline. Patient wants to be referred to surgery to discuss management options.  Rash Patient noticed a back in the middle of her lower back 3-4 weeks ago. Rash is itchy and she had tried putting various lotions on it. Overall is getting better. No new exposures, soaps, or detergents. Has had a similar rash in the past that was treated with ketoconazole.   Cough Patient has noted a persistent cough for the past few weeks. She was diagnosed with pneumonia last month. She was treated with antibiotics but the cough has persisted. No fevers. No shortness of breath. Occasionally has thick sputum.  Macromastia Patient is interested in seeing a surgeon about breast reduction. Reports that she frequently has upper back pain due to her macromastia.   ROS: Per HPI  PMH: Smoking history reviewed.   Objective:  Physical Exam: BP 169/79 mmHg  Pulse 69  Temp(Src) 98 F (36.7 C) (Oral)  Ht 5\' 7"  (1.702 m)  Wt 294 lb (133.358 kg)  BMI 46.04 kg/m2  Gen: NAD, resting comfortably CV: RRR with no murmurs appreciated Pulm: NWOB, CTAB with no crackles, wheezes, or rhonchi GI: Obese, Normal bowel sounds present. 15cm diameter hernia noted in LLQ. Easily reducible without pain.  MSK: no edema, cyanosis, or clubbing noted Skin: Hyperpigmented  5x15cm rash on central aspect of lumbar pack with fine overlying scale. Neuro: grossly normal, moves all extremities Psych: Normal affect and thought content  Results for orders placed or performed in visit on 10/31/15 (from the past 72 hour(s))  POCT glycosylated hemoglobin (Hb A1C)     Status: None   Collection Time: 10/31/15 10:29 AM  Result Value Ref Range   Hemoglobin A1C 5.9    Assessment/Plan:  HYPERTENSION, BENIGN SYSTEMIC Elevated today though patient did not take her antihypertensives. Patient has been off of her medications for several weeks. Will restart lisinopril today. Will check BMP.   Ventral hernia Easily reducible today without signs of obstruction. Will refer to surgery for management.   Rash Rash on back most consistent with tinea versicolor. Will treat with course of ketoconazole shampoo.   Macromastia Patient with symptoms affecting her quality of life including upper back pain and intertrigo. Will refer to plastic surgery.   Healthcare maintenance Mammogram ordered today. Will check HIV, HCV, CBC, BMP, A1c, and lipid panel. Patient due for pap - instructed her to return soon for this.   Cough Likely post infectious cough. Discussed typical course with patient. No indication for further intervention at this time. If continues to persist for several months, can consider further management including treatment for postnasal drip, GERD, etc in addition to obtaining a chest plain film.   Katina Degreealeb M. Jimmey RalphParker, MD [facility] Resident PGY-2 10/31/2015 11:59 AM

## 2015-10-31 NOTE — Assessment & Plan Note (Signed)
Easily reducible today without signs of obstruction. Will refer to surgery for management.

## 2015-10-31 NOTE — Assessment & Plan Note (Signed)
Patient with symptoms affecting her quality of life including upper back pain and intertrigo. Will refer to plastic surgery.

## 2015-10-31 NOTE — Telephone Encounter (Signed)
Referral placed.  Anne Degreealeb M. Jimmey RalphParker, MD [facility] Resident PGY-2 10/31/2015 2:37 PM

## 2015-10-31 NOTE — Telephone Encounter (Signed)
Pt called because she called [facility] to make an appointment and they said that they will need a referral to see her. Dr Parker had asked her to make the appointment and if she needed a referral let him know. jw °

## 2015-10-31 NOTE — Telephone Encounter (Signed)
Will forward to Dr. Jimmey RalphParker to place referral. Burnard HawthorneJazmin Hartsell,CMA

## 2015-10-31 NOTE — Assessment & Plan Note (Signed)
Elevated today though patient did not take her antihypertensives. Patient has been off of her medications for several weeks. Will restart lisinopril today. Will check BMP.

## 2015-11-01 ENCOUNTER — Telehealth: Payer: Self-pay | Admitting: Family Medicine

## 2015-11-01 LAB — HEPATITIS C ANTIBODY: HCV AB: NEGATIVE

## 2015-11-01 LAB — HIV ANTIBODY (ROUTINE TESTING W REFLEX): HIV: NONREACTIVE

## 2015-11-01 NOTE — Telephone Encounter (Signed)
LMOVM. Requesting pt calls me back. [facility]. I would like to know if she would prefer to go to [facility] who accepts [facility], instead of going to [facility] and having to pay out of pocket.

## 2015-11-02 ENCOUNTER — Other Ambulatory Visit: Payer: Self-pay

## 2015-11-02 ENCOUNTER — Ambulatory Visit: Payer: Self-pay | Admitting: Family Medicine

## 2015-11-02 DIAGNOSIS — Z1231 Encounter for screening mammogram for malignant neoplasm of breast: Secondary | ICD-10-CM

## 2015-11-03 ENCOUNTER — Ambulatory Visit: Payer: Self-pay

## 2015-11-07 ENCOUNTER — Encounter: Payer: Self-pay | Admitting: Family Medicine

## 2015-11-15 ENCOUNTER — Ambulatory Visit: Payer: Self-pay

## 2015-11-18 LAB — GLUCOSE, POCT (MANUAL RESULT ENTRY): POC GLUCOSE: 113 mg/dL — AB (ref 70–99)

## 2015-12-19 ENCOUNTER — Ambulatory Visit
Admission: RE | Admit: 2015-12-19 | Discharge: 2015-12-19 | Disposition: A | Discharge status: Home / Self Care | Payer: No Typology Code available for payment source | Source: Ambulatory Visit

## 2015-12-19 ENCOUNTER — Ambulatory Visit: Payer: Self-pay

## 2015-12-19 DIAGNOSIS — Z1231 Encounter for screening mammogram for malignant neoplasm of breast: Secondary | ICD-10-CM

## 2016-03-28 ENCOUNTER — Ambulatory Visit: Payer: No Typology Code available for payment source | Attending: Internal Medicine

## 2016-03-28 MED FILL — IBUPROFEN 800 MG TABLET: 800 | 7 days supply | Qty: 30 | Fill #0

## 2016-03-28 MED FILL — AMOXICILLIN 500 MG CAPSULE: 500 | 1 days supply | Qty: 4 | Fill #0

## 2016-03-29 MED FILL — GABAPENTIN 300 MG CAPSULE: 300 | 30 days supply | Qty: 90 | Fill #0

## 2016-03-30 MED FILL — CHLORHEXIDINE 0.12% RINSE: 0.12 | 1 days supply | Qty: 473 | Fill #0

## 2016-03-30 MED FILL — PROMETHAZINE 25 MG TABLET: 25 | 2 days supply | Qty: 12 | Fill #0

## 2016-04-03 MED FILL — HYDROCODON-APAP 5-325: 5-325 | 4 days supply | Qty: 20 | Fill #0

## 2016-04-03 MED FILL — PROMETHAZINE 25 MG TABLET: 25 | 3 days supply | Qty: 15 | Fill #0

## 2016-04-17 ENCOUNTER — Ambulatory Visit (INDEPENDENT_AMBULATORY_CARE_PROVIDER_SITE_OTHER): Payer: No Typology Code available for payment source | Admitting: Family Medicine

## 2016-04-17 ENCOUNTER — Encounter: Payer: Self-pay | Admitting: Family Medicine

## 2016-04-17 VITALS — BP 152/84 | HR 69 | Temp 98.1°F | Wt 289.0 lb

## 2016-04-17 DIAGNOSIS — R1900 Intra-abdominal and pelvic swelling, mass and lump, unspecified site: Secondary | ICD-10-CM

## 2016-04-17 DIAGNOSIS — R21 Rash and other nonspecific skin eruption: Secondary | ICD-10-CM

## 2016-04-17 DIAGNOSIS — I10 Essential (primary) hypertension: Secondary | ICD-10-CM

## 2016-04-17 DIAGNOSIS — R1904 Left lower quadrant abdominal swelling, mass and lump: Secondary | ICD-10-CM

## 2016-04-17 MED ORDER — KETOCONAZOLE 2 % EX SHAM: MEDICATED_SHAMPOO | CUTANEOUS | 0 refills | Status: DC

## 2016-04-17 MED ORDER — LISINOPRIL 10 MG PO TABS: 10.0000 mg | ORAL_TABLET | Freq: Every day | ORAL | 5 refills | Status: DC

## 2016-04-17 NOTE — Progress Notes (Signed)
   Subjective:  [name] is a 55 y.o. female who [facility] with a chief complaint of abdominal mass.    HPI:  Abdominal Mass Patient with several month history of mass in her left lower quadrant. States that this mass is different than her ventral hernia. Mass is non-painful and is about the size of an egg. The mass comes and goes and she cannot always feel it. Thinks that it is sometimes easier to feel after eating. No nausea or vomiting. No bloating. Patient has a history of uterine fibroids and an ovarian tumor. She has had a hysterectomy and oopherectomy in the past (in 2010). No recent imaging done of that area. Has lost about 5 pounds over the past several months.   Rash Patient with erythematous, scaly, burning rash across her chest into her axilla bilaterally. Started about 4 weeks ago. Intermittent in nature. Has tried several therapies including nystatin powder and hydrocortisone which have not helped. Has had a similar rash in the past with resolved with diflucan. Has not tried ketoconazole.   Hypertension BP Readings from Last 3 Encounters:  04/17/16 (!) 152/84  11/18/15 (!) 158/84  10/31/15 (!) 169/79   Home BP monitoring-No Compliant with medications-No ROS-Denies any CP, HA, SOB, blurry vision, LE edema, transient weakness, orthopnea, PND.   ROS: Per HPI  PMH: Smoking history reviewed.    Objective:  Physical Exam: BP (!) 152/84   Pulse 69   Temp 98.1 F (36.7 C) (Oral)   Wt 289 lb (131.1 kg)   SpO2 97%   BMI 45.26 kg/m   Gen: NAD, resting comfortably CV: RRR with no murmurs appreciated Pulm: NWOB, CTAB with no crackles, wheezes, or rhonchi GI: Exam limited due to body habitus. Normal bowel sounds present. Soft. Nontender. Nondistended. Large vental hernia noted. Reducible. No mass palpated in LLQ.  MSK: no edema, cyanosis, or clubbing noted Skin: warm, dry Neuro: grossly normal, moves all extremities Psych: Normal affect and thought  content  Assessment/Plan:  Abdominal mass, left lower quadrant Not able to be palpated today. Given history of ovarian tumor, will obtain CT abdomen/pelvis with contrast.   Rash Most consistent with tinea versicolor. Will treat with 2 week course of ketokonazole. Return precautions reviewed.   HYPERTENSION, BENIGN SYSTEMIC Above goal today, though patient has not been taking lisinopril. Will restart today. Instructed patient to follow up in 1-2 weeks for recheck BMP and BP.    Katina Degreealeb M. Jimmey RalphParker, MD [facility] Resident PGY-3 04/17/2016 12:20 PM

## 2016-04-17 NOTE — Assessment & Plan Note (Signed)
Not able to be palpated today. Given history of ovarian tumor, will obtain CT abdomen/pelvis with contrast.

## 2016-04-17 NOTE — Assessment & Plan Note (Signed)
Most consistent with tinea versicolor. Will treat with 2 week course of ketokonazole. Return precautions reviewed.

## 2016-04-17 NOTE — Patient Instructions (Signed)
We will get a CT scan of your abdomen.  We will restart the lisinopril today. Please come back in a couple weeks to check your blood work.  I think your rash is due to a condition called tinea versicolor. Please use the ketoconazole shampoo once a day for 2 weeks.  Please come back to see me in a few months.  Take care,  Dr Jimmey RalphParker

## 2016-04-17 NOTE — Assessment & Plan Note (Signed)
Above goal today, though patient has not been taking lisinopril. Will restart today. Instructed patient to follow up in 1-2 weeks for recheck BMP and BP.

## 2016-04-26 ENCOUNTER — Ambulatory Visit
Admission: RE | Admit: 2016-04-26 | Discharge: 2016-04-26 | Disposition: A | Discharge status: Home / Self Care | Payer: No Typology Code available for payment source | Source: Ambulatory Visit | Attending: Family Medicine | Admitting: Family Medicine

## 2016-04-26 DIAGNOSIS — R1900 Intra-abdominal and pelvic swelling, mass and lump, unspecified site: Secondary | ICD-10-CM

## 2016-04-26 MED ORDER — IOPAMIDOL (ISOVUE-300) INJECTION 61%
125.0000 mL | Freq: Once | INTRAVENOUS | Status: AC | PRN
  Administered 2016-04-26: 125 mL via INTRAVENOUS

## 2016-04-30 ENCOUNTER — Telehealth: Payer: Self-pay | Admitting: Family Medicine

## 2016-04-30 NOTE — Telephone Encounter (Signed)
Called patient to discuss CT results. Results show hernia, but otherwise normal. No answer. Left VM with call back number.  Katina Degreealeb M. Jimmey RalphParker, MD [facility] Resident PGY-3 04/30/2016 12:05 PM

## 2016-04-30 NOTE — Telephone Encounter (Signed)
Pt returned drs call °

## 2016-04-30 NOTE — Telephone Encounter (Signed)
She has referrals in place to both general surgery and plastic surgery. She should call to schedule an appointment with each of those offices to discuss her surgical options for her hernia and breast reduction.  Katina Degreealeb M. Jimmey RalphParker, MD [facility] Resident PGY-3 04/30/2016 3:16 PM

## 2016-04-30 NOTE — Telephone Encounter (Signed)
Spoke with patient and she is aware of results and would like to move forward with surgery.  She would also like to see a surgeon for breast reduction.  I informed her that I was unsure if financial assistance covers that type of procedure but that I would let the provider know. Jazmin Hartsell,CMA

## 2016-04-30 NOTE — Telephone Encounter (Signed)
LM for patient per her request regarding referrals.  Per Tia no plastic surgery is available for GCCN/[facility]FA.  I also informed patient that she would need to schedule a financial counseling appt soon since hers expired in august. [facility] Peyten Punches,CMA

## 2016-05-01 ENCOUNTER — Other Ambulatory Visit: Payer: No Typology Code available for payment source

## 2016-05-01 ENCOUNTER — Telehealth: Payer: Self-pay | Admitting: Family Medicine

## 2016-05-01 NOTE — Telephone Encounter (Signed)
I don't see a clear reason for her to be referred to gastroenterology. If she is concerned about her hernia, she should call the surgeon. This referral has already been placed.  Anne Degreealeb M. Jimmey RalphParker, MD [facility] Resident PGY-3 05/01/2016 2:57 PM

## 2016-05-01 NOTE — Telephone Encounter (Signed)
Patient came by office scanned orange card. Patient elig. 03/28/16- 07/04/16 with $30.00 co-pay. Patient request  Referral appt. For GI Dr.  Please let patient know.

## 2016-05-01 NOTE — Telephone Encounter (Signed)
LM for patient to call back and clarify her request for GI referral. Collan Schoenfeld,CMA

## 2016-08-14 ENCOUNTER — Telehealth: Payer: Self-pay | Admitting: Internal Medicine

## 2016-08-14 NOTE — Telephone Encounter (Signed)
**  After Hours/ Emergency Line Call*  Received a call to report that [name]. Reports of cold for the past week but now has productive cough that is yellow and dark green. Reports of sore throat. Reports of fever to 101F on Sunday. Reports of sob after coughing but not at rest. Made a same day appointment for 12/27 at 8:30AM with Dr. Earlene PlaterWallace.  Red flags discussed.  Will forward to PCP.  Palma HolterKanishka G Gunadasa, MD PGY-2, [facility]

## 2016-08-15 ENCOUNTER — Encounter: Payer: Self-pay | Admitting: Internal Medicine

## 2016-08-15 ENCOUNTER — Ambulatory Visit (INDEPENDENT_AMBULATORY_CARE_PROVIDER_SITE_OTHER): Payer: Self-pay | Admitting: Internal Medicine

## 2016-08-15 ENCOUNTER — Telehealth: Payer: Self-pay | Admitting: *Deleted

## 2016-08-15 ENCOUNTER — Ambulatory Visit
Admission: RE | Admit: 2016-08-15 | Discharge: 2016-08-15 | Disposition: A | Discharge status: Home / Self Care | Payer: No Typology Code available for payment source | Source: Ambulatory Visit | Attending: Family Medicine | Admitting: Family Medicine

## 2016-08-15 VITALS — BP 158/82 | HR 87 | Temp 98.6°F | Ht 67.0 in | Wt 291.0 lb

## 2016-08-15 DIAGNOSIS — R05 Cough: Secondary | ICD-10-CM

## 2016-08-15 DIAGNOSIS — R059 Cough, unspecified: Secondary | ICD-10-CM

## 2016-08-15 DIAGNOSIS — R0981 Nasal congestion: Secondary | ICD-10-CM

## 2016-08-15 MED ORDER — BENZONATATE 100 MG PO CAPS: 200.0000 mg | ORAL_CAPSULE | Freq: Three times a day (TID) | ORAL | 0 refills | Status: DC | PRN

## 2016-08-15 MED ORDER — AMOXICILLIN-POT CLAVULANATE 875-125 MG PO TABS
1.0000 | ORAL_TABLET | Freq: Two times a day (BID) | ORAL | 0 refills | Status: DC
Start: 2016-08-15 — End: 2016-09-25

## 2016-08-15 NOTE — Progress Notes (Signed)
   Subjective:    [name] Yi - 55 y.o. female MRN [id number]  Date of birth: 1960-10-14  HPI  [name] Leth is here for SDA for cough and cold symptoms.  Patient started having cough approximately 2 weeks ago. Cough has worsened and has now become productive with green sputum. She endorses significant facial pressure and purulent nasal drainage for the past two weeks. Symptoms worsened over the weekend. Fever to 101 at home 2-3 days ago. Endorses SOB with coughing spells but none at rest or with exertion. Has recently started having some pink tinged sputum with coughing. Denies ear pain or sore throat. No nausea, vomiting or diarrhea. No LE edema. No chest pain. No known sick contacts. Has been taking Mucinex, Dayquil, Nyquil, and cough drops without improvement. Has also been taking NSAIDs and tylenol for facial pressure.   -  reports that she has never smoked. She has never used smokeless tobacco. - Review of Systems: Per HPI. - Past Medical History: Patient Active Problem List   Diagnosis Date Noted  . Sinus congestion 08/15/2016  . Ventral hernia 10/31/2015  . Healthcare maintenance 10/31/2015  . Left arm pain 07/19/2015  . Flashing lights 04/07/2015  . Blurry vision, left eye 04/07/2015  . Stress and adjustment reaction 03/25/2015  . Rash 01/07/2015  . Vertigo 02/24/2014  . Macromastia 12/19/2013  . Arthritis of knee 10/06/2013  . Hair loss 09/07/2013  . Leg cramping 01/29/2013  . Allergic rhinitis 01/04/2012  . Abdominal mass, left lower quadrant 06/18/2011  . Inflammatory arthritis 12/26/2010  . OBESITY, MORBID 09/26/2010  . GERD 10/18/2009  . BACK PAIN, CHRONIC 05/14/2008  . SLEEP APNEA, OBSTRUCTIVE, SEVERE 11/10/2006  . HYPERTENSION, BENIGN SYSTEMIC 10/17/2006  . OSTEOARTHRITIS, LOWER LEG 10/17/2006   - Medications: reviewed and updated   Objective:   Physical Exam BP (!) 158/82   Pulse 87   Temp 98.6 F (37 C) (Oral)   Ht 5\' 7"  (1.702 m)   Wt 291 lb  (132 kg)   SpO2 99%   BMI 45.58 kg/m  Gen: NAD, alert, cooperative with exam, appears sick but non-toxic  HEENT: NCAT, PERRL, clear conjunctiva, oropharynx erythematous but without tonsillar exudates, TM obstructed bilaterally with cerumen, significant TTP over the maxillary sinuses bilaterally, no TTP over the frontal sinuses  CV: RRR, good S1/S2, no murmur, no LE edema, no TTP of calves bilaterally  Resp: coughing with any deep breaths, lungs sound clear but patient hesitant to take deep inspiration secondary to coughing fits, no increased WOB or tachypnea, speaks in full sentences      Assessment & Plan:   Sinus congestion Suspected that significant nasal drainage, cough, and facial pressure related to bacterial sinusitis. However, given prolonged significant cough with fevers and inability to perform a thorough lung exam, sent patient for CXR to r/o PNA. CXR was negative so will proceed with treatment of acute bacterial sinusitis with Augmentin. Tessalon given for cough. Blood tinged sputum could be concerning for PE but reassured by lack of physical exam findings for DVT, no hypoxia, and no tachycardia on exam.     Marcy Sirenatherine Kamoni Depree, D.O. 08/15/2016, 10:19 AM PGY-2, [facility]

## 2016-08-15 NOTE — Patient Instructions (Signed)
I will call you with the results of the Chest X-ray and let you know what treatment plan to proceed with.   Hope you feel better soon!   Take Care,   Dr. Earlene PlaterWallace

## 2016-08-15 NOTE — Telephone Encounter (Signed)
Pt informed. [name], CMA  

## 2016-08-15 NOTE — Telephone Encounter (Signed)
LVM for pt to call back to inform her of below. Zimmerman Rumple, April D, CMA  

## 2016-08-15 NOTE — Assessment & Plan Note (Signed)
Suspected that significant nasal drainage, cough, and facial pressure related to bacterial sinusitis. However, given prolonged significant cough with fevers and inability to perform a thorough lung exam, sent patient for CXR to r/o PNA. CXR was negative so will proceed with treatment of acute bacterial sinusitis with Augmentin. Tessalon given for cough. Blood tinged sputum could be concerning for PE but reassured by lack of physical exam findings for DVT, no hypoxia, and no tachycardia on exam.

## 2016-08-15 NOTE — Telephone Encounter (Signed)
-----   Message from Arvilla Marketatherine Lauren Wallace, DO sent at 08/15/2016 10:07 AM EST ----- Please call patient and let her know chest x-ray was negative for pneumonia. Will send Rx for Augmentin to treat bacterial sinus infection. Will also send mediation to help with the cough. She should follow up if her symptoms continue despite antibiotic treatment.

## 2016-08-16 MED FILL — BENZONATATE 100 MG CAPSULE: 100 | 5 days supply | Qty: 30 | Fill #0

## 2016-08-16 MED FILL — AMOX-CLAV 875-125 MG TABLET: 875-125 | 7 days supply | Qty: 14 | Fill #0

## 2016-09-24 ENCOUNTER — Telehealth: Payer: Self-pay | Admitting: Family Medicine

## 2016-09-24 NOTE — Telephone Encounter (Signed)
**  After Hours/ Emergency Line Call*  Received a call to report that Geralynn OchsBonnie L Lawhorne has been having swollen lymph nodes, vomiting, diarrhea, and body aches that started 2 days ago. Patient states she is doing pretty well with drinking fluids. She has also been taking Theraflu. Patient requested to be seen in same day clinic tomorrow. Appointment scheduled with Dr. Jimmey RalphParker (who is also her PCP).  Red flags discussed. We also discussed reasons to go to the ED tonight, including inability to keep down liquids. Will forward to PCP.  Hilton SinclairKaty D Caidan Hubbert, MD PGY-2, [facility]

## 2016-09-25 ENCOUNTER — Ambulatory Visit (INDEPENDENT_AMBULATORY_CARE_PROVIDER_SITE_OTHER): Payer: Self-pay | Admitting: Family Medicine

## 2016-09-25 ENCOUNTER — Encounter: Payer: Self-pay | Admitting: Family Medicine

## 2016-09-25 VITALS — BP 120/74 | HR 114 | Temp 99.0°F | Wt 283.0 lb

## 2016-09-25 DIAGNOSIS — R6889 Other general symptoms and signs: Secondary | ICD-10-CM

## 2016-09-25 MED ORDER — PROMETHAZINE HCL 25 MG PO TABS: 25.0000 mg | ORAL_TABLET | Freq: Three times a day (TID) | ORAL | 0 refills | Status: DC | PRN

## 2016-09-25 MED ORDER — OSELTAMIVIR PHOSPHATE 6 MG/ML PO SUSR: 75.0000 mg | Freq: Two times a day (BID) | ORAL | 0 refills | Status: DC

## 2016-09-25 NOTE — Progress Notes (Signed)
    Subjective:  Geralynn OchsBonnie L Bergdoll is a 56 y.o. female who [facility] to the [facility] today with a chief complaint of flu like symptoms.   HPI:  Flu-like Symptoms Started about 2 days ago. Symptoms include cough, sore throat, sneeze, fever, nausea, vomiting, and some diarrhea. Also swollen lymph nodes on the left side of her neck. She has been taking tylenol and alka-seltzer which have helped some. She has been able to tolerate some PO fluids. She has been around both of her grandchildren over the last couple of days who have had the flu.   ROS: Per HPI  PMH: Smoking history reviewed.   Objective:  Physical Exam: BP 120/74   Pulse (!) 114   Temp 99 F (37.2 C) (Oral)   Wt 283 lb (128.4 kg)   SpO2 98%   BMI 44.32 kg/m   Gen: NAD, resting comfortably HEENT: MMM. LAD in anterior left cervical chain noted.  CV: RRR with no murmurs appreciated Pulm: NWOB, CTAB with no crackles, wheezes, or rhonchi MSK: no edema, cyanosis, or clubbing noted Skin: warm, dry Neuro: grossly normal, moves all extremities Psych: Normal affect and thought content  Assessment/Plan:  Flu-Like Symptoms Given that symptoms started less than 48 hours ago, will start empiric treatment with tamiflu. Will also give phenergan for nausea. Patient able to tolerate some PO while in clinic. Return precautions reviewed. Follow up as needed.   Katina Degreealeb M. Jimmey RalphParker, MD [facility] Resident PGY-3 09/25/2016 11:00 AM

## 2016-09-25 NOTE — Patient Instructions (Signed)
Take the tamiflu.  Use the phenergan as needed.  Let us know if you aren't feeling better.  Take care,  Dr Jimmey RalphParker

## 2016-10-02 ENCOUNTER — Other Ambulatory Visit: Payer: Self-pay | Admitting: *Deleted

## 2016-10-02 MED ORDER — GABAPENTIN 300 MG PO CAPS: 300.0000 mg | ORAL_CAPSULE | Freq: Three times a day (TID) | ORAL | 0 refills | Status: DC

## 2016-10-02 NOTE — Telephone Encounter (Signed)
[facility]  Fax placed in provider box.  Clovis PuMartin, Ronte Parker L, RN

## 2016-10-20 ENCOUNTER — Telehealth: Payer: Self-pay | Admitting: Obstetrics and Gynecology

## 2016-10-20 NOTE — Telephone Encounter (Signed)
Family Medicine Emergency Line Telephone Note   Patient calls stating that she is still feeling sick. She was diagnosed with the flu in early February. COmpleted course of Tamiflu. However states she still does not feel much better. Continues to have swollen lymph nodes and increased fatigue. She thinks she may have another kind of infection. Denies any known fevers. States she is coughing of dark red phlegm in the morning. Denies SOB or chest pain.   Patient wanted to be scheduled for SDA on Monday. Appointment made. Patient seems stable until appointment. Discussed signs that she needs to come to ED if worsening.   Patient voiced understanding and agreed to plan.   Caryl AdaJazma Keyetta Hollingworth, DO 10/20/2016, 3:27 PM PGY-3, [facility]

## 2016-10-22 ENCOUNTER — Encounter: Payer: Self-pay | Admitting: Family Medicine

## 2016-10-22 ENCOUNTER — Ambulatory Visit (INDEPENDENT_AMBULATORY_CARE_PROVIDER_SITE_OTHER): Payer: No Typology Code available for payment source | Admitting: Family Medicine

## 2016-10-22 VITALS — BP 144/80 | HR 78 | Temp 97.7°F | Wt 282.0 lb

## 2016-10-22 DIAGNOSIS — R05 Cough: Secondary | ICD-10-CM

## 2016-10-22 DIAGNOSIS — R21 Rash and other nonspecific skin eruption: Secondary | ICD-10-CM

## 2016-10-22 DIAGNOSIS — R059 Cough, unspecified: Secondary | ICD-10-CM

## 2016-10-22 MED ORDER — AZITHROMYCIN 250 MG PO TABS: ORAL_TABLET | ORAL | 0 refills | Status: AC

## 2016-10-22 MED ORDER — IPRATROPIUM BROMIDE 0.06 % NA SOLN: 2.0000 | Freq: Four times a day (QID) | NASAL | 12 refills | Status: DC

## 2016-10-22 MED ORDER — FLUCONAZOLE 150 MG PO TABS: 150.0000 mg | ORAL_TABLET | ORAL | 0 refills | Status: AC

## 2016-10-22 MED FILL — GABAPENTIN 300 MG CAPSULE: 300 | 30 days supply | Qty: 90 | Fill #0

## 2016-10-22 MED FILL — IPRATROPIUM 0.06% SPRAY: 0.06 | 30 days supply | Qty: 15 | Fill #0

## 2016-10-22 MED FILL — FLUCONAZOLE 150 MG TABLET: 150 | 6 days supply | Qty: 3 | Fill #0

## 2016-10-22 MED FILL — AZITHROMYCIN 250 MG TABLET: 250 | 5 days supply | Qty: 6 | Fill #0

## 2016-10-22 NOTE — Assessment & Plan Note (Signed)
No response to ketoconazole. Will treat with diflucan.

## 2016-10-22 NOTE — Progress Notes (Signed)
    Subjective:  [name] is a 56 y.o. female who [facility] the [facility] today with a chief complaint of cough.   HPI:  Cough Patient diagnosed with the flu in early February and treated with a course of tamiflu. She was better for about a week, then she had a return of her cough. Associated symptoms include chills, sore throat, malaise, decreased appetite and swollen lymph nodes. Her cough is productive of thick sputum that can sometimes be brownish and red. She has not tried any medications. No sick contacts.   Tinea Versicolor Did not respond to topical ketoconazole. Patient requesting course of diflucan.   ROS: Per HPI  PMH: Smoking history reviewed.   Objective:  Physical Exam: BP (!) 144/80   Pulse 78   Temp 97.7 F (36.5 C) (Oral)   Wt 282 lb (127.9 kg)   SpO2 98%   BMI 44.17 kg/m   Gen: NAD, resting comfortably HEENT: TM clear bilaterally. MMM. OP clear. Shotty LAD.  CV: RRR with no murmurs appreciated Pulm: NWOB, bibasilar crackles noted without wheezes.  MSK: no edema, cyanosis, or clubbing noted Skin: warm, dry Neuro: grossly normal, moves all extremities Psych: Normal affect and thought content  Assessment/Plan:  Cough Concern for post influzena infection vs atypical pneumonia vs bronchitis. Doubt lobular pneumonia given that patient is afebrile and appears well clinically. Will treat with z pack given that symptoms have been persistent for a few weeks. Will check CXR. Will also start atrovent nasal spray. Return precautions reviewed. Follow up in 1 week if symptoms worsen or do not improve.  Rash No response to ketoconazole. Will treat with diflucan.   Katina Degreealeb M. Jimmey RalphParker, MD [facility] Resident PGY-3 10/22/2016 10:44 AM

## 2016-10-22 NOTE — Patient Instructions (Signed)
Start the z pack  Start the diflucan.  Come back to see me in 1-2 weeks if not improving.  Take care,  Dr Jimmey RalphParker

## 2016-12-05 ENCOUNTER — Ambulatory Visit
Admission: RE | Admit: 2016-12-05 | Discharge: 2016-12-05 | Disposition: A | Discharge status: Home / Self Care | Payer: Managed Care, Other (non HMO) | Source: Ambulatory Visit | Attending: Family Medicine | Admitting: Family Medicine

## 2016-12-05 ENCOUNTER — Ambulatory Visit: Payer: Self-pay | Admitting: Internal Medicine

## 2016-12-05 ENCOUNTER — Encounter: Payer: Self-pay | Admitting: Family Medicine

## 2016-12-05 DIAGNOSIS — R059 Cough, unspecified: Secondary | ICD-10-CM

## 2016-12-05 DIAGNOSIS — R05 Cough: Secondary | ICD-10-CM

## 2016-12-12 ENCOUNTER — Ambulatory Visit: Payer: Managed Care, Other (non HMO) | Admitting: Family Medicine

## 2016-12-12 ENCOUNTER — Encounter: Payer: Self-pay | Admitting: Family Medicine

## 2016-12-14 ENCOUNTER — Ambulatory Visit: Payer: Managed Care, Other (non HMO) | Admitting: Internal Medicine

## 2016-12-25 ENCOUNTER — Ambulatory Visit (INDEPENDENT_AMBULATORY_CARE_PROVIDER_SITE_OTHER): Payer: Managed Care, Other (non HMO) | Admitting: Family Medicine

## 2016-12-25 ENCOUNTER — Encounter: Payer: Self-pay | Admitting: Family Medicine

## 2016-12-25 VITALS — BP 152/81 | HR 77 | Temp 98.5°F | Ht 67.0 in | Wt 287.0 lb

## 2016-12-25 DIAGNOSIS — L304 Erythema intertrigo: Secondary | ICD-10-CM

## 2016-12-25 DIAGNOSIS — K439 Ventral hernia without obstruction or gangrene: Secondary | ICD-10-CM | POA: Diagnosis not present

## 2016-12-25 DIAGNOSIS — E669 Obesity, unspecified: Secondary | ICD-10-CM | POA: Diagnosis not present

## 2016-12-25 DIAGNOSIS — I10 Essential (primary) hypertension: Secondary | ICD-10-CM | POA: Diagnosis not present

## 2016-12-25 DIAGNOSIS — R739 Hyperglycemia, unspecified: Secondary | ICD-10-CM

## 2016-12-25 DIAGNOSIS — J309 Allergic rhinitis, unspecified: Secondary | ICD-10-CM

## 2016-12-25 DIAGNOSIS — R300 Dysuria: Secondary | ICD-10-CM | POA: Diagnosis not present

## 2016-12-25 DIAGNOSIS — B360 Pityriasis versicolor: Secondary | ICD-10-CM

## 2016-12-25 HISTORY — DX: Erythema intertrigo: L30.4

## 2016-12-25 LAB — POCT URINALYSIS DIP (MANUAL ENTRY)
Bilirubin, UA: NEGATIVE
Glucose, UA: NEGATIVE mg/dL
LEUKOCYTES UA: NEGATIVE
NITRITE UA: NEGATIVE
PROTEIN UA: NEGATIVE mg/dL
RBC UA: NEGATIVE
SPEC GRAV UA: 1.02 (ref 1.010–1.025)
UROBILINOGEN UA: 1 U/dL
pH, UA: 7 (ref 5.0–8.0)

## 2016-12-25 LAB — POCT GLYCOSYLATED HEMOGLOBIN (HGB A1C): Hemoglobin A1C: 5.9

## 2016-12-25 MED ORDER — NYSTATIN 100000 UNIT/GM EX POWD: Freq: Four times a day (QID) | CUTANEOUS | 0 refills | Status: DC

## 2016-12-25 MED ORDER — FLUTICASONE PROPIONATE 50 MCG/ACT NA SUSP: 2.0000 | Freq: Every day | NASAL | 6 refills | Status: DC

## 2016-12-25 MED ORDER — GABAPENTIN 300 MG PO CAPS: 300.0000 mg | ORAL_CAPSULE | Freq: Three times a day (TID) | ORAL | 0 refills | Status: DC

## 2016-12-25 MED ORDER — CLOTRIMAZOLE 1 % EX CREA: 1.0000 "application " | TOPICAL_CREAM | Freq: Two times a day (BID) | CUTANEOUS | 0 refills | Status: DC

## 2016-12-25 MED FILL — FLUTICASONE PROP 50 MCG SPR: 50 | 30 days supply | Qty: 16 | Fill #0

## 2016-12-25 MED FILL — NYSTOP 100,000 UNITS/GM PWD: 100000 | 10 days supply | Qty: 15 | Fill #0

## 2016-12-25 MED FILL — GABAPENTIN 300 MG CAPSULE: 300 | 30 days supply | Qty: 90 | Fill #0

## 2016-12-25 NOTE — Assessment & Plan Note (Signed)
Given [facility], will try clotrimazole. If not responding, consider another round of diflucan vs a non-azole topical antifungal.

## 2016-12-25 NOTE — Assessment & Plan Note (Signed)
Like the source of patient's cough. No red flag signs or symptoms. Will restart flonase.

## 2016-12-25 NOTE — Progress Notes (Signed)
   Subjective:  [name] is a 56 y.o. female who [facility] today with a chief complaint of Obesity.   HPI:  Obesity Patient with about a 5 pound weight gain over the past month. She says she has not changed her diet, though is still frequently drinking mountain dew. Says that she knows she should drinking it, but does because of stress and it makes her feel better. She would like some assistance with weight loss.  Cough Patient also complains of morning cough. She does not have day time symptoms. Cough is productive of yellow phlegm and only occurs early in the morning. No fevers. No chills. No weight loss. No treatments tried.  Dysuria  Symptoms started several weeks ago. No treatments tried. No fevers. No back pain. Urine occasionally has a strong odor.   Ventral Hernia Persistent for several years. She had an abdominal CT performed last year that confirmed ventral hernia. Currently has some pain in the area.   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 She has had to have doses of diflucan in the past for this. Currently her intertrigo is well controlled, but she is having some issues with her facial rash. She tried the ketoconazole which did not significantly seem to help.   ROS: Per HPI  PMH: Smoking history reviewed.    Objective:  Physical Exam: BP (!) 152/81   Pulse 77   Temp 98.5 F (36.9 C) (Oral)   Ht 5\' 7"  (1.702 m)   Wt 287 lb (130.2 kg)   SpO2 97%   BMI 44.95 kg/m   Gen: NAD, resting comfortably CV: RRR with no murmurs appreciated Pulm: NWOB, CTAB with no crackles, wheezes, or rhonchi GI: Obese, large ventral hernia noted. Normal bowel sounds present. Nontender. Nondistended.  MSK: no edema, cyanosis, or clubbing noted Skin: Hyperpigmented intertrigenous rash noted in axilla and under breasts bilaterally. Erythematous patches on cheeks bilaterally with overlying thick scale. Neuro: grossly  normal, moves all extremities Psych: Normal affect and thought content  No results found for this or any previous visit (from the past 72 hour(s)).   Assessment/Plan:  OBESITY, MORBID Counseled on lifestyle modifications. Referral to Dr Gerilyn PilgrimSykes placed.   Allergic rhinitis Like the source of patient's cough. No red flag signs or symptoms. Will restart flonase.   Ventral hernia No red flag signs or symptoms. Will place referral to surgery.   Tinea versicolor Given lack of response to [facility], will try clotrimazole. If not responding, consider another round of diflucan vs a non-azole topical antifungal.   Intertrigo Encouraged weight loss. Nystatin powder represcribed today.   Dysuria UA negative. Discussed with patient who deferred further work up as this point. Return precautions reviewed.   Katina Degreealeb M. Jimmey RalphParker, MD [facility] Resident PGY-3 12/25/2016 4:33 PM

## 2016-12-25 NOTE — Patient Instructions (Addendum)
Your urine test was normal.  We will refer you to the nutritionist. Please call dr Gerilyn Pilgrimsykes to set it up.  I put in a referral to the surgeon.  I sent in nystatin and clotrimazole for your rash. Let me know if I need to send in diflucan.   We will check blood work.  Come back in 3-6 months.  Take care,  Dr Jimmey RalphParker

## 2016-12-25 NOTE — Assessment & Plan Note (Signed)
Encouraged weight loss. Nystatin powder represcribed today.

## 2016-12-25 NOTE — Assessment & Plan Note (Signed)
Counseled on lifestyle modifications. Referral to Dr Gerilyn PilgrimSykes placed.

## 2016-12-25 NOTE — Assessment & Plan Note (Signed)
No red flag signs or symptoms. Will place referral to surgery.

## 2016-12-26 ENCOUNTER — Telehealth: Payer: Self-pay | Admitting: Family Medicine

## 2016-12-26 LAB — CBC
HEMOGLOBIN: 13.5 g/dL (ref 11.1–15.9)
Hematocrit: 41.7 % (ref 34.0–46.6)
MCH: 30.3 pg (ref 26.6–33.0)
MCHC: 32.4 g/dL (ref 31.5–35.7)
MCV: 94 fL (ref 79–97)
Platelets: 220 10*3/uL (ref 150–379)
RBC: 4.46 x10E6/uL (ref 3.77–5.28)
RDW: 14.3 % (ref 12.3–15.4)
WBC: 5.9 10*3/uL (ref 3.4–10.8)

## 2016-12-26 LAB — CMP14+EGFR
ALBUMIN: 4.4 g/dL (ref 3.5–5.5)
ALK PHOS: 86 IU/L (ref 39–117)
ALT: 18 IU/L (ref 0–32)
AST: 21 IU/L (ref 0–40)
Albumin/Globulin Ratio: 1.6 (ref 1.2–2.2)
BUN / CREAT RATIO: 13 (ref 9–23)
BUN: 11 mg/dL (ref 6–24)
Bilirubin Total: 0.4 mg/dL (ref 0.0–1.2)
CALCIUM: 9.1 mg/dL (ref 8.7–10.2)
CO2: 21 mmol/L (ref 18–29)
CREATININE: 0.83 mg/dL (ref 0.57–1.00)
Chloride: 102 mmol/L (ref 96–106)
GFR calc Af Amer: 90 mL/min/{1.73_m2} (ref >59)
GFR, EST NON AFRICAN AMERICAN: 78 mL/min/{1.73_m2} (ref >59)
GLUCOSE: 87 mg/dL (ref 65–99)
Globulin, Total: 2.7 g/dL (ref 1.5–4.5)
Potassium: 4.5 mmol/L (ref 3.5–5.2)
Sodium: 140 mmol/L (ref 134–144)
Total Protein: 7.1 g/dL (ref 6.0–8.5)

## 2016-12-26 LAB — LIPID PANEL
Chol/HDL Ratio: 5.5 ratio — ABNORMAL HIGH (ref 0.0–4.4)
Cholesterol, Total: 254 mg/dL — ABNORMAL HIGH (ref 100–199)
HDL: 46 mg/dL (ref >39)
LDL CALC: 178 mg/dL — AB (ref 0–99)
TRIGLYCERIDES: 148 mg/dL (ref 0–149)
VLDL CHOLESTEROL CAL: 30 mg/dL (ref 5–40)

## 2016-12-26 NOTE — Telephone Encounter (Signed)
Pt is returning Dr. Lavone NeriParker's call, please call back regarding lab results. ep

## 2016-12-27 MED ORDER — ATORVASTATIN CALCIUM 40 MG PO TABS: 40.0000 mg | ORAL_TABLET | Freq: Every day | ORAL | 5 refills | Status: DC

## 2016-12-27 MED FILL — ATORVASTATIN 40 MG TABLET: 40 | 30 days supply | Qty: 30 | Fill #0

## 2016-12-27 NOTE — Telephone Encounter (Signed)
Called patient to discuss lab results. Notable for hypercholesterolemia. Will start atrovastatin. Discussed side effects with patient. She will also be following up with the nutritionist.  Katina Degreealeb M. Jimmey RalphParker, MD [facility] Resident PGY-3 12/27/2016 11:00 AM

## 2016-12-27 NOTE — Telephone Encounter (Signed)
2nd request. ep °

## 2017-01-10 ENCOUNTER — Other Ambulatory Visit: Payer: Self-pay | Admitting: Family Medicine

## 2017-01-10 NOTE — Telephone Encounter (Signed)
Pt calling to request refill of:  Name of Medication(s): diflucan  Last date of OV:  12-25-16 [facility]   Will route refill request to Clinic RN.  Discussed with patient policy to call pharmacy for future refills.  Also, discussed refills may take up to 48 hours to approve or deny.  Markus JarvisEmily C Pittman

## 2017-01-16 ENCOUNTER — Other Ambulatory Visit: Payer: Self-pay | Admitting: Family Medicine

## 2017-01-16 NOTE — Telephone Encounter (Signed)
Patient needs to schedule an office appointment.   Katina Degreealeb M. Jimmey RalphParker, MD [facility] Resident PGY-3 01/16/2017 4:55 PM

## 2017-01-16 NOTE — Telephone Encounter (Signed)
Pt states she needs a z-pack desperately and that Dr. Jimmey RalphParker gives her one when she has her yearly issues with her face and her neck eyes. Pt uses [facility]. Pt states to call her with any questions. ep

## 2017-01-16 NOTE — Telephone Encounter (Signed)
Pt meant to ask for prednisone instead of z pack.

## 2017-01-17 ENCOUNTER — Ambulatory Visit (INDEPENDENT_AMBULATORY_CARE_PROVIDER_SITE_OTHER): Payer: Managed Care, Other (non HMO) | Admitting: Student

## 2017-01-17 ENCOUNTER — Encounter: Payer: Self-pay | Admitting: Student

## 2017-01-17 DIAGNOSIS — J302 Other seasonal allergic rhinitis: Secondary | ICD-10-CM | POA: Diagnosis not present

## 2017-01-17 MED ORDER — CETIRIZINE HCL 10 MG PO TABS: 10.0000 mg | ORAL_TABLET | Freq: Every day | ORAL | 11 refills | Status: DC

## 2017-01-17 MED ORDER — PREDNISONE 10 MG (21) PO TBPK: ORAL_TABLET | ORAL | 0 refills | Status: DC

## 2017-01-17 MED ORDER — OLOPATADINE HCL 0.2 % OP SOLN: 1.0000 [drp] | Freq: Every day | OPHTHALMIC | 5 refills | Status: DC

## 2017-01-17 MED FILL — predniSONE 10 MG TABS: 10 | 6 days supply | Qty: 21 | Fill #0

## 2017-01-17 NOTE — Telephone Encounter (Signed)
Patient has an appointment today. Coline Calkin,CMA  

## 2017-01-17 NOTE — Progress Notes (Signed)
   Subjective:    [name], female    DOB: 1961-08-10, 56 y.o.   MRN: [id number]   CC: allergies  HPI: 56 y/o F presents for allerhy concerns   Allergies - she reports that she has had diffuse skin itching and watery eyes for the last several days - she states that she gets significant allergy sympomts svery year around this tims - she has tried benadryl with little relief, she only takes flonase regularly for allergies - she reports taking a steroid taper in past which helped - she had two prednisone pills left over and took them yesterday which she feels helped her eye swelling but she still has significant symptoms  Smoking status reviewed  Review of Systems  Per HPI, else denies recent illness, fever, chest pain, shortness of breath,    Objective:  BP (!) 148/82   Pulse 75   Temp 98.1 F (36.7 C) (Oral)   Wt 287 lb (130.2 kg)   SpO2 97%   BMI 44.95 kg/m  Vitals and nursing note reviewed  General: NAD HEENT: mildy erythema of bilateral conjunctiva, else EOMI Cardiac: RRR, Respiratory: CTAB, normal effort Extremities: no edema or cyanosis. WWP. Skin: diffuse dry skin with excoriation, no rashes noted,  Neuro: alert and oriented, no focal deficits   Assessment & Plan:    Seasonal allergies Seasonal allergies now with significant symptoms - will start steroid taper for symptoms - then continue zyrtex - start olopatadibne eye drops - follow with PCP    Modesta Sammons A. Kennon RoundsHaney MD, MS Family Medicine Resident PGY-3 Pager [phone]

## 2017-01-17 NOTE — Patient Instructions (Signed)
Follow up with PCP Take steroids as prescribed Call the office with questions or concerns

## 2017-01-17 NOTE — Assessment & Plan Note (Signed)
Seasonal allergies now with significant symptoms - will start steroid taper for symptoms - then continue zyrtex - start olopatadibne eye drops - follow with PCP

## 2017-02-05 ENCOUNTER — Telehealth: Payer: Self-pay | Admitting: Internal Medicine

## 2017-02-05 NOTE — Telephone Encounter (Signed)
After Hours Emergency Line Call:   Received call from Ms. [name] about concern that there was an enlargement on one side of her neck compared to the other. Reported that it was located above the right clavicle and that it had been noticed by her daughter this evening while they were in the ED with patient's father. Patient reports chronic sore throat and congestion but otherwise has not noted new pain. She has been afebrile and is not having any shortness of breath. No overlying skin color changes or increased warmth. Patient sounds very stable and not warranting emergent evaluation. However, history concerning for possible supraclavicular lymph node enlargement. Have scheduled for appointment with Dr. Caroleen Hammanumley tomorrow, 6/20, at 10:45. Patient reports that she will be able to attend this appointment.   Marcy Sirenatherine Ai Sonnenfeld, D.O. 02/05/2017, 8:37 PM PGY-2, [facility]

## 2017-02-06 ENCOUNTER — Ambulatory Visit (INDEPENDENT_AMBULATORY_CARE_PROVIDER_SITE_OTHER): Payer: Managed Care, Other (non HMO) | Admitting: Family Medicine

## 2017-02-06 ENCOUNTER — Encounter: Payer: Self-pay | Admitting: Family Medicine

## 2017-02-06 VITALS — BP 140/80 | HR 74 | Temp 98.0°F | Ht 67.0 in | Wt 287.4 lb

## 2017-02-06 DIAGNOSIS — R042 Hemoptysis: Secondary | ICD-10-CM | POA: Diagnosis not present

## 2017-02-06 NOTE — Patient Instructions (Signed)
Given your long history of coughing up blood, night sweats, and hoarseness, we will obtain a CT of the chest. We will also check some blood work here in the office. Please return following your CT or sooner if needed.  Dr. Caroleen Hammanumley

## 2017-02-07 LAB — BASIC METABOLIC PANEL
BUN / CREAT RATIO: 13 (ref 9–23)
BUN: 12 mg/dL (ref 6–24)
CO2: 23 mmol/L (ref 20–29)
CREATININE: 0.94 mg/dL (ref 0.57–1.00)
Calcium: 9.2 mg/dL (ref 8.7–10.2)
Chloride: 104 mmol/L (ref 96–106)
GFR calc non Af Amer: 67 mL/min/{1.73_m2} (ref >59)
GFR, EST AFRICAN AMERICAN: 77 mL/min/{1.73_m2} (ref >59)
GLUCOSE: 99 mg/dL (ref 65–99)
Potassium: 4.4 mmol/L (ref 3.5–5.2)
Sodium: 143 mmol/L (ref 134–144)

## 2017-02-11 NOTE — Progress Notes (Addendum)
Subjective:     [name], female   DOB: 1960/11/25, 56 y.o.   MRN: [id number]  HPI [name] is a 56yo female presenting today for possible supraclavicular lymph node. Reports her daughter noted asymmetry of her clavicles yesterday and she felt a lymph node above her right clavicle. Called the after hours line and they recommended scheduling an appointment today in clinic. Reports episodes of both the flu and pneumonia in the last several months. Reports since December, she has been coughing up blood sputum every morning. Has productive cough throughout the day, but does not note any blood in it. Had a CXR in 11/2016, which was normal. Denies weight loss. Notes night sweats. Notes hoarseness. Feels a catching in her throat, but denies difficulty swallowing solids or liquids. No known exposure to TB. No known tick exposures. Denies recent travel. Denies rash. Denies IV drug use. Nonsmoker.  Review of Systems Per HPI    Objective:   Physical Exam  Constitutional: She appears well-developed and well-nourished. No distress.  HENT:  Head: Normocephalic and atraumatic.  Mouth/Throat: Oropharynx is clear and moist.  Eyes: Pupils are equal, round, and reactive to light.  Neck:  Right clavicle more prominent than left, questionable supraclavicular lymphadenopathy on right  Cardiovascular: Normal rate and regular rhythm.   No murmur heard. Pulmonary/Chest: Effort normal. No respiratory distress. She has no wheezes.  Abdominal: Soft. Bowel sounds are normal. She exhibits no distension. There is no tenderness.  Musculoskeletal: She exhibits no edema.  Skin: No rash noted.  Psychiatric: She has a normal mood and affect. Her behavior is normal.      Assessment and Plan:     1. Hemoptysis With night sweats, hoarseness, and catching in throat. Weight stable. Xray normal. Will obtain CT chest to evaluate red flag symptoms and possible supraclavicular lymphadenopathy; will also  determine if biopsy needed. Quant gold obtained. Will also check BMP. CBC checked recently and normal. Follow up pending CT scan.

## 2017-02-14 ENCOUNTER — Telehealth: Payer: Self-pay | Admitting: Family Medicine

## 2017-02-14 LAB — QUANTIFERON IN TUBE
QUANTIFERON MITOGEN VALUE: 7.15 IU/mL
QUANTIFERON TB AG VALUE: 0.04 [IU]/mL
QUANTIFERON TB GOLD: NEGATIVE
Quantiferon Nil Value: 0.05 IU/mL

## 2017-02-14 LAB — QUANTIFERON TB GOLD ASSAY (BLOOD)

## 2017-02-14 NOTE — Telephone Encounter (Signed)
Attempted to contact to let her know Angela BurkeQuant Gold was negative without response. Please continue to attempt contact to let her know.

## 2017-02-15 NOTE — Telephone Encounter (Signed)
Pt informed. Zimmerman Rumple, April D, CMA  

## 2017-02-19 ENCOUNTER — Ambulatory Visit (HOSPITAL_COMMUNITY): Payer: Managed Care, Other (non HMO)

## 2017-02-22 ENCOUNTER — Other Ambulatory Visit: Payer: Self-pay | Admitting: Family Medicine

## 2017-05-07 MED FILL — GABAPENTIN 300 MG CAPSULE: 300 | 30 days supply | Qty: 90 | Fill #0

## 2017-06-04 ENCOUNTER — Encounter: Payer: Self-pay | Admitting: Family Medicine

## 2017-06-04 ENCOUNTER — Ambulatory Visit (INDEPENDENT_AMBULATORY_CARE_PROVIDER_SITE_OTHER): Payer: 59 | Admitting: Family Medicine

## 2017-06-04 VITALS — BP 152/80 | HR 75 | Temp 98.5°F | Wt 285.0 lb

## 2017-06-04 DIAGNOSIS — H60399 Other infective otitis externa, unspecified ear: Secondary | ICD-10-CM | POA: Insufficient documentation

## 2017-06-04 DIAGNOSIS — H60392 Other infective otitis externa, left ear: Secondary | ICD-10-CM

## 2017-06-04 DIAGNOSIS — R0981 Nasal congestion: Secondary | ICD-10-CM | POA: Diagnosis not present

## 2017-06-04 HISTORY — DX: Other infective otitis externa, unspecified ear: H60.399

## 2017-06-04 MED ORDER — CIPROFLOXACIN-DEXAMETHASONE 0.3-0.1 % OT SUSP: 4.0000 [drp] | Freq: Two times a day (BID) | OTIC | 0 refills | Status: AC

## 2017-06-04 NOTE — Progress Notes (Signed)
Subjective:    [name], female    DOB: Feb 11, 1961, 56 y.o.   MRN: [id number]   CC: left ear clicking and sore throat .  HPI: Left ear pain Patient reports ear pain x 3 weeks duration. States that pain has improved slightly but is still painful. Pain is intermittent but patient states her whole left side hurts at night when she lays on that side. Pain increases when air gets in her ear. Nothing is noted to improve pain. Patient has history of tinnitus in that ear, but notices that it is slightly worse now. Patient denies dizziness or lightheadedness. Denies hearing loss. Patient notes pain is made worse when she moves her head or tugs at her ear. Patient has been using warm water and peroxide in her ear and notes that it stings when she uses this.   Congestion Patient states this is a chronic condition but has been worse x 2 weeks. Patient reports hard phlegm production that is dark red in color, but this is a chronic problem that has been worked up in the [facility] before. Patient denies sinus pressure. Patient reports that she has a dry metallic taste in her mouth. She also notes that her voice is hoarse. Patient states she uses Claritin daily in the morning and uses warm and cold compresses for relief. Patient also uses saline nasal spray every other day and has some relief of symptoms with this. Patient denies fever, chills, sore throat, or eye drainage. Patient notes increased nasal drainage and states she blows her nose frequently in the morning. Patient also reports coughing when she talks or sings a lot x 2 months in duration. Patient notes no sick contacts, but has a 26 y.o. Granddaughter. Patient uses a wood stove for heat.   Smoking status reviewed. None. Never a smoker.   Review of Systems All negative other than noted in HPI  Objective:  BP (!) 152/80   Pulse 75   Temp 98.5 F (36.9 C) (Oral)   Wt 285 lb (129.3 kg)   SpO2 99%   BMI 44.64 kg/m  Vitals and  nursing note reviewed  General: well nourished, in no acute distress HEENT: normocephalic, TM's visualized bilaterally, edema of left auditory canal with tenderness during exam, tenderness when touching auricle, normal right auditory canal and tympanic membrane with increases cerumen production, no scleral icterus or conjunctival pallor, PERRL, EOMI, no nasal discharge, moist mucous membranes, good dentition without erythema or discharge noted in posterior oropharynx, tenderness to palpation of maxillary sinus  Neck: supple, non-tender, without lymphadenopathy Cardiac: RRR, clear S1 and S2, no murmurs, rubs, or gallops Respiratory: clear to auscultation bilaterally, no increased work of breathing Abdomen: soft, nontender, nondistended, no masses or organomegaly. Bowel sounds present Extremities: no edema or cyanosis.  Skin: warm and dry, no rashes noted Neuro: alert and oriented, no focal deficits  Assessment & Plan:    Otitis, externa, infective Patient complains of left ear pain x 3 weeks. Notes pain is worse when pulling at auricle or when air gets into ear. Patient has been using warm water and peroxide in ear. Patient states she does clean out hear ears frequently as well. Physical exam reveals edema and tenderness of left auditory canal. Patient reports pain when touching auricle of ear. Given recent history of water exposure to ear and edema on exam likely otitis externa -prescribed ciprodex 4 drops bid x 7 days -advised to return if symptoms fail to return with treatment  -  advised to not use water or peroxide in ear any longer -advised patient to not use foreign objects in ear as this may cause irritation to area  -follow up as needed or if symptoms return or worsen   Nasal congestion Likely 2/2 seasonal allergies given her history of allergies and improvement with saline nasal spray. Patient reports blowing her nose in the mornings frequently and this may exacerbate  condition -advised to use flonase nasal spray daily. Instructed patient how to properly use nasal spray and that this may not take effect for 3-4 days and patient demonstrated nasal spray technique.  -continue to use claritin as needed  -return as needed or if symptoms worsen or fail to improve   Return if symptoms worsen or fail to improve.  [name], DO, PGY-1

## 2017-06-04 NOTE — Patient Instructions (Signed)
Otitis Externa Otitis externa is an infection of the outer ear canal. The outer ear canal is the area between the outside of the ear and the eardrum. Otitis externa is sometimes called "swimmer's ear." Follow these instructions at home:  If you were given antibiotic ear drops, use them as told by your doctor. Do not stop using them even if your condition gets better.  Take over-the-counter and prescription medicines only as told by your doctor.  Keep all follow-up visits as told by your doctor. This is important. How is this prevented?  Keep your ear dry. Use the corner of a towel to dry your ear after you swim or bathe.  Try not to scratch or put things in your ear. Doing these things makes it easier for germs to grow in your ear.  Avoid swimming in lakes, dirty water, or pools that may not have the right amount of a chemical called chlorine.  Consider making ear drops and putting 3 or 4 drops in each ear after you swim. Ask your doctor about how you can make ear drops. Contact a doctor if:  You have a fever.  After 3 days your ear is still red, swollen, or painful.  After 3 days you still have pus coming from your ear.  Your redness, swelling, or pain gets worse.  You have a really bad headache.  You have redness, swelling, pain, or tenderness behind your ear. This information is not intended to replace advice given to you by your health care provider. Make sure you discuss any questions you have with your health care provider. Document Released: 01/23/2008 Document Revised: 09/01/2015 Document Reviewed: 05/16/2015 Elsevier Interactive Patient Education  [employer].   It was a pleasure meeting you today.   Today we discussed your left ear pain and congestion.  For the ear pain I think you have an infection commonly known as swimmer's ear. Please STOP using the warm water and peroxide as this can make the pain worse. Please use the ciprodex ear drops (4 drops in your  left ear twice a day for 7 days). Please follow up if your symptoms do not improve.   For your congestion I would recommend you use flonase nasal spray and administer as I showed you in clinic. This may take 3-4 days to take effect.   Please follow up as needed or sooner if symptoms persist or worsen. Please call the clinic immediately if your symptoms worsen, you develop a fever, or begin to have hearing loss or dizziness.   Our clinic's number is [phone]. Please call with questions or concerns.   Thank you,  [name], DO

## 2017-06-04 NOTE — Assessment & Plan Note (Signed)
Likely 2/2 seasonal allergies given her history of allergies and improvement with saline nasal spray. Patient reports blowing her nose in the mornings frequently and this may exacerbate condition -advised to use flonase nasal spray daily. Instructed patient how to properly use nasal spray and that this may not take effect for 3-4 days and patient demonstrated nasal spray technique.  -continue to use claritin as needed  -return as needed or if symptoms worsen or fail to improve

## 2017-06-04 NOTE — Assessment & Plan Note (Addendum)
Patient complains of left ear pain x 3 weeks. Notes pain is worse when pulling at auricle or when air gets into ear. Patient has been using warm water and peroxide in ear. Patient states she does clean out hear ears frequently as well. Physical exam reveals edema and tenderness of left auditory canal. Patient reports pain when touching auricle of ear. Given recent history of water exposure to ear and edema on exam likely otitis externa -prescribed ciprodex 4 drops bid x 7 days -advised to return if symptoms fail to return with treatment  -advised to not use water or peroxide in ear any longer -advised patient to not use foreign objects in ear as this may cause irritation to area  -follow up as needed or if symptoms return or worsen

## 2017-08-16 ENCOUNTER — Ambulatory Visit (INDEPENDENT_AMBULATORY_CARE_PROVIDER_SITE_OTHER): Payer: 59 | Admitting: Family Medicine

## 2017-08-16 ENCOUNTER — Encounter: Payer: Self-pay | Admitting: Family Medicine

## 2017-08-16 ENCOUNTER — Other Ambulatory Visit: Payer: Self-pay

## 2017-08-16 VITALS — BP 142/70 | HR 88 | Temp 98.4°F | Ht 67.0 in | Wt 284.0 lb

## 2017-08-16 DIAGNOSIS — B9789 Other viral agents as the cause of diseases classified elsewhere: Secondary | ICD-10-CM

## 2017-08-16 DIAGNOSIS — L219 Seborrheic dermatitis, unspecified: Secondary | ICD-10-CM | POA: Diagnosis not present

## 2017-08-16 DIAGNOSIS — J069 Acute upper respiratory infection, unspecified: Secondary | ICD-10-CM

## 2017-08-16 MED ORDER — AZITHROMYCIN 250 MG PO TABS: ORAL_TABLET | ORAL | 0 refills | Status: AC

## 2017-08-16 MED ORDER — KETOCONAZOLE 1 % EX SHAM: 5.0000 mL | MEDICATED_SHAMPOO | CUTANEOUS | 0 refills | Status: AC

## 2017-08-16 MED FILL — AZITHROMYCIN 250 MG TABLET: 250 | 5 days supply | Qty: 6 | Fill #0

## 2017-08-16 NOTE — Patient Instructions (Signed)
Upper Respiratory Infection, Adult Most upper respiratory infections (URIs) are caused by a virus. A URI affects the nose, throat, and upper air passages. The most common type of URI is often called "the common cold." Follow these instructions at home:  Take medicines only as told by your doctor.  Gargle warm saltwater or take cough drops to comfort your throat as told by your doctor.  Use a warm mist humidifier or inhale steam from a shower to increase air moisture. This may make it easier to breathe.  Drink enough fluid to keep your pee (urine) clear or pale yellow.  Eat soups and other clear broths.  Have a healthy diet.  Rest as needed.  Go back to work when your fever is gone or your doctor says it is okay. ? You may need to stay home longer to avoid giving your URI to others. ? You can also wear a face mask and wash your hands often to prevent spread of the virus.  Use your inhaler more if you have asthma.  Do not use any tobacco products, including cigarettes, chewing tobacco, or electronic cigarettes. If you need help quitting, ask your doctor. Contact a doctor if:  You are getting worse, not better.  Your symptoms are not helped by medicine.  You have chills.  You are getting more short of breath.  You have brown or red mucus.  You have yellow or brown discharge from your nose.  You have pain in your face, especially when you bend forward.  You have a fever.  You have puffy (swollen) neck glands.  You have pain while swallowing.  You have white areas in the back of your throat. Get help right away if:  You have very bad or constant: ? Headache. ? Ear pain. ? Pain in your forehead, behind your eyes, and over your cheekbones (sinus pain). ? Chest pain.  You have long-lasting (chronic) lung disease and any of the following: ? Wheezing. ? Long-lasting cough. ? Coughing up blood. ? A change in your usual mucus.  You have a stiff neck.  You have  changes in your: ? Vision. ? Hearing. ? Thinking. ? Mood. This information is not intended to replace advice given to you by your health care provider. Make sure you discuss any questions you have with your health care provider. Document Released: 01/23/2008 Document Revised: 04/08/2016 Document Reviewed: 11/11/2013 Elsevier Interactive Patient Education  2018 Elsevier Inc.  

## 2017-08-16 NOTE — Progress Notes (Signed)
Subjective:    [name], female    DOB: December 11, 1960, 56 y.o.   MRN: [id number]   CC: Cough, congestion, fever  HPI: Patient is a 56 year old female presents today complaining of severe productive cough, congestion, rhinorrhea and fever.  Patient reports that she has been having symptoms for the past few days.  Patient reports sick contacts (daughter and granddaughter).  Patient has not tried any medication except for Tylenol.  Patient reports similar symptoms last year where lead to pneumonia.  Patient reports being miserable with multiple episodes of emesis due to cough.  Patient also reports shortness of breath.  Patient denies any chest pain, abdominal pain, dizziness, headaches.  Patient has not had a flu shot this year.  Smoking status reviewed   ROS: all other systems were reviewed and are negative other than in the HPI   Past Medical History:  Diagnosis Date  . Arthritis   . Fatty liver   . GERD (gastroesophageal reflux disease)   . Hypertension   . PONV (postoperative nausea and vomiting)    with pain med  . Sleep apnea    previously no longer neg sleep study  . Thoracic nerve root impingement 08-05/2010   Severe right flank pain. Resolved with thoracic epidural steroid injection.   Marland Kitchen. Umbilical hernia   . UTI (lower urinary tract infection)    recent- unable to finish med only took 2 days    Past Surgical History:  Procedure Laterality Date  . ABDOMINAL HYSTERECTOMY  2004   "partial"  . BACK SURGERY  04,06   x2  . BILATERAL OOPHORECTOMY  2010   2/2 malignancy  . CARPAL TUNNEL RELEASE Bilateral ?1999  . CESAREAN SECTION  1989  . LAMINECTOMY AND MICRODISCECTOMY LUMBAR SPINE  2004; 2006  . TOTAL KNEE ARTHROPLASTY Left 10/06/2013   Procedure: TOTAL KNEE ARTHROPLASTY;  Surgeon: Sheral Apleyimothy D Murphy, MD;  Location: [facility] OR;  Service: Orthopedics;  Laterality: Left;  . TOTAL KNEE ARTHROPLASTY Right 11/09/2013   Procedure: RIGHT TOTAL KNEE ARTHROPLASTY;   Surgeon: Sheral Apleyimothy D Murphy, MD;  Location: [facility] OR;  Service: Orthopedics;  Laterality: Right;    Past medical history, surgical, family, and social history reviewed and updated in the EMR as appropriate.  Objective:  BP (!) 142/70   Pulse 88   Temp 98.4 F (36.9 C) (Oral)   Ht 5\' 7"  (1.702 m)   Wt 284 lb (128.8 kg)   SpO2 95%   BMI 44.48 kg/m   Vitals and nursing note reviewed  General: NAD, pleasant, able to participate in exam Cardiac: RRR, normal heart sounds, no murmurs. 2+ radial and PT pulses bilaterally Respiratory: CTAB, normal effort, No wheezes, rales or rhonchi Abdomen: soft, nontender, nondistended, no hepatic or splenomegaly, +BS Extremities: no edema or cyanosis. WWP. Skin: warm and dry, no rashes noted Neuro: alert and oriented x4, no focal deficits Psych: Normal affect and mood   Assessment & Plan:   #Cough, congestion, fever, shortness of breath Patient presented if symptoms consistent with viral URI.  Patient endorses some fevers and chills at home as well as shortness of breath.  Patient has a history of pneumonia secondary to viral URI.  Given multiple comorbid will likely prescribe short course of antibiotics.  Recommended increase hydration, Mucinex for congestion, cough syrup as needed. --Prescribed azithromycin pack take for 5 days --Follow-up in clinic as needed if symptoms worsens or do not improve    Anne NeighboursAbdoulaye Imogean Ciampa, MD [facility] PGY-2

## 2017-08-22 ENCOUNTER — Other Ambulatory Visit: Payer: Self-pay | Admitting: Family Medicine

## 2017-08-23 MED FILL — GABAPENTIN 300 MG CAPSULE: 300 | 30 days supply | Qty: 90 | Fill #0

## 2017-10-13 ENCOUNTER — Telehealth: Payer: Self-pay | Admitting: Student in an Organized Health Care Education/Training Program

## 2017-10-13 NOTE — Telephone Encounter (Signed)
**  After Hours/ Emergency Line Call*  Received a call to report that [name] has had unilateral eye redness for 2 weeks with a stye under the eylid.  Endorsing conjunctival redness.  Denying sick contacts at home. No eye pain with extraocular movements. Some AM drainage but no eye drainage throughout the day. No blurred vision.  Recommended that she use warm compresses on the stye, recommend frequent hand washing incase it is a viral conjunctivitis. Asked her to call through front office in AM since we it is impossible to diagnose over the phone.  Red flags discussed. Come to ED overnight if develops eye pain or changes in vision. Will forward to PCP.  Howard PouchLauren Nancylee Gaines, MD PGY-2, [facility]

## 2017-10-14 ENCOUNTER — Encounter: Payer: Self-pay | Admitting: Internal Medicine

## 2017-10-14 ENCOUNTER — Other Ambulatory Visit: Payer: Self-pay

## 2017-10-14 ENCOUNTER — Ambulatory Visit: Payer: 59 | Admitting: Internal Medicine

## 2017-10-14 VITALS — BP 152/80 | HR 81 | Temp 97.9°F | Wt 286.0 lb

## 2017-10-14 DIAGNOSIS — H00025 Hordeolum internum left lower eyelid: Secondary | ICD-10-CM | POA: Diagnosis not present

## 2017-10-14 DIAGNOSIS — I10 Essential (primary) hypertension: Secondary | ICD-10-CM | POA: Diagnosis not present

## 2017-10-14 DIAGNOSIS — H1032 Unspecified acute conjunctivitis, left eye: Secondary | ICD-10-CM

## 2017-10-14 DIAGNOSIS — H01005 Unspecified blepharitis left lower eyelid: Secondary | ICD-10-CM

## 2017-10-14 MED ORDER — ERYTHROMYCIN 5 MG/GM OP OINT: 1.0000 "application " | TOPICAL_OINTMENT | Freq: Two times a day (BID) | OPHTHALMIC | 0 refills | Status: DC

## 2017-10-14 MED ORDER — ERYTHROMYCIN 5 MG/GM OP OINT: 1.0000 "application " | TOPICAL_OINTMENT | Freq: Two times a day (BID) | OPHTHALMIC | 0 refills | Status: AC

## 2017-10-14 MED ORDER — LISINOPRIL 10 MG PO TABS: 10.0000 mg | ORAL_TABLET | Freq: Every day | ORAL | 5 refills | Status: DC

## 2017-10-14 MED FILL — ERYTHROMYCIN 0.5% EYE OINT: 5 | 5 days supply | Qty: 4 | Fill #0

## 2017-10-14 MED FILL — LISINOPRIL 10 MG TABS: 10 | 30 days supply | Qty: 30 | Fill #0

## 2017-10-14 NOTE — Progress Notes (Signed)
   [facility] Phone: [phone]   Date of Visit: 10/14/2017   HPI:  Stye on the Left Eye:  - reports of stye on the bottom lid of the left eye for 2.5-3 weeks - she has tried warm compresses, baby soap, tea bags without much relief.  - reports the area is painful - no blurred vision or pain with movement of the eye. No foreign body sensation  - no fevers or chills  HTN: - reports that she is not sure what medication she should be on, Lisinopril or metoprolol - she has not been seen in clinic in a while for HTN.    ROS: See HPI.  PMFSH:  PMH: HTN OSA GERD OA   PHYSICAL EXAM: BP (!) 158/80   Pulse 81   Temp 97.9 F (36.6 C) (Oral)   Wt 286 lb (129.7 kg)   SpO2 97%   BMI 44.79 kg/m  GEN: NAD HEENT: Atraumatic, normocephalic, neck supple, right eye normal in appearance, left eye- with mild conjunctivitis, hordeolum on the bottom lid that is internal with some discharge around the region that likely came from the hordeolum. There is some mild blepharitis of the lower lid of the left eye. EOMI intact and without pain.  CV: RRR, no murmurs, rubs, or gallops PULM: CTAB, normal effort NEURO: Awake, alert, no focal deficits grossly, normal speech   ASSESSMENT/PLAN:  Blepharitis of left lower eyelid, unspecified type Hordeolum internum of left lower eyelid Acute bacterial conjunctivitis of left eye No signs of orbital or periorbital cellulitis. Will prescribe Erythromycin BID x 5 days. Follow up Thursday or Friday if no improvement. If no improvement, would consider oral antibiotic.   Essential hypertension Elevated on repeat check.  It looks like she is supposed to be on lisinopril 10 mg daily.  Will restart lisinopril 10 mg daily.  Check BMP today and in 1 week. - Basic metabolic panel - lisinopril (PRINIVIL,ZESTRIL) 10 MG tablet; Take 1 tablet (10 mg total) by mouth daily.  Dispense: 30 tablet; Refill: 5  No problem-specific Assessment & Plan  notes found for this encounter.   Palma HolterKanishka G Gunadasa, MD PGY 3  Family Medicine

## 2017-10-14 NOTE — Patient Instructions (Signed)
We have prescribed Erythromycin eye ointment for your left eye. Please use twice a day for 5 days. I would follow up on THursday or Friday if your symptoms do not improve with the ointment.   Please start taking Lisinopril

## 2017-10-15 ENCOUNTER — Encounter: Payer: Self-pay | Admitting: Internal Medicine

## 2017-10-15 LAB — BASIC METABOLIC PANEL
BUN/Creatinine Ratio: 14 (ref 9–23)
BUN: 13 mg/dL (ref 6–24)
CALCIUM: 9.1 mg/dL (ref 8.7–10.2)
CHLORIDE: 105 mmol/L (ref 96–106)
CO2: 24 mmol/L (ref 20–29)
Creatinine, Ser: 0.92 mg/dL (ref 0.57–1.00)
GFR calc Af Amer: 79 mL/min/{1.73_m2} (ref >59)
GFR calc non Af Amer: 69 mL/min/{1.73_m2} (ref >59)
Glucose: 109 mg/dL — ABNORMAL HIGH (ref 65–99)
POTASSIUM: 4 mmol/L (ref 3.5–5.2)
SODIUM: 143 mmol/L (ref 134–144)

## 2017-10-15 NOTE — Progress Notes (Signed)
Sent letter with unremarkable BMP 

## 2017-11-07 NOTE — Telephone Encounter (Signed)
Opened in error. Deseree Blount, CMA 

## 2017-12-10 ENCOUNTER — Other Ambulatory Visit: Payer: Self-pay | Admitting: Family Medicine

## 2017-12-11 MED FILL — GABAPENTIN 300 MG CAPSULE: 300 | 30 days supply | Qty: 90 | Fill #0

## 2017-12-11 NOTE — Telephone Encounter (Signed)
Pt calling again regarding her refill. She is upset because she has been out of meds since Monday and is still waiting on her refill. Call back [phone] Shawna OrleansMeredith B Suleika Donavan, RN

## 2018-02-04 ENCOUNTER — Other Ambulatory Visit: Payer: Self-pay | Admitting: Family Medicine

## 2018-02-04 MED FILL — GABAPENTIN 300 MG CAPSULE: 300 | 30 days supply | Qty: 90 | Fill #0

## 2018-02-04 MED FILL — LISINOPRIL 10 MG TABLET: 10 | 30 days supply | Qty: 30 | Fill #1

## 2018-04-07 DIAGNOSIS — M19041 Primary osteoarthritis, right hand: Secondary | ICD-10-CM | POA: Diagnosis not present

## 2018-04-09 ENCOUNTER — Other Ambulatory Visit: Payer: Self-pay | Admitting: Family Medicine

## 2018-05-08 MED FILL — LISINOPRIL 10 MG TABLET: 10 | 30 days supply | Qty: 30 | Fill #2

## 2018-05-08 MED FILL — GABAPENTIN 300 MG CAPSULE: 300 | 30 days supply | Qty: 90 | Fill #0

## 2018-05-08 MED FILL — predniSONE 10 MG TABS: 10 | 6 days supply | Qty: 21 | Fill #0

## 2018-06-20 ENCOUNTER — Other Ambulatory Visit: Payer: Self-pay | Admitting: Family Medicine

## 2018-06-24 ENCOUNTER — Other Ambulatory Visit: Payer: Self-pay | Admitting: Family Medicine

## 2018-06-24 ENCOUNTER — Other Ambulatory Visit: Payer: Self-pay | Admitting: *Deleted

## 2018-06-25 MED ORDER — ATORVASTATIN CALCIUM 40 MG PO TABS: 40.0000 mg | ORAL_TABLET | Freq: Every day | ORAL | 3 refills | Status: DC

## 2018-09-16 DIAGNOSIS — H2513 Age-related nuclear cataract, bilateral: Secondary | ICD-10-CM | POA: Diagnosis not present

## 2018-09-16 DIAGNOSIS — H5203 Hypermetropia, bilateral: Secondary | ICD-10-CM | POA: Diagnosis not present

## 2018-09-16 DIAGNOSIS — H52203 Unspecified astigmatism, bilateral: Secondary | ICD-10-CM | POA: Diagnosis not present

## 2018-09-16 DIAGNOSIS — H524 Presbyopia: Secondary | ICD-10-CM | POA: Diagnosis not present

## 2018-09-25 ENCOUNTER — Other Ambulatory Visit: Payer: Self-pay | Admitting: Family Medicine

## 2018-09-25 DIAGNOSIS — Z1231 Encounter for screening mammogram for malignant neoplasm of breast: Secondary | ICD-10-CM

## 2018-09-26 ENCOUNTER — Ambulatory Visit: Payer: 59

## 2018-10-21 ENCOUNTER — Ambulatory Visit (INDEPENDENT_AMBULATORY_CARE_PROVIDER_SITE_OTHER): Payer: 59 | Admitting: Family Medicine

## 2018-10-21 ENCOUNTER — Encounter: Payer: Self-pay | Admitting: Family Medicine

## 2018-10-21 ENCOUNTER — Other Ambulatory Visit (HOSPITAL_COMMUNITY)
Admission: RE | Admit: 2018-10-21 | Discharge: 2018-10-21 | Disposition: A | Discharge status: Home / Self Care | Payer: 59 | Source: Ambulatory Visit | Attending: Family Medicine | Admitting: Family Medicine

## 2018-10-21 ENCOUNTER — Other Ambulatory Visit: Payer: Self-pay

## 2018-10-21 VITALS — BP 132/66 | HR 78 | Temp 98.0°F | Ht 67.0 in | Wt 278.8 lb

## 2018-10-21 DIAGNOSIS — G4733 Obstructive sleep apnea (adult) (pediatric): Secondary | ICD-10-CM

## 2018-10-21 DIAGNOSIS — R7303 Prediabetes: Secondary | ICD-10-CM

## 2018-10-21 DIAGNOSIS — Z23 Encounter for immunization: Secondary | ICD-10-CM

## 2018-10-21 DIAGNOSIS — I10 Essential (primary) hypertension: Secondary | ICD-10-CM

## 2018-10-21 DIAGNOSIS — Z0000 Encounter for general adult medical examination without abnormal findings: Secondary | ICD-10-CM | POA: Diagnosis not present

## 2018-10-21 DIAGNOSIS — E785 Hyperlipidemia, unspecified: Secondary | ICD-10-CM | POA: Diagnosis not present

## 2018-10-21 DIAGNOSIS — Z124 Encounter for screening for malignant neoplasm of cervix: Secondary | ICD-10-CM

## 2018-10-21 DIAGNOSIS — Z1239 Encounter for other screening for malignant neoplasm of breast: Secondary | ICD-10-CM | POA: Diagnosis not present

## 2018-10-21 LAB — POCT GLYCOSYLATED HEMOGLOBIN (HGB A1C): Hemoglobin A1C: 5.8 % — AB (ref 4.0–5.6)

## 2018-10-21 MED ORDER — ATORVASTATIN CALCIUM 40 MG PO TABS: 40.0000 mg | ORAL_TABLET | Freq: Every day | ORAL | 3 refills | Status: DC

## 2018-10-21 MED ORDER — OLOPATADINE HCL 0.2 % OP SOLN: 1.0000 [drp] | Freq: Every day | OPHTHALMIC | 5 refills | Status: DC

## 2018-10-21 MED ORDER — GABAPENTIN 300 MG PO CAPS: 300.0000 mg | ORAL_CAPSULE | Freq: Three times a day (TID) | ORAL | 0 refills | Status: DC

## 2018-10-21 MED ORDER — POLYETHYLENE GLYCOL 3350 17 GM/SCOOP PO POWD: 17.0000 g | Freq: Two times a day (BID) | ORAL | 1 refills | Status: AC | PRN

## 2018-10-21 MED ORDER — LISINOPRIL 10 MG PO TABS: 10.0000 mg | ORAL_TABLET | Freq: Every day | ORAL | 5 refills | Status: DC

## 2018-10-21 MED ORDER — NYSTATIN 100000 UNIT/GM EX POWD: Freq: Four times a day (QID) | CUTANEOUS | 0 refills | Status: DC

## 2018-10-21 MED ORDER — FLUTICASONE PROPIONATE 50 MCG/ACT NA SUSP: 2.0000 | Freq: Every day | NASAL | 6 refills | Status: DC

## 2018-10-21 MED ORDER — METFORMIN HCL 500 MG PO TABS: 500.0000 mg | ORAL_TABLET | Freq: Two times a day (BID) | ORAL | 3 refills | Status: DC

## 2018-10-21 MED ORDER — CETIRIZINE HCL 10 MG PO TABS: 10.0000 mg | ORAL_TABLET | Freq: Every day | ORAL | 11 refills | Status: DC

## 2018-10-21 MED FILL — metFORMIN HCL 500 MG TABS: 500 | 30 days supply | Qty: 60 | Fill #0

## 2018-10-21 MED FILL — GABAPENTIN 300 MG CAPSULE: 300 | 30 days supply | Qty: 90 | Fill #0

## 2018-10-21 MED FILL — ATORVASTATIN 40 MG TABLET: 40 | 30 days supply | Qty: 30 | Fill #0

## 2018-10-21 MED FILL — NYSTATIN 100000 UNIT/GM POW: 100000 | 7 days supply | Qty: 15 | Fill #0

## 2018-10-21 MED FILL — LISINOPRIL 10 MG TABS: 10 | 30 days supply | Qty: 30 | Fill #0

## 2018-10-21 NOTE — Patient Instructions (Signed)
Preventive Care 40-64 Years, Female Preventive care refers to lifestyle choices and visits with your health care provider that can promote health and wellness. What does preventive care include?   A yearly physical exam. This is also called an annual well check.  Dental exams once or twice a year.  Routine eye exams. Ask your health care provider how often you should have your eyes checked.  Personal lifestyle choices, including: ? Daily care of your teeth and gums. ? Regular physical activity. ? Eating a healthy diet. ? Avoiding tobacco and drug use. ? Limiting alcohol use. ? Practicing safe sex. ? Taking low-dose aspirin daily starting at age 50. ? Taking vitamin and mineral supplements as recommended by your health care provider. What happens during an annual well check? The services and screenings done by your health care provider during your annual well check will depend on your age, overall health, lifestyle risk factors, and family history of disease. Counseling Your health care provider may ask you questions about your:  Alcohol use.  Tobacco use.  Drug use.  Emotional well-being.  Home and relationship well-being.  Sexual activity.  Eating habits.  Work and work environment.  Method of birth control.  Menstrual cycle.  Pregnancy history. Screening You may have the following tests or measurements:  Height, weight, and BMI.  Blood pressure.  Lipid and cholesterol levels. These may be checked every 5 years, or more frequently if you are over 50 years old.  Skin check.  Lung cancer screening. You may have this screening every year starting at age 55 if you have a 30-pack-year history of smoking and currently smoke or have quit within the past 15 years.  Colorectal cancer screening. All adults should have this screening starting at age 50 and continuing until age 75. Your health care provider may recommend screening at age 45. You will have tests every  1-10 years, depending on your results and the type of screening test. People at increased risk should start screening at an earlier age. Screening tests may include: ? Guaiac-based fecal occult blood testing. ? Fecal immunochemical test (FIT). ? Stool DNA test. ? Virtual colonoscopy. ? Sigmoidoscopy. During this test, a flexible tube with a tiny camera (sigmoidoscope) is used to examine your rectum and lower colon. The sigmoidoscope is inserted through your anus into your rectum and lower colon. ? Colonoscopy. During this test, a long, thin, flexible tube with a tiny camera (colonoscope) is used to examine your entire colon and rectum.  Hepatitis C blood test.  Hepatitis B blood test.  Sexually transmitted disease (STD) testing.  Diabetes screening. This is done by checking your blood sugar (glucose) after you have not eaten for a while (fasting). You may have this done every 1-3 years.  Mammogram. This may be done every 1-2 years. Talk to your health care provider about when you should start having regular mammograms. This may depend on whether you have a family history of breast cancer.  BRCA-related cancer screening. This may be done if you have a family history of breast, ovarian, tubal, or peritoneal cancers.  Pelvic exam and Pap test. This may be done every 3 years starting at age 21. Starting at age 30, this may be done every 5 years if you have a Pap test in combination with an HPV test.  Bone density scan. This is done to screen for osteoporosis. You may have this scan if you are at high risk for osteoporosis. Discuss your test results, treatment options,   and if necessary, the need for more tests with your health care provider. Vaccines Your health care provider may recommend certain vaccines, such as:  Influenza vaccine. This is recommended every year.  Tetanus, diphtheria, and acellular pertussis (Tdap, Td) vaccine. You may need a Td booster every 10 years.  Varicella  vaccine. You may need this if you have not been vaccinated.  Zoster vaccine. You may need this after age 38.  Measles, mumps, and rubella (MMR) vaccine. You may need at least one dose of MMR if you were born in 1957 or later. You may also need a second dose.  Pneumococcal 13-valent conjugate (PCV13) vaccine. You may need this if you have certain conditions and were not previously vaccinated.  Pneumococcal polysaccharide (PPSV23) vaccine. You may need one or two doses if you smoke cigarettes or if you have certain conditions.  Meningococcal vaccine. You may need this if you have certain conditions.  Hepatitis A vaccine. You may need this if you have certain conditions or if you travel or work in places where you may be exposed to hepatitis A.  Hepatitis B vaccine. You may need this if you have certain conditions or if you travel or work in places where you may be exposed to hepatitis B.  Haemophilus influenzae type b (Hib) vaccine. You may need this if you have certain conditions. Talk to your health care provider about which screenings and vaccines you need and how often you need them. This information is not intended to replace advice given to you by your health care provider. Make sure you discuss any questions you have with your health care provider. Document Released: 09/02/2015 Document Revised: 09/26/2017 Document Reviewed: 06/07/2015 Elsevier Interactive Patient Education  2019 [employer].

## 2018-10-21 NOTE — Progress Notes (Signed)
[name], female   DOB: February 17, 1961, 58 y.o.   MRN: [id number] 58 y.o. year old female presents for well woman/preventative visit and annual GYN examination.  Acute Concerns: Sinus drainage  Concerns of weight loss, mother and father both had DM  H/o OSA - Wakes up at night. No CPAP machine right now, but has had one in the past. Wants another sleep study  Diet and exercise: Drinks multiple MT Dew a day, no time to exercise works 2 jobs. Has a Ventral hernia, and "needs ankle surgery" due to "weak ankles", but was told she needs to loose weight. Has 9 grand children, wants to live to old age with those.  Wants to try medical mgmt and nutrition therapy for 3 mo then re assess   Birth Control: Histerectomy    Medication refills Refilled as ordered.   Surgical History: Past Surgical History:  Procedure Laterality Date  . ABDOMINAL HYSTERECTOMY  2004   "partial"  . BACK SURGERY  04,06   x2  . BILATERAL OOPHORECTOMY  2010   2/2 malignancy  . CARPAL TUNNEL RELEASE Bilateral ?1999  . CESAREAN SECTION  1989  . LAMINECTOMY AND MICRODISCECTOMY LUMBAR SPINE  2004; 2006  . TOTAL KNEE ARTHROPLASTY Left 10/06/2013   Procedure: TOTAL KNEE ARTHROPLASTY;  Surgeon: Sheral Apley, MD;  Location: [facility] OR;  Service: Orthopedics;  Laterality: Left;  . TOTAL KNEE ARTHROPLASTY Right 11/09/2013   Procedure: RIGHT TOTAL KNEE ARTHROPLASTY;  Surgeon: Sheral Apley, MD;  Location: [facility] OR;  Service: Orthopedics;  Laterality: Right;    Allergies: Allergies  Allergen Reactions  . Allopurinol Other (See Comments)    Caused hair to fall out  . Lubiprostone Nausea Only  . Naproxen Hives  . Oxycodone Nausea Only    Can take with zofran    Social:  Social History   Socioeconomic History  . Marital status: Single    Spouse name: Not on file  . Number of children: Not on file  . Years of education: Not on file  . Highest education level: Not on file  Occupational History  . Not  on file  Social Needs  . Financial resource strain: Not on file  . Food insecurity:    Worry: Not on file    Inability: Not on file  . Transportation needs:    Medical: Not on file    Non-medical: Not on file  Tobacco Use  . Smoking status: Never Smoker  . Smokeless tobacco: Never Used  Substance and Sexual Activity  . Alcohol use: No  . Drug use: No  . Sexual activity: Not on file  Lifestyle  . Physical activity:    Days per week: Not on file    Minutes per session: Not on file  . Stress: Not on file  Relationships  . Social connections:    Talks on phone: Not on file    Gets together: Not on file    Attends religious service: Not on file    Active member of club or organization: Not on file    Attends meetings of clubs or organizations: Not on file    Relationship status: Not on file  Other Topics Concern  . 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 Then sits with patients from 4pm-11pm.     Immunization: Immunization History  Administered Date(s) Administered  . Hepatitis B 01/01/2011  .  Influenza Split 06/18/2011  . Influenza-Unspecified 07/07/2013, 05/19/2017  . PPD Test 12/22/2010, 01/05/2011, 10/19/2011  . Pneumococcal-Unspecified 06/20/2013  . Td 08/20/1998  . Tdap 12/22/2010    Cancer Screening:  Pap Smear: perfomed today, results pending  Mammogram: referral placed. Mother w/ h/o breast cancer s/p double mastectomy in 2019  Colonoscopy: 2011, unable to see results in chart, although there is a report scan. Per patient, no concerning issues. Patient is due colonoscopy next year   Physical Exam: VITALS: Reviewed GEN: Pleasant female, NAD HEENT: Normocephalic, PERRL, EOMI, no scleral icterus, bilateral TM pearly grey, nasal septum midline, MMM, uvula midline, no anterior or posterior lymphadenopathy, no thyromegaly CARDIAC:RRR, S1 and S2 present, no murmur, no  heaves/thrills RESP: CTAB, normal effort BREAST: deferred ABD: Soft, no tenderness, normal bowel sounds, large ventral hernia, easily reducible GU/GYN:Exam performed in the presence of a chaperone. Normal external genitalia. Cervix unremarkable. Bimanual exam identified no masses EXT: No edema, 2+ radial and DP pulses SKIN: Warm and dry, no rash  ASSESSMENT & PLAN: 58 y.o. female presents for annual well woman/preventative exam and GYN exam. For obesity management, plan 3 mo of nutrition therapy. Plan to also start metformin. Pt has h/o pre-DM and strong family history. She is also considering bariatric surgery if no noticeable improvement. F/u in 3 mo. Also plan to recheck A1C.   H/o HLD - recheck lipid panel  H/o HTN - well controlled off of medications. Will check renal function with BMP  Pt has restless leg w/ symptoms managed well with gabapentin  Refilled medications as below  H/o of OSA, but no CPAP machine in years. Will do home sleep study for re-eval.   Chronic Allergic sinus congestion - refill of home flonase.    Problem List Items Addressed This Visit      Cardiovascular and Mediastinum   HYPERTENSION, BENIGN SYSTEMIC   Relevant Medications   atorvastatin (LIPITOR) 40 MG tablet     Other   OBESITY, MORBID   Relevant Medications   metFORMIN (GLUCOPHAGE) 500 MG tablet   Other Relevant Orders   Amb ref to Medical Nutrition Therapy-MNT    Other Visit Diagnoses    Annual physical exam    -  Primary   Screening for breast cancer       Relevant Orders   MM Digital Screening   Essential hypertension       Relevant Medications   atorvastatin (LIPITOR) 40 MG tablet   Other Relevant Orders   Basic Metabolic Panel   Hyperlipidemia, unspecified hyperlipidemia type       Relevant Medications   atorvastatin (LIPITOR) 40 MG tablet   Other Relevant Orders   Lipid Panel   Prediabetes       Relevant Medications   metFORMIN (GLUCOPHAGE) 500 MG tablet   Other Relevant  Orders   POCT glycosylated hemoglobin (Hb A1C)   Amb ref to Medical Nutrition Therapy-MNT   Routine general medical examination at a health care facility       Screening for malignant neoplasm of cervix       Relevant Orders   Cytology - PAP([facility])   OSA (obstructive sleep apnea)       Relevant Orders   Home sleep test       Garnette Gunner, MD PGY-2 [name] Pager [phone]

## 2018-10-22 ENCOUNTER — Encounter (HOSPITAL_COMMUNITY): Payer: Self-pay | Admitting: Family Medicine

## 2018-10-22 LAB — BASIC METABOLIC PANEL
BUN / CREAT RATIO: 13 (ref 9–23)
BUN: 11 mg/dL (ref 6–24)
CALCIUM: 9.3 mg/dL (ref 8.7–10.2)
CHLORIDE: 103 mmol/L (ref 96–106)
CO2: 23 mmol/L (ref 20–29)
Creatinine, Ser: 0.82 mg/dL (ref 0.57–1.00)
GFR calc non Af Amer: 79 mL/min/{1.73_m2} (ref >59)
GFR, EST AFRICAN AMERICAN: 91 mL/min/{1.73_m2} (ref >59)
GLUCOSE: 103 mg/dL — AB (ref 65–99)
POTASSIUM: 4.6 mmol/L (ref 3.5–5.2)
Sodium: 142 mmol/L (ref 134–144)

## 2018-10-22 LAB — LIPID PANEL
CHOLESTEROL TOTAL: 222 mg/dL — AB (ref 100–199)
Chol/HDL Ratio: 5 ratio — ABNORMAL HIGH (ref 0.0–4.4)
HDL: 44 mg/dL (ref >39)
LDL Calculated: 155 mg/dL — ABNORMAL HIGH (ref 0–99)
Triglycerides: 117 mg/dL (ref 0–149)
VLDL Cholesterol Cal: 23 mg/dL (ref 5–40)

## 2018-10-22 NOTE — Progress Notes (Signed)
The 10-year ASCVD risk score Denman George DC Montez Hageman., et al., 2013) is: 8.9%   Values used to calculate the score:     Age: 58 years     Sex: Female     Is Non-Hispanic African American: Yes     Diabetic: No     Tobacco smoker: No     Systolic Blood Pressure: 132 mmHg     Is BP treated: Yes     HDL Cholesterol: 44 mg/dL     Total Cholesterol: 222 mg/dL  ASCVD is > 7.5 but less than 10. Will discuss starting statin at next visit. Informing patient by letter w/ lab results.

## 2018-10-23 LAB — CYTOLOGY - PAP
Diagnosis: NEGATIVE
HPV: NOT DETECTED

## 2018-11-03 ENCOUNTER — Other Ambulatory Visit: Payer: Self-pay

## 2018-11-03 ENCOUNTER — Ambulatory Visit
Admission: RE | Admit: 2018-11-03 | Discharge: 2018-11-03 | Disposition: A | Discharge status: Home / Self Care | Payer: 59 | Source: Ambulatory Visit | Attending: Family Medicine | Admitting: Family Medicine

## 2018-11-03 ENCOUNTER — Ambulatory Visit: Payer: 59

## 2018-11-03 DIAGNOSIS — Z1239 Encounter for other screening for malignant neoplasm of breast: Secondary | ICD-10-CM

## 2018-11-23 ENCOUNTER — Telehealth: Payer: Self-pay | Admitting: Family Medicine

## 2018-11-23 NOTE — Telephone Encounter (Signed)
**  After Hours/ Emergency Line Call**  Received a call to report that [name] fever, cough and SOB.  Patient works in Teacher, music, hospice aid. On Monday temp was 99.8. Patient continues to feel hot. Temp was elevated up to 103 and it comes down to 99. Patient has persistent cough with sputum production. Patient has been taking tylenol and nyquil which break fever but after medication wears off fever returns. Reports shortness of breath. Does endorse chest pain under left breast. Recent visitor at hospice center was a physician in [place] and he had COVID diagnosis. During phone call patient appeared to be very winded on the phone during normal conversation. I discussed this with patient who states she is feeling SOB and states for the past few days she can barely get sentences out. I recommended that patient come to ED as she may be hypoxic. States she has a ride to drive her to ED and did not want EMS. Red flags discussed.  Will forward to PCP.  [name], DO PGY-2, [facility] 11/23/2018 7:52 PM

## 2018-11-24 ENCOUNTER — Emergency Department (HOSPITAL_COMMUNITY)
Admission: EM | Admit: 2018-11-24 | Discharge: 2018-11-24 | Disposition: A | Discharge status: Home / Self Care | Payer: 59 | Attending: Emergency Medicine | Admitting: Emergency Medicine

## 2018-11-24 ENCOUNTER — Encounter (HOSPITAL_COMMUNITY): Payer: Self-pay | Admitting: Emergency Medicine

## 2018-11-24 ENCOUNTER — Emergency Department (HOSPITAL_COMMUNITY): Payer: 59

## 2018-11-24 ENCOUNTER — Other Ambulatory Visit: Payer: Self-pay

## 2018-11-24 DIAGNOSIS — J181 Lobar pneumonia, unspecified organism: Secondary | ICD-10-CM | POA: Diagnosis not present

## 2018-11-24 DIAGNOSIS — J189 Pneumonia, unspecified organism: Secondary | ICD-10-CM

## 2018-11-24 DIAGNOSIS — Z7189 Other specified counseling: Secondary | ICD-10-CM

## 2018-11-24 DIAGNOSIS — Z7984 Long term (current) use of oral hypoglycemic drugs: Secondary | ICD-10-CM | POA: Diagnosis not present

## 2018-11-24 DIAGNOSIS — R509 Fever, unspecified: Secondary | ICD-10-CM | POA: Diagnosis present

## 2018-11-24 DIAGNOSIS — Z79899 Other long term (current) drug therapy: Secondary | ICD-10-CM | POA: Insufficient documentation

## 2018-11-24 DIAGNOSIS — I10 Essential (primary) hypertension: Secondary | ICD-10-CM | POA: Diagnosis not present

## 2018-11-24 LAB — COMPREHENSIVE METABOLIC PANEL
ALT: 17 U/L (ref 0–44)
AST: 20 U/L (ref 15–41)
Albumin: 3.8 g/dL (ref 3.5–5.0)
Alkaline Phosphatase: 51 U/L (ref 38–126)
Anion gap: 12 (ref 5–15)
BUN: <5 mg/dL — ABNORMAL LOW (ref 6–20)
CO2: 23 mmol/L (ref 22–32)
Calcium: 8.6 mg/dL — ABNORMAL LOW (ref 8.9–10.3)
Chloride: 105 mmol/L (ref 98–111)
Creatinine, Ser: 0.86 mg/dL (ref 0.44–1.00)
GFR calc Af Amer: >60 mL/min (ref >60)
GFR calc non Af Amer: >60 mL/min (ref >60)
Glucose, Bld: 99 mg/dL (ref 70–99)
Potassium: 3.3 mmol/L — ABNORMAL LOW (ref 3.5–5.1)
Sodium: 140 mmol/L (ref 135–145)
Total Bilirubin: 0.4 mg/dL (ref 0.3–1.2)
Total Protein: 6.6 g/dL (ref 6.5–8.1)

## 2018-11-24 LAB — CBC WITH DIFFERENTIAL/PLATELET
Abs Immature Granulocytes: 0.01 10*3/uL (ref 0.00–0.07)
Basophils Absolute: 0 10*3/uL (ref 0.0–0.1)
Basophils Relative: 0 %
Eosinophils Absolute: 0 10*3/uL (ref 0.0–0.5)
Eosinophils Relative: 0 %
HCT: 42 % (ref 36.0–46.0)
Hemoglobin: 13.3 g/dL (ref 12.0–15.0)
Immature Granulocytes: 0 %
Lymphocytes Relative: 45 %
Lymphs Abs: 1.6 10*3/uL (ref 0.7–4.0)
MCH: 30.1 pg (ref 26.0–34.0)
MCHC: 31.7 g/dL (ref 30.0–36.0)
MCV: 95 fL (ref 80.0–100.0)
Monocytes Absolute: 0.3 10*3/uL (ref 0.1–1.0)
Monocytes Relative: 8 %
Neutro Abs: 1.6 10*3/uL — ABNORMAL LOW (ref 1.7–7.7)
Neutrophils Relative %: 47 %
Platelets: 141 10*3/uL — ABNORMAL LOW (ref 150–400)
RBC: 4.42 MIL/uL (ref 3.87–5.11)
RDW: 12.6 % (ref 11.5–15.5)
WBC: 3.5 10*3/uL — ABNORMAL LOW (ref 4.0–10.5)
nRBC: 0 % (ref 0.0–0.2)

## 2018-11-24 LAB — TROPONIN I: Troponin I: <0.03 ng/mL (ref <0.03)

## 2018-11-24 LAB — LACTIC ACID, PLASMA: Lactic Acid, Venous: 1 mmol/L (ref 0.5–1.9)

## 2018-11-24 MED ORDER — AZITHROMYCIN 250 MG PO TABS: 250.0000 mg | ORAL_TABLET | Freq: Every day | ORAL | 0 refills | Status: DC

## 2018-11-24 MED ORDER — POTASSIUM CHLORIDE CRYS ER 20 MEQ PO TBCR
40.0000 meq | EXTENDED_RELEASE_TABLET | Freq: Once | ORAL | Status: AC
  Administered 2018-11-24: 40 meq via ORAL
  Filled 2018-11-24: qty 2

## 2018-11-24 NOTE — ED Triage Notes (Signed)
Pt here from home. Pt complains of cough and fever x1 week.

## 2018-11-24 NOTE — ED Notes (Signed)
Patient verbalizes understanding of discharge instructions. Opportunity for questioning and answers were provided. Armband removed by staff, pt discharged from ED via wheelchair to home.  

## 2018-11-24 NOTE — ED Provider Notes (Signed)
[facility] EMERGENCY DEPARTMENT Provider Note   CSN: [id number] Arrival date & time: 11/24/18  1605    History   Chief Complaint No chief complaint on file.   HPI [name] is a 58 y.o. female.     HPI   One week ago, temp was 99.8, went home and started taking tylenol, kept feeling weaker, and fever started increasing to 103, took theraflu, tylenol sinus pills, not sure if sick from pollen, but still can't talk much without coughing. Had these symptoms before, pneumonia-like pneumonia, thought maybe just cough or cold.  Feeling badly, coughing so much can't catch breath, hurt all over. In AM having some blood tinged sputum.  Thought maybe had pneumonia or other URI.  Low appetite, feeling badly. This AM temp was 101, came down then increased.  Fever every day for a week.  Tried to drink gatorade, water, lemon juice, trying to eat but not much of an appetite.  Feels short of breath with conversation.  If gets to breathing too fast can't catch breath.    Nausea, no vomiting. Diarrhea for one week.  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  Started coughing on her 2-3 weeks ago.  Daughter's husband had it, lady was around him but she herself wasn't having symptoms.   Lives with man who is currently admitted to the hospital for similar symptoms.   NO hx of smoking or lung disease No hx of DVT, PE, no recent surgeries, no long trips  Past Medical History:  Diagnosis Date  . Arthritis   . Fatty liver   . GERD (gastroesophageal reflux disease)   . Hypertension   . PONV (postoperative nausea and vomiting)    with pain med  . Sleep apnea    previously no longer neg sleep study  . Thoracic nerve root impingement 08-05/2010   Severe right flank pain. Resolved with thoracic epidural steroid injection.   Marland Kitchen Umbilical hernia   . UTI (lower urinary tract infection)    recent- unable to finish med only took 2 days    Patient Active  Problem List   Diagnosis Date Noted  . Otitis, externa, infective 06/04/2017  . Nasal congestion 06/04/2017  . Intertrigo 12/25/2016  . Ventral hernia 10/31/2015  . Healthcare maintenance 10/31/2015  . Flashing lights 04/07/2015  . Blurry vision, left eye 04/07/2015  . Vertigo 02/24/2014  . Macromastia 12/19/2013  . Arthritis of knee 10/06/2013  . Leg cramping 01/29/2013  . Seasonal allergies 01/04/2012  . OBESITY, MORBID 09/26/2010  . GERD 10/18/2009  . BACK PAIN, CHRONIC 05/14/2008  . SLEEP APNEA, OBSTRUCTIVE, SEVERE 11/10/2006  . HYPERTENSION, BENIGN SYSTEMIC 10/17/2006  . OSTEOARTHRITIS, LOWER LEG 10/17/2006    Past Surgical History:  Procedure Laterality Date  . ABDOMINAL HYSTERECTOMY  2004   "partial"  . BACK SURGERY  04,06   x2  . BILATERAL OOPHORECTOMY  2010   2/2 malignancy  . CARPAL TUNNEL RELEASE Bilateral ?1999  . CESAREAN SECTION  1989  . LAMINECTOMY AND MICRODISCECTOMY LUMBAR SPINE  2004; 2006  . TOTAL KNEE ARTHROPLASTY Left 10/06/2013   Procedure: TOTAL KNEE ARTHROPLASTY;  Surgeon: Sheral Apley, MD;  Location: [facility] OR;  Service: Orthopedics;  Laterality: Left;  . TOTAL KNEE ARTHROPLASTY Right 11/09/2013   Procedure: RIGHT TOTAL KNEE ARTHROPLASTY;  Surgeon: Sheral Apley, MD;  Location: [facility] OR;  Service: Orthopedics;  Laterality: Right;     OB History   No obstetric history on file.  Home Medications    Prior to Admission medications   Medication Sig Start Date End Date Taking? Authorizing Provider  atorvastatin (LIPITOR) 40 MG tablet Take 1 tablet (40 mg total) by mouth daily. 10/21/18  Yes Garnette Gunner, MD  cetirizine (ZYRTEC) 10 MG tablet Take 1 tablet (10 mg total) by mouth daily. 10/21/18  Yes Garnette Gunner, MD  fluticasone (FLONASE) 50 MCG/ACT nasal spray Place 2 sprays into both nostrils daily. 10/21/18  Yes Garnette Gunner, MD  gabapentin (NEURONTIN) 300 MG capsule Take 1 capsule (300 mg total) by mouth 3 (three) times daily. 10/21/18   Yes Garnette Gunner, MD  lisinopril (PRINIVIL,ZESTRIL) 10 MG tablet Take 10 mg by mouth daily.  10/21/18  Yes [provider]  metFORMIN (GLUCOPHAGE) 500 MG tablet Take 1 tablet (500 mg total) by mouth 2 (two) times daily with a meal. 10/21/18  Yes Garnette Gunner, MD  nystatin (MYCOSTATIN/NYSTOP) powder Apply topically 4 (four) times daily. Patient taking differently: Apply topically 4 (four) times daily. AS DIRECTED- TO AFFECTED AREAS 10/21/18  Yes Garnette Gunner, MD  polyethylene glycol powder (GLYCOLAX/MIRALAX) powder Take 17 g by mouth 2 (two) times daily as needed. Patient taking differently: Take 17 g by mouth 2 (two) times daily as needed (for constipation).  10/21/18  Yes Garnette Gunner, MD  azithromycin (ZITHROMAX) 250 MG tablet Take 1 tablet (250 mg total) by mouth daily. Take first 2 tablets together, then 1 every day until finished. 11/24/18   Alvira Monday, MD  Olopatadine HCl 0.2 % SOLN Apply 1 drop to eye daily. Patient not taking: Reported on 11/24/2018 10/21/18   Garnette Gunner, MD  sucralfate (CARAFATE) 1 G tablet Take 1 tablet (1 g total) by mouth 4 (four) times daily. 12/26/10 11/02/11  Jonnie Kind, MD    Family History No family history on file.  Social History Social History   Tobacco Use  . Smoking status: Never Smoker  . Smokeless tobacco: Never Used  Substance Use Topics  . Alcohol use: No  . Drug use: No     Allergies   Allopurinol; Lubiprostone; Naproxen; Other; and Oxycodone   Review of Systems Review of Systems  Constitutional: Positive for appetite change, fatigue and fever.  HENT: Negative for sore throat.   Eyes: Negative for visual disturbance.  Respiratory: Positive for cough and shortness of breath.   Cardiovascular: Negative for chest pain and leg swelling.  Gastrointestinal: Positive for diarrhea and nausea. Negative for abdominal pain and vomiting.  Genitourinary: Negative for difficulty urinating and dysuria.   Musculoskeletal: Positive for arthralgias and myalgias. Negative for back pain and neck pain.  Skin: Negative for rash.  Neurological: Positive for headaches (with coughing). Negative for syncope.     Physical Exam Updated Vital Signs BP (!) 142/80   Pulse 76   Temp 99.3 F (37.4 C) (Oral)   Resp (!) 23   Ht  (1.676 m)   Wt 127 kg   SpO2 96%   BMI 45.19 kg/m   Physical Exam Vitals signs and nursing note reviewed.  Constitutional:      General: She is not in acute distress.    Appearance: She is well-developed. She is not diaphoretic.  HENT:     Head: Normocephalic and atraumatic.  Eyes:     Conjunctiva/sclera: Conjunctivae normal.  Neck:     Musculoskeletal: Normal range of motion.  Cardiovascular:     Rate and Rhythm: Normal rate and regular rhythm.  Heart sounds: Normal heart sounds. No murmur. No friction rub. No gallop.   Pulmonary:     Effort: Pulmonary effort is normal. No respiratory distress.     Breath sounds: Normal breath sounds. No wheezing or rales.  Abdominal:     General: There is no distension.     Palpations: Abdomen is soft.     Tenderness: There is no abdominal tenderness. There is no guarding.  Musculoskeletal:        General: No tenderness.  Skin:    General: Skin is warm and dry.     Findings: No erythema or rash.  Neurological:     Mental Status: She is alert and oriented to person, place, and time.      ED Treatments / Results  Labs (all labs ordered are listed, but only abnormal results are displayed) Labs Reviewed  CBC WITH DIFFERENTIAL/PLATELET - Abnormal; Notable for the following components:      Result Value   WBC 3.5 (*)    Platelets 141 (*)    Neutro Abs 1.6 (*)    All other components within normal limits  COMPREHENSIVE METABOLIC PANEL - Abnormal; Notable for the following components:   Potassium 3.3 (*)    BUN <5 (*)    Calcium 8.6 (*)    All other components within normal limits  CULTURE, BLOOD (ROUTINE X 2)   CULTURE, BLOOD (ROUTINE X 2)  TROPONIN I  LACTIC ACID, PLASMA    EKG EKG Interpretation  Date/Time:  Monday November 24 2018 16:19:23 EDT Ventricular Rate:  78 PR Interval:    QRS Duration: 90 QT Interval:  376 QTC Calculation: 429 R Axis:   69 Text Interpretation:  Sinus rhythm Probable left atrial enlargement No significant change since last tracing Confirmed by Alvira Monday ([id number]) on 11/24/2018 4:50:16 PM   Radiology Dg Chest Portable 1 View  Result Date: 11/24/2018 CLINICAL DATA:  Cough and fever over the last week. EXAM: PORTABLE CHEST 1 VIEW COMPARISON:  12/05/2016 FINDINGS: Mild cardiomegaly. Mediastinal shadows otherwise normal. Chronic appearing scarring at the right lung base. These markings may be slightly more prominent which may represent progressive scarring or some active atelectasis or mild patchy basilar infiltrate. No sign of lobar consolidation, collapse or effusion. IMPRESSION: Chronic markings at the right lung base, which appear more prominent when compared to the study of April 2018. This could represent chronic scarring which is progressive or there could be some active atelectasis or patchy infiltrate at the right lung base. Electronically Signed   By: Paulina Fusi M.D.   On: 11/24/2018 17:52    Procedures Procedures (including critical care time)  Medications Ordered in ED Medications  potassium chloride SA (K-DUR,KLOR-CON) CR tablet 40 mEq (40 mEq Oral Given 11/24/18 1940)     Initial Impression / Assessment and Plan / ED Course  I have reviewed the triage vital signs and the nursing notes.  Pertinent labs & imaging results that were available during my care of the patient were reviewed by me and considered in my medical decision making (see chart for details).        58yo female with history of htn, suspected OSA, who presents with cough, shortness of breath and fever.  CBC with WBC 3.5.  Mild hypokalemia, given K.  Troponin negative.  CXR shows  possible patchy infiltrate at the right base.  No hypoxia, normal lactic acid.  Breathing comfortable on my exam, tachypnea with coughing.  Stable for outpatient treatment and strict return precautions.  Given rx for azithromycin for CAP.  Discussed additional concern for COVID19 given sick contact, constellation of symptoms and recommend strict quarantine. Patient discharged in stable condition with understanding of reasons to return.   [facility] ErmBonnie Holdren was evaluated in Emergency Department on 4/6 for the symptoms described in the history of present illness. He/she was evaluated in the context of the global COVID-19 pandemic, which necessitated consideration that the patient might be at risk for infection with the SARS-CoV-2 virus that causes COVID-19. Institutional protocols and algorithms that pertain to the evaluation of patients at risk for COVID-19 are in a state of rapid change based on information released by regulatory bodies including the CDC and federal and state organizations. These policies and algorithms were followed during the patient's care in the ED.    Final Clinical Impressions(s) / ED Diagnoses   Final diagnoses:  Community acquired pneumonia of right lower lobe of lung (HCC)  Educated About Covid-19 Virus Infection    ED Discharge Orders         Ordered    azithromycin (ZITHROMAX) 250 MG tablet  Daily,   Status:  Discontinued     11/24/18 1907    azithromycin (ZITHROMAX) 250 MG tablet  Daily     11/24/18 1907           Alvira MondaySchlossman, Linzy Laury, MD 11/25/18 0045

## 2018-11-25 ENCOUNTER — Telehealth (HOSPITAL_BASED_OUTPATIENT_CLINIC_OR_DEPARTMENT_OTHER): Payer: Self-pay | Admitting: *Deleted

## 2018-11-25 LAB — BLOOD CULTURE ID PANEL (REFLEXED)

## 2018-11-27 ENCOUNTER — Telehealth: Payer: Self-pay | Admitting: *Deleted

## 2018-11-27 LAB — CULTURE, BLOOD (ROUTINE X 2): Special Requests: ADEQUATE

## 2018-11-27 MED FILL — AZITHROMYCIN 250 MG TABLET: 250 | 5 days supply | Qty: 6 | Fill #0

## 2018-11-27 MED FILL — GABAPENTIN 300 MG CAPSULE: 300 | 30 days supply | Qty: 90 | Fill #0

## 2018-11-27 NOTE — Telephone Encounter (Signed)
Pharmacy called regarding ICD code for pt resent admission.  EDCM gave J18.1 .

## 2018-11-28 ENCOUNTER — Telehealth: Payer: Self-pay

## 2018-11-28 NOTE — Telephone Encounter (Signed)
Post ED Visit - Positive Culture Follow-up  Culture report reviewed by antimicrobial stewardship pharmacist: [name] []  Enzo Bi, Pharm.D. []  Celedonio Miyamoto, Pharm.D., BCPS AQ-ID []  Garvin Fila, Pharm.D., BCPS []  Georgina Pillion, [address].D., BCPS []  [place], [place].D., BCPS, AAHIVP []  Estella Husk, Pharm.D., BCPS, AAHIVP []  Lysle Pearl, PharmD, BCPS []  Phillips Climes, PharmD, BCPS []  Agapito Games, PharmD, BCPS []  Verlan Friends, PharmD []  Mervyn Gay, PharmD, BCPS []  Vinnie Level, PharmD [facility] []  Len Childs, PharmD []  Greer Pickerel, PharmD []  Adalberto Cole, PharmD []  Perlie Gold, Rph []  Lonell Face) Jean Rosenthal, PharmD []  Earl Many, PharmD []  Junita Push, PharmD []  Dorna Leitz, PharmD []  Terrilee Files, PharmD []  Lynann Beaver, PharmD []  Keturah Barre, PharmD []  Loralee Pacas, PharmD []  Bernadene Person, PharmD   Positive Blood culture  and no further patient follow-up is required at this time.  Jerry Caras 11/28/2018, 8:49 AM

## 2018-12-02 ENCOUNTER — Telehealth (INDEPENDENT_AMBULATORY_CARE_PROVIDER_SITE_OTHER): Payer: BLUE CROSS/BLUE SHIELD | Admitting: Family Medicine

## 2018-12-02 ENCOUNTER — Other Ambulatory Visit: Payer: Self-pay

## 2018-12-02 DIAGNOSIS — R053 Chronic cough: Secondary | ICD-10-CM | POA: Insufficient documentation

## 2018-12-02 DIAGNOSIS — R05 Cough: Secondary | ICD-10-CM | POA: Diagnosis not present

## 2018-12-02 DIAGNOSIS — R059 Cough, unspecified: Secondary | ICD-10-CM

## 2018-12-02 HISTORY — DX: Chronic cough: R05.3

## 2018-12-02 MED ORDER — BENZONATATE 100 MG PO CAPS: 100.0000 mg | ORAL_CAPSULE | Freq: Two times a day (BID) | ORAL | 0 refills | Status: DC | PRN

## 2018-12-02 NOTE — Assessment & Plan Note (Signed)
Likely due to slowly resolving viral pneumonia reportedly covid positive (I do not see a test in her labs).  Will send in tessalon perles.  She seems to be steadily improving but just slowly.  Recommend if any worsening ot call us.  Recommend not working for another week while still has active cough. She will make a follow up video visit next week.

## 2018-12-02 NOTE — Progress Notes (Signed)
Subjective  [name] is a 58 y.o. female is presenting with the following  [facility] Telemedicine Visit  Patient consented to have virtual visit. Method of visit: Video Doxyme  Encounter participants: Patient: [name] - located at home Provider: Carney Living - located at office Others (if applicable): no  Chief Complaint: Cough and Fatigue  HPI:  COUGH Developed CAP seen in ER on 4/6.  She reports was eventually called and told had + Covid test.  She is gradually improving but still feels very fatigued and hot and very persistent cough.  No specific fever and shortness of breath is improving.  No leg swelling or chest pain.  Cough is dry.  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 Alert, communicative  and cooperative with normal attention span and concentration. No apparent delusions, illusions, hallucinations   Assessment/Plan:  Cough Likely due to slowly resolving viral pneumonia reportedly covid positive (I do not see a test in her labs).  Will send in tessalon perles.  She seems to be steadily improving but just slowly.  Recommend if any worsening ot call us.  Recommend not working for another week while still has active cough. She will make a follow up video visit next week.      Time spent during visit with patient: 11 minutes

## 2018-12-11 ENCOUNTER — Other Ambulatory Visit: Payer: Self-pay

## 2018-12-11 ENCOUNTER — Encounter: Payer: Self-pay | Admitting: Family Medicine

## 2018-12-11 ENCOUNTER — Telehealth: Payer: 59

## 2018-12-11 ENCOUNTER — Telehealth: Payer: Self-pay

## 2018-12-11 ENCOUNTER — Telehealth (INDEPENDENT_AMBULATORY_CARE_PROVIDER_SITE_OTHER): Payer: 59 | Admitting: Family Medicine

## 2018-12-11 VITALS — Temp 98.6°F

## 2018-12-11 DIAGNOSIS — R05 Cough: Secondary | ICD-10-CM | POA: Diagnosis not present

## 2018-12-11 DIAGNOSIS — Z20822 Contact with and (suspected) exposure to covid-19: Secondary | ICD-10-CM

## 2018-12-11 DIAGNOSIS — Z20828 Contact with and (suspected) exposure to other viral communicable diseases: Secondary | ICD-10-CM

## 2018-12-11 DIAGNOSIS — R059 Cough, unspecified: Secondary | ICD-10-CM

## 2018-12-11 NOTE — Progress Notes (Signed)
Page Lorin Picket informed me that the patient called back requesting A/B for her cough.  I will prefer she gets an xray to evaluate for pneumonia. If it is negative she is still having symptoms, ED visit recommended.  Per Page, she wants xray.  Xray ordered.

## 2018-12-11 NOTE — Addendum Note (Signed)
Addended by: Janit Pagan T on: 12/11/2018 04:07 PM   Modules accepted: Orders

## 2018-12-11 NOTE — Telephone Encounter (Signed)
Pt called nurse line requesting an antibiotic for continued cough, or a stronger cough medication called in. Spoke with telemedicine provider who suggested pt to continue tessalon pearls and suggested an xray if patient is agreeable. Pt would like to have an xray placed. Pt informed to go to [facility] location for this.

## 2018-12-11 NOTE — Progress Notes (Signed)
[facility] Telemedicine Visit  Patient consented to have virtual visit. Method of visit: Video  Encounter participants: Patient: [name] - located at home Provider: Janit Pagan - located at office Others (if applicable): NA  Chief Complaint: Cough  HPI:  Cough  This is a new problem. The current episode started 1 to 4 weeks ago. The problem has been unchanged (She was seen in the hospital about 2-3 weeks ago and was told she has positive COVID-19. Her husband also tested positive). The problem occurs constantly. The cough is non-productive. Pertinent negatives include no fever, shortness of breath or wheezing. Associated symptoms comments: She needed to catch her breath sometimes. Nothing aggravates the symptoms. Risk factors: Exposure to husband with positive COVID-19 confirmed. Treatments tried: Tessalon prn. The treatment provided moderate relief.  She was asked to get two negative COVID result before she can return to work. 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   ROS: per HPI  Pertinent PMHx: problem list reviewed  Exam:  Respiratory: Not in distress Gen: Well appearing.   Assessment/Plan:  Cough: Concern for COVID-19 I was unable to find any positive test result on file for her. I advised her to continue self isolation for 14 days and till 3 days after her symptoms has resolved. Continue Tessalon prn cough. ED visit for SOB recommended. I informed her that we don't do COVID-19 test in our clinic. Since she is [employer], she should be able to get tested in the ED. She will call ED regarding getting tested again. F/U as needed.  Time spent during visit with patient: 20 minutes

## 2018-12-15 ENCOUNTER — Ambulatory Visit
Admission: RE | Admit: 2018-12-15 | Discharge: 2018-12-15 | Disposition: A | Discharge status: Home / Self Care | Payer: 59 | Source: Ambulatory Visit | Attending: Family Medicine | Admitting: Family Medicine

## 2018-12-15 DIAGNOSIS — Z20822 Contact with and (suspected) exposure to covid-19: Secondary | ICD-10-CM

## 2018-12-15 DIAGNOSIS — R059 Cough, unspecified: Secondary | ICD-10-CM

## 2018-12-15 DIAGNOSIS — R05 Cough: Secondary | ICD-10-CM

## 2018-12-15 DIAGNOSIS — Z20828 Contact with and (suspected) exposure to other viral communicable diseases: Secondary | ICD-10-CM

## 2018-12-16 ENCOUNTER — Telehealth: Payer: Self-pay | Admitting: *Deleted

## 2018-12-16 ENCOUNTER — Encounter: Payer: Self-pay | Admitting: Family Medicine

## 2018-12-16 NOTE — Telephone Encounter (Signed)
Patient calls back. She needs the letter to show the dates that she was out of work.  These are 11/24/18 - 12/19/18. Will forward to Dr. Tylene Fantasia, CMA

## 2018-12-16 NOTE — Telephone Encounter (Signed)
Please let the patient know that I can not specify the exact return date since it is dependent on if she is symptom free for 72 hours. I have edited her letter. Please send subsequent request to her PCP.

## 2018-12-16 NOTE — Telephone Encounter (Signed)
Pt informed.  She will call back if there is an issue with Employer.  Jone Baseman, CMA

## 2018-12-31 ENCOUNTER — Encounter (HOSPITAL_BASED_OUTPATIENT_CLINIC_OR_DEPARTMENT_OTHER): Payer: 59

## 2019-01-09 ENCOUNTER — Ambulatory Visit (HOSPITAL_BASED_OUTPATIENT_CLINIC_OR_DEPARTMENT_OTHER): Payer: 59 | Attending: Family Medicine

## 2019-04-10 DIAGNOSIS — Z20828 Contact with and (suspected) exposure to other viral communicable diseases: Secondary | ICD-10-CM | POA: Diagnosis not present

## 2019-05-13 ENCOUNTER — Other Ambulatory Visit: Payer: Self-pay | Admitting: Family Medicine

## 2019-05-13 MED FILL — ATORVASTATIN 40 MG TABLET: 40 | 30 days supply | Qty: 30 | Fill #1

## 2019-05-13 MED FILL — LISINOPRIL 10 MG TABS: 10 | 30 days supply | Qty: 30 | Fill #1

## 2019-05-14 MED FILL — GABAPENTIN 300 MG CAPSULE: 300 | 30 days supply | Qty: 90 | Fill #0

## 2019-05-29 DIAGNOSIS — Z23 Encounter for immunization: Secondary | ICD-10-CM | POA: Diagnosis not present

## 2019-06-01 ENCOUNTER — Encounter: Payer: Self-pay | Admitting: Family Medicine

## 2019-06-01 ENCOUNTER — Ambulatory Visit (INDEPENDENT_AMBULATORY_CARE_PROVIDER_SITE_OTHER): Payer: 59 | Admitting: Family Medicine

## 2019-06-01 ENCOUNTER — Other Ambulatory Visit: Payer: Self-pay

## 2019-06-01 ENCOUNTER — Ambulatory Visit
Admission: RE | Admit: 2019-06-01 | Discharge: 2019-06-01 | Disposition: A | Discharge status: Home / Self Care | Payer: 59 | Source: Ambulatory Visit | Attending: Family Medicine | Admitting: Family Medicine

## 2019-06-01 VITALS — BP 144/76 | HR 84 | Ht 67.0 in | Wt 294.2 lb

## 2019-06-01 DIAGNOSIS — G8929 Other chronic pain: Secondary | ICD-10-CM | POA: Diagnosis not present

## 2019-06-01 DIAGNOSIS — M545 Low back pain, unspecified: Secondary | ICD-10-CM

## 2019-06-01 DIAGNOSIS — M25511 Pain in right shoulder: Secondary | ICD-10-CM

## 2019-06-01 DIAGNOSIS — M19011 Primary osteoarthritis, right shoulder: Secondary | ICD-10-CM | POA: Diagnosis not present

## 2019-06-01 DIAGNOSIS — R0602 Shortness of breath: Secondary | ICD-10-CM | POA: Diagnosis not present

## 2019-06-01 HISTORY — DX: Other chronic pain: G89.29

## 2019-06-01 HISTORY — DX: Shortness of breath: R06.02

## 2019-06-01 HISTORY — DX: Pain in right shoulder: M25.511

## 2019-06-01 MED ORDER — ONDANSETRON HCL 4 MG PO TABS: 4.0000 mg | ORAL_TABLET | Freq: Three times a day (TID) | ORAL | 0 refills | Status: DC | PRN

## 2019-06-01 MED ORDER — TRAMADOL HCL 50 MG PO TABS: 50.0000 mg | ORAL_TABLET | Freq: Three times a day (TID) | ORAL | 0 refills | Status: AC | PRN

## 2019-06-01 MED FILL — GABAPENTIN 300 MG CAPSULE: 300 | 30 days supply | Qty: 90 | Fill #0

## 2019-06-01 MED FILL — ONDANSETRON HCL 4 MG TABLET: 4 | 6 days supply | Qty: 20 | Fill #0

## 2019-06-01 MED FILL — traMADol HCL 50 MG TABS: 50 | 7 days supply | Qty: 21 | Fill #0

## 2019-06-01 NOTE — Progress Notes (Signed)
Subjective:  [name] is a 58 y.o. female who presents to the [facility] today with a chief complaint of shoulder and back pain.   HPI:  Acute right shoulder pain Patient reports 2 weeks of right shoulder pain, with significant pain over last night.  Patient denies any trauma to the shoulder.  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  Patient has been taking Tylenol for the pain, 1000 milligrams every 6 hours.  She is also been applying heat.  This helped some.  Patient says that she has some weakness in her right hand with numbness.  She has had bilateral carpal tunnel surgery, but this feels different.  Patient reports allergies to NSAIDs including improvement Tylenol.  Says that she took ibuprofen once and that caused her to have a skin rash and shortness of breath and she had to be treated with an EpiPen.  Since then she has avoided NSAIDs.  Chronic lower back pain Patient reports worsening chronic lower back pain.  Patient has back back pain for several years.  Reports L3-L4 and L4-L5 disc surgery in the past.  She reports worsening pain over the past 3 to 4 months.  Denies any recent trauma.  Denies any bowel or bladder incontinence.  Denies any weakness or numbness of the lower extremities.  Patient does not have documented history of cancer. 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  Pain is worse with extension of back.  Shortness of breath status post CAP Patient was diagnosed with CAP in 11/2018.  She was treated with azithromycin.  She reports that she is just had baseline shortness of breath since this is occurred.  Patient denies any worsening cough or congestion, fevers or chills.  Patient endorses bilateral lower extremity swelling, this is worsened.  ROS: Per HPI  PMH: Smoking history reviewed.   Objective:  Physical Exam: BP (!) 144/76   Pulse 84   Ht 5\' 7"  (1.702 m)   Wt 294 lb 4 oz (133.5 kg)   SpO2 94%    BMI 46.09 kg/m   Gen: NAD, resting comfortably CV: RRR with no murmurs appreciated Pulm: NWOB, CTAB with no crackles, wheezes, or rhonchi GI: Normal bowel sounds present. Soft, Nontender, Nondistended. MSK: Right shoulder is tender to palpation on the posterior aspect, no gross deformities, patient is neurovascularly intact along the right arm with 5 of 5 strength and sensation intact.  Patient has limited abduction and beyond 90 degrees with both active and passive range of motion, has limited external rotation due to pain, normal internal rotation, normal pronation and supination Lower back mildly tender palpation along the right lower aspect, 5 of 5 strength in lower extremities, normal sensation lower extremities Negative Spurling test bilaterally Skin: warm, dry Neuro: grossly normal, moves all extremities Psych: Normal affect and thought content  No results found for this or any previous visit (from the past 72 hour(s)).   Assessment/Plan:  Acute pain of right shoulder Acute right shoulder pain, with limited abduction.  Possible rotator cuff injury, possibly from overuse at patient's place of work.  Unable to do NSAIDs due to patient's allergy.  Recommend getting shoulder x-ray, short course of tramadol.  Consider physical therapy if no improvement in the coming weeks.  Continue heat and Tylenol as well.  BACK PAIN, CHRONIC Worsening back pain.  Patient with history of lumbar spine surgery.  Will get lumbar  spine x-ray to rule out any bony abnormalities.  Patient may get improvement from short course of tramadol.  Recommend continued Tylenol PRN.  Would likely do well with have physical therapy if no improvement after course of tramadol..  Shortness of breath Shortness of breath after having course of pneumonia.  Given age and chronic comorbidities including obesity, likely related to deconditioning.  Normal lung exam and satting well on room air.  Continue to monitor, suspect it will  improve with time.   Lab Orders  No laboratory test(s) ordered today    Meds ordered this encounter  Medications  . traMADol (ULTRAM) 50 MG tablet    Sig: Take 1 tablet (50 mg total) by mouth every 8 (eight) hours as needed for up to 7 days.    Dispense:  21 tablet    Refill:  0  . ondansetron (ZOFRAN) 4 MG tablet    Sig: Take 1 tablet (4 mg total) by mouth every 8 (eight) hours as needed for nausea or vomiting.    Dispense:  20 tablet    Refill:  0      Thomes Dinning, MD, MS FAMILY MEDICINE RESIDENT - PGY3 06/01/2019 4:29 PM

## 2019-06-01 NOTE — Assessment & Plan Note (Signed)
Acute right shoulder pain, with limited abduction.  Possible rotator cuff injury, possibly from overuse at patient's place of work.  Unable to do NSAIDs due to patient's allergy.  Recommend getting shoulder x-ray, short course of tramadol.  Consider physical therapy if no improvement in the coming weeks.  Continue heat and Tylenol as well.

## 2019-06-01 NOTE — Assessment & Plan Note (Signed)
Shortness of breath after having course of pneumonia.  Given age and chronic comorbidities including obesity, likely related to deconditioning.  Normal lung exam and satting well on room air.  Continue to monitor, suspect it will improve with time.

## 2019-06-01 NOTE — Patient Instructions (Signed)
Shoulder Pain Many things can cause shoulder pain, including:  An injury.  Moving the shoulder in the same way again and again (overuse).  Joint pain (arthritis). Pain can come from:  Swelling and irritation (inflammation) of any part of the shoulder.  An injury to the shoulder joint.  An injury to: ? Tissues that connect muscle to bone (tendons). ? Tissues that connect bones to each other (ligaments). ? Bones. Follow these instructions at home: Watch for changes in your symptoms. Let your doctor know about them. Follow these instructions to help with your pain. If you have a sling:  Wear the sling as told by your doctor. Remove it only as told by your doctor.  Loosen the sling if your fingers: ? Tingle. ? Become numb. ? Turn cold and blue.  Keep the sling clean.  If the sling is not waterproof: ? Do not let it get wet. ? Take the sling off when you shower or bathe. Managing pain, stiffness, and swelling   If told, put ice on the painful area: ? Put ice in a plastic bag. ? Place a towel between your skin and the bag. ? Leave the ice on for 20 minutes, 2-3 times a day. Stop putting ice on if it does not help with the pain.  Squeeze a soft ball or a foam pad as much as possible. This prevents swelling in the shoulder. It also helps to strengthen the arm. General instructions  Take over-the-counter and prescription medicines only as told by your doctor.  Keep all follow-up visits as told by your doctor. This is important. Contact a doctor if:  Your pain gets worse.  Medicine does not help your pain.  You have new pain in your arm, hand, or fingers. Get help right away if:  Your arm, hand, or fingers: ? Tingle. ? Are numb. ? Are swollen. ? Are painful. ? Turn white or blue. Summary  Shoulder pain can be caused by many things. These include injury, moving the shoulder in the same away again and again, and joint pain.  Watch for changes in your symptoms.  Let your doctor know about them.  This condition may be treated with a sling, ice, and pain medicine.  Contact your doctor if the pain gets worse or you have new pain. Get help right away if your arm, hand, or fingers tingle or get numb, swollen, or painful.  Keep all follow-up visits as told by your doctor. This is important. This information is not intended to replace advice given to you by your health care provider. Make sure you discuss any questions you have with your health care provider. Document Released: 01/23/2008 Document Revised: 02/18/2018 Document Reviewed: 02/18/2018 Elsevier Patient Education  2020 Elsevier [employer].  

## 2019-06-01 NOTE — Assessment & Plan Note (Signed)
Worsening back pain.  Patient with history of lumbar spine surgery.  Will get lumbar spine x-ray to rule out any bony abnormalities.  Patient may get improvement from short course of tramadol.  Recommend continued Tylenol PRN.  Would likely do well with have physical therapy if no improvement after course of tramadol.Marland Kitchen

## 2019-06-02 ENCOUNTER — Encounter: Payer: Self-pay | Admitting: Family Medicine

## 2019-06-02 ENCOUNTER — Telehealth: Payer: Self-pay | Admitting: Family Medicine

## 2019-06-02 NOTE — Telephone Encounter (Signed)
Attempted to reach pt. No answer. LVM of note. Sherwin Hollingshed, CMA  

## 2019-06-02 NOTE — Telephone Encounter (Signed)
Patient shoulder xray showed mild arthritis changes, but signs of fracture. Patient lumbar spine xray showed mild disc disease in L4-L5 and L5-S1. Nothing there were no acute changes. Patient to follow up in clinic if no improvement. Will have staff call to inform patient.

## 2019-06-07 IMAGING — DX PORTABLE CHEST - 1 VIEW
1 series · 1 of 1 positions shown · non-contrast
Comparison: 12/05/2016

CLINICAL DATA: Cough and fever over the last week.

EXAM:
PORTABLE CHEST 1 VIEW

[chest ap]
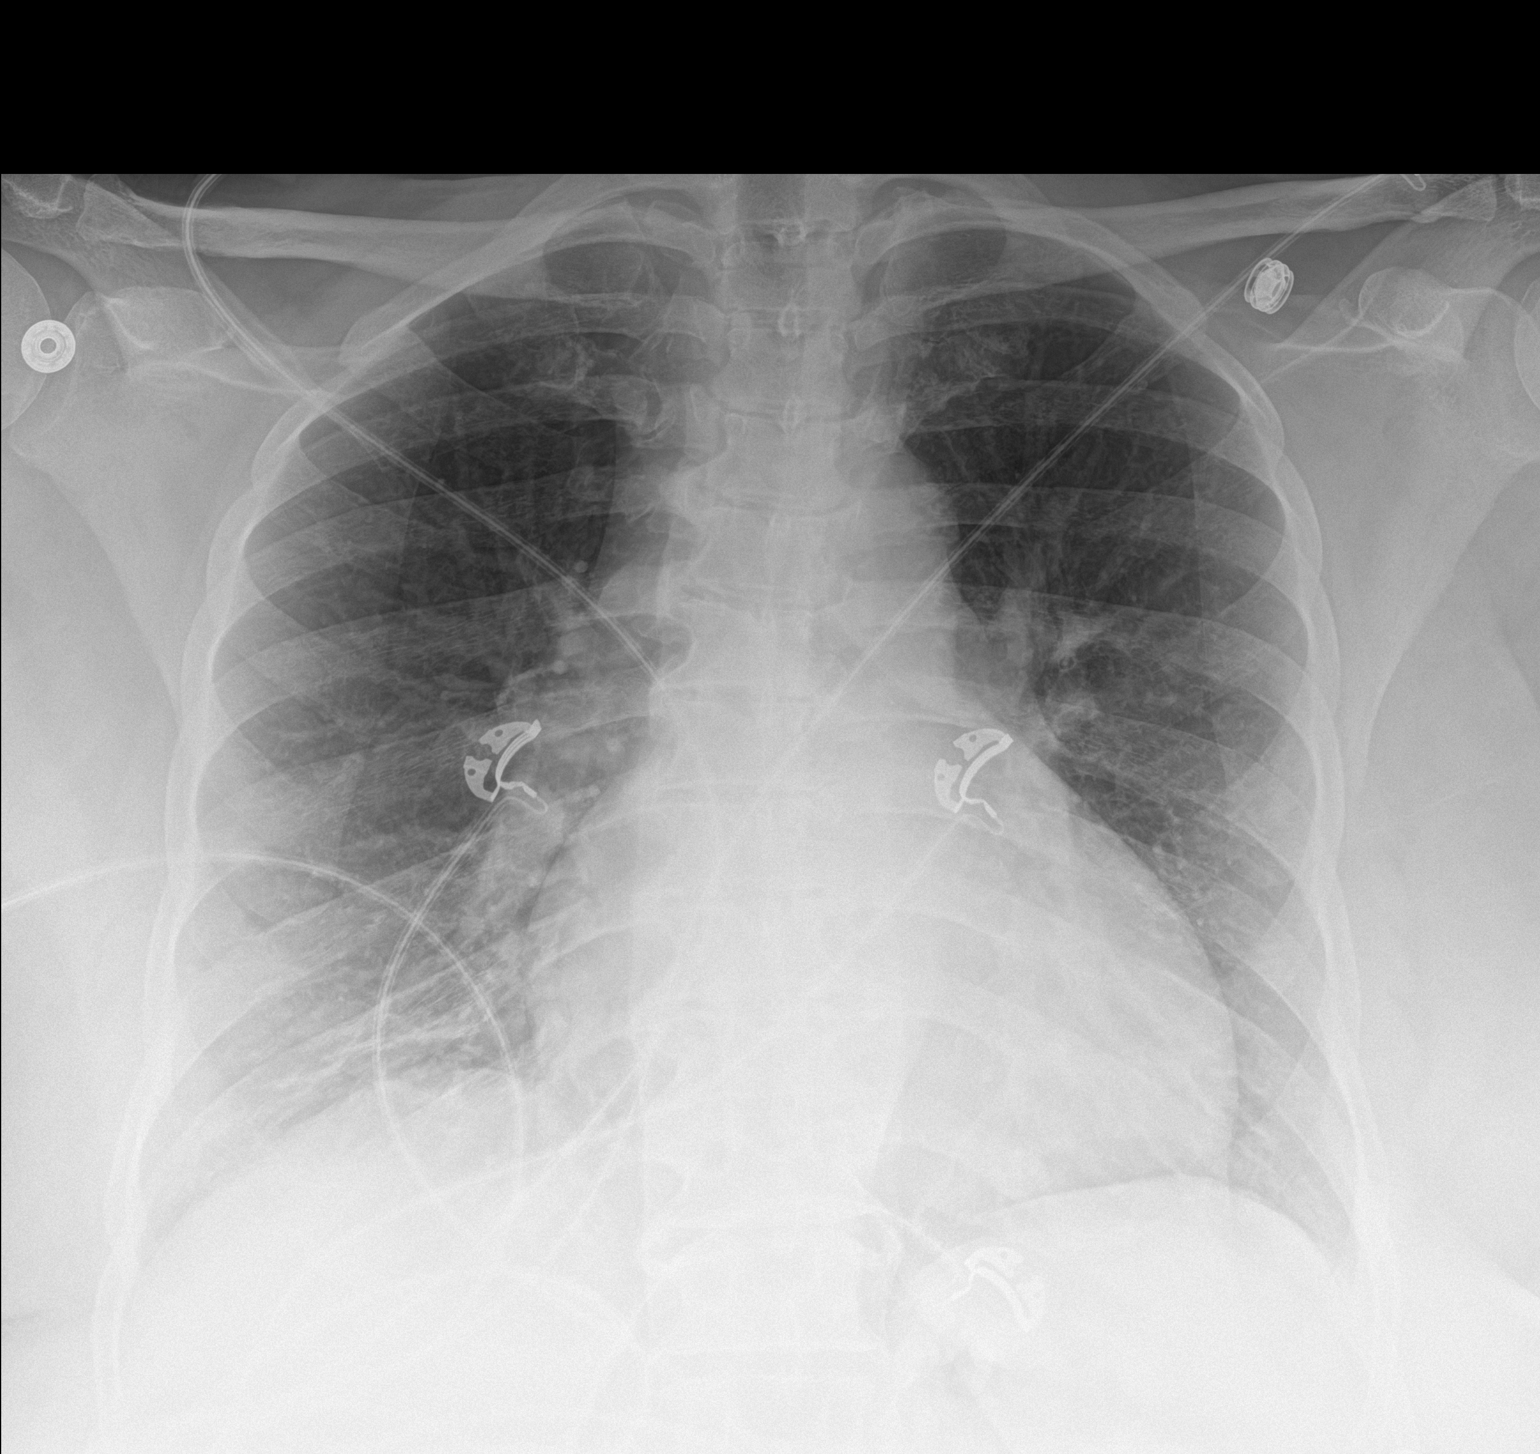

[1 of 1 positions shown; findings below may reference images not displayed]

FINDINGS: Mild cardiomegaly. Mediastinal shadows otherwise normal. Chronic
appearing scarring at the right lung base. These markings may be
slightly more prominent which may represent progressive scarring or
some active atelectasis or mild patchy basilar infiltrate. No sign
of lobar consolidation, collapse or effusion.
IMPRESSION: Chronic markings at the right lung base, which appear more prominent
when compared to the study November 2016. This could represent
chronic scarring which is progressive or there could be some active
atelectasis or patchy infiltrate at the right lung base.

## 2019-08-17 DIAGNOSIS — Z13220 Encounter for screening for lipoid disorders: Secondary | ICD-10-CM | POA: Diagnosis not present

## 2019-08-17 DIAGNOSIS — Z131 Encounter for screening for diabetes mellitus: Secondary | ICD-10-CM | POA: Diagnosis not present

## 2019-08-17 DIAGNOSIS — R5383 Other fatigue: Secondary | ICD-10-CM | POA: Diagnosis not present

## 2019-08-17 DIAGNOSIS — E559 Vitamin D deficiency, unspecified: Secondary | ICD-10-CM | POA: Diagnosis not present

## 2019-08-17 DIAGNOSIS — I10 Essential (primary) hypertension: Secondary | ICD-10-CM | POA: Diagnosis not present

## 2019-08-19 ENCOUNTER — Other Ambulatory Visit: Payer: Self-pay

## 2019-08-19 ENCOUNTER — Emergency Department (HOSPITAL_COMMUNITY)
Admission: EM | Admit: 2019-08-19 | Discharge: 2019-08-19 | Disposition: A | Discharge status: Home / Self Care | Payer: Worker's Compensation | Attending: Emergency Medicine | Admitting: Emergency Medicine

## 2019-08-19 ENCOUNTER — Encounter (HOSPITAL_COMMUNITY): Payer: Self-pay | Admitting: Emergency Medicine

## 2019-08-19 DIAGNOSIS — I10 Essential (primary) hypertension: Secondary | ICD-10-CM | POA: Diagnosis not present

## 2019-08-19 DIAGNOSIS — M545 Low back pain, unspecified: Secondary | ICD-10-CM

## 2019-08-19 DIAGNOSIS — Z79899 Other long term (current) drug therapy: Secondary | ICD-10-CM | POA: Diagnosis not present

## 2019-08-19 DIAGNOSIS — M5489 Other dorsalgia: Secondary | ICD-10-CM | POA: Diagnosis not present

## 2019-08-19 MED ORDER — ONDANSETRON 4 MG PO TBDP
8.0000 mg | ORAL_TABLET | Freq: Once | ORAL | Status: AC
  Administered 2019-08-19: 8 mg via ORAL
  Filled 2019-08-19: qty 2

## 2019-08-19 MED ORDER — METHOCARBAMOL 500 MG PO TABS
1000.0000 mg | ORAL_TABLET | Freq: Once | ORAL | Status: AC
  Administered 2019-08-19: 1000 mg via ORAL
  Filled 2019-08-19: qty 2

## 2019-08-19 MED ORDER — HYDROCODONE-ACETAMINOPHEN 5-325 MG PO TABS: 1.0000 | ORAL_TABLET | Freq: Four times a day (QID) | ORAL | 0 refills | Status: DC | PRN

## 2019-08-19 MED ORDER — LIDOCAINE 5 % EX PTCH: 1.0000 | MEDICATED_PATCH | CUTANEOUS | 0 refills | Status: DC

## 2019-08-19 MED ORDER — METHOCARBAMOL 500 MG PO TABS: 500.0000 mg | ORAL_TABLET | Freq: Two times a day (BID) | ORAL | 0 refills | Status: DC

## 2019-08-19 MED ORDER — OXYCODONE-ACETAMINOPHEN 5-325 MG PO TABS
2.0000 | ORAL_TABLET | Freq: Once | ORAL | Status: AC
  Administered 2019-08-19: 2 via ORAL
  Filled 2019-08-19: qty 2

## 2019-08-19 MED ORDER — LIDOCAINE 5 % EX PTCH
2.0000 | MEDICATED_PATCH | Freq: Once | CUTANEOUS | Status: DC
  Administered 2019-08-19: 2 via TRANSDERMAL
  Filled 2019-08-19: qty 2

## 2019-08-19 MED ORDER — ONDANSETRON 8 MG PO TBDP: 8.0000 mg | ORAL_TABLET | Freq: Three times a day (TID) | ORAL | 0 refills | Status: DC | PRN

## 2019-08-19 MED FILL — HYDROCODON-APAP 5-325: 5-325 | 2 days supply | Qty: 10 | Fill #0

## 2019-08-19 MED FILL — METHOCARBAMOL 500 MG TABS: 500 | 10 days supply | Qty: 20 | Fill #0

## 2019-08-19 MED FILL — LIDOCAINE PATCH 5%: 5 | 30 days supply | Qty: 30 | Fill #0

## 2019-08-19 MED FILL — ONDANSETRON ODT 8 MG TABLET: 8 | 7 days supply | Qty: 20 | Fill #0

## 2019-08-19 NOTE — ED Provider Notes (Signed)
[name]   CSN: [id number] Arrival date & time: 08/19/19  1002     History Chief Complaint  Patient presents with  . Back Pain    [name] is a 58 y.o. female.  HPI      [name] is a 58 y.o. female, with a history of HTN and morbid obesity, presenting to the ED with right lower back pain beginning yesterday.  Patient is a hospice healthcare aide, was bathing a morbidly obese patient, she suddenly grabbed the patient to prevent her from falling off the bed, jerking and twisting her back.  She had worsening right lower back pain since the incident, described as a tightness and spasming, now severe, radiating into the right buttock.   She took Tylenol applied BenGay without relief.  Denies weakness, numbness, falls, abdominal pain, changes in bowel or bladder function, saddle anesthesias, or any other complaints.    Past Medical History:  Diagnosis Date  . Arthritis   . Fatty liver   . GERD (gastroesophageal reflux disease)   . Hypertension   . PONV (postoperative nausea and vomiting)    with pain med  . Sleep apnea    previously no longer neg sleep study  . Thoracic nerve root impingement 08-05/2010   Severe right flank pain. Resolved with thoracic epidural steroid injection.   Marland Kitchen Umbilical hernia   . UTI (lower urinary tract infection)    recent- unable to finish med only took 2 days    Patient Active Problem List   Diagnosis Date Noted  . Acute pain of right shoulder 06/01/2019  . Chronic bilateral low back pain without sciatica 06/01/2019  . Shortness of breath 06/01/2019  . Cough 12/02/2018  . Otitis, externa, infective 06/04/2017  . Nasal congestion 06/04/2017  . Intertrigo 12/25/2016  . Ventral hernia 10/31/2015  . Healthcare maintenance 10/31/2015  . Flashing lights 04/07/2015  . Blurry vision, left eye 04/07/2015  . Vertigo 02/24/2014  . Macromastia 12/19/2013  . Arthritis of knee  10/06/2013  . Leg cramping 01/29/2013  . Seasonal allergies 01/04/2012  . OBESITY, MORBID 09/26/2010  . GERD 10/18/2009  . BACK PAIN, CHRONIC 05/14/2008  . SLEEP APNEA, OBSTRUCTIVE, SEVERE 11/10/2006  . HYPERTENSION, BENIGN SYSTEMIC 10/17/2006  . OSTEOARTHRITIS, LOWER LEG 10/17/2006    Past Surgical History:  Procedure Laterality Date  . ABDOMINAL HYSTERECTOMY  2004   "partial"  . BACK SURGERY  04,06   x2  . BILATERAL OOPHORECTOMY  2010   2/2 malignancy  . CARPAL TUNNEL RELEASE Bilateral ?1999  . CESAREAN SECTION  1989  . LAMINECTOMY AND MICRODISCECTOMY LUMBAR SPINE  2004; 2006  . TOTAL KNEE ARTHROPLASTY Left 10/06/2013   Procedure: TOTAL KNEE ARTHROPLASTY;  Surgeon: Renette Butters, MD;  Location: [facility];  Service: Orthopedics;  Laterality: Left;  . TOTAL KNEE ARTHROPLASTY Right 11/09/2013   Procedure: RIGHT TOTAL KNEE ARTHROPLASTY;  Surgeon: Renette Butters, MD;  Location: [facility];  Service: Orthopedics;  Laterality: Right;     OB History   No obstetric history on file.     No family history on file.  Social History   Tobacco Use  . Smoking status: Never Smoker  . Smokeless tobacco: Never Used  Substance Use Topics  . Alcohol use: No  . Drug use: No    Home Medications Prior to Admission medications   Medication Sig Start Date End Date Taking? Authorizing Provider  atorvastatin (LIPITOR) 40 MG tablet Take 1  tablet (40 mg total) by mouth daily. 10/21/18   Garnette Gunnerhompson, Aaron B, MD  benzonatate (TESSALON) 100 MG capsule Take 1 capsule (100 mg total) by mouth 2 (two) times daily as needed for cough. 12/02/18   Carney Livinghambliss, Marshall L, MD  cetirizine (ZYRTEC) 10 MG tablet Take 1 tablet (10 mg total) by mouth daily. 10/21/18   Garnette Gunnerhompson, Aaron B, MD  fluticasone (FLONASE) 50 MCG/ACT nasal spray Place 2 sprays into both nostrils daily. 10/21/18   Garnette Gunnerhompson, Aaron B, MD  gabapentin (NEURONTIN) 300 MG capsule TAKE 1 CAPSULE BY MOUTH 3 TIMES DAILY. 05/13/19   Garnette Gunnerhompson, Aaron B, MD    HYDROcodone-acetaminophen (NORCO/VICODIN) 5-325 MG tablet Take 1-2 tablets by mouth every 6 (six) hours as needed for severe pain. 08/19/19   Bryon Parker C, PA-C  lidocaine (LIDODERM) 5 % Place 1 patch onto the skin daily. Remove & Discard patch within 12 hours or as directed by MD 08/19/19   Leeah Politano C, PA-C  lisinopril (PRINIVIL,ZESTRIL) 10 MG tablet Take 10 mg by mouth daily.  10/21/18   [provider]  metFORMIN (GLUCOPHAGE) 500 MG tablet Take 1 tablet (500 mg total) by mouth 2 (two) times daily with a meal. 10/21/18   Garnette Gunnerhompson, Aaron B, MD  methocarbamol (ROBAXIN) 500 MG tablet Take 1 tablet (500 mg total) by mouth 2 (two) times daily. 08/19/19   Assia Meanor C, PA-C  nystatin (MYCOSTATIN/NYSTOP) powder Apply topically 4 (four) times daily. Patient taking differently: Apply topically 4 (four) times daily. AS DIRECTED- TO AFFECTED AREAS 10/21/18   Garnette Gunnerhompson, Aaron B, MD  Olopatadine HCl 0.2 % SOLN Apply 1 drop to eye daily. Patient not taking: Reported on 11/24/2018 10/21/18   Garnette Gunnerhompson, Aaron B, MD  ondansetron (ZOFRAN ODT) 8 MG disintegrating tablet Take 1 tablet (8 mg total) by mouth every 8 (eight) hours as needed for nausea or vomiting. 08/19/19   Mykaela Arena C, PA-C  ondansetron (ZOFRAN) 4 MG tablet Take 1 tablet (4 mg total) by mouth every 8 (eight) hours as needed for nausea or vomiting. 06/01/19   Garnette Gunnerhompson, Aaron B, MD  polyethylene glycol powder (GLYCOLAX/MIRALAX) powder Take 17 g by mouth 2 (two) times daily as needed. Patient taking differently: Take 17 g by mouth 2 (two) times daily as needed (for constipation).  10/21/18   Garnette Gunnerhompson, Aaron B, MD  sucralfate (CARAFATE) 1 G tablet Take 1 tablet (1 g total) by mouth 4 (four) times daily. 12/26/10 11/02/11  Jonnie KindSaunders, Weston W, MD    Allergies    Allopurinol, Lubiprostone, Naproxen, Other, and Oxycodone  Review of Systems   Review of Systems  Constitutional: Negative for chills, diaphoresis and fever.  Respiratory: Negative for shortness  of breath.   Cardiovascular: Negative for chest pain.  Gastrointestinal: Negative for abdominal pain, nausea and vomiting.  Musculoskeletal: Positive for back pain.  Neurological: Negative for syncope, weakness and numbness.  All other systems reviewed and are negative.   Physical Exam Updated Vital Signs BP (!) 160/82 (BP Location: Right Arm)   Pulse 78   Temp 98.3 F (36.8 C) (Oral)   Resp 20   SpO2 96%   Physical Exam Vitals and nursing [name] reviewed.  Constitutional:      General: She is not in acute distress.    Appearance: She is well-developed. She is obese. She is not diaphoretic.     Comments: Patient lying on her left side due to discomfort in her right lower back.  HENT:     Head: Normocephalic and  atraumatic.     Mouth/Throat:     Mouth: Mucous membranes are moist.     Pharynx: Oropharynx is clear.  Eyes:     Conjunctiva/sclera: Conjunctivae normal.  Cardiovascular:     Rate and Rhythm: Normal rate and regular rhythm.     Pulses: Normal pulses.          Radial pulses are 2+ on the right side and 2+ on the left side.       Posterior tibial pulses are 2+ on the right side and 2+ on the left side.     Comments: Tactile temperature in the extremities appropriate and equal bilaterally. Pulmonary:     Effort: Pulmonary effort is normal. No respiratory distress.  Abdominal:     Palpations: Abdomen is soft.     Tenderness: There is no abdominal tenderness. There is no guarding.  Musculoskeletal:     Cervical back: Neck supple.     Lumbar back: Tenderness present.       Back:     Right lower leg: No edema.     Left lower leg: No edema.     Comments: Normal motor function intact in all extremities. No midline spinal tenderness.   Lymphadenopathy:     Cervical: No cervical adenopathy.  Skin:    General: Skin is warm and dry.  Neurological:     Mental Status: She is alert.     Comments: Sensation grossly intact to light touch in the lower extremities  bilaterally. No saddle anesthesias. Strength 5/5 in the bilateral lower extremities. Coordination intact. Patient ambulated following pain management with a slow, careful gait without need for assistance.  Psychiatric:        Mood and Affect: Mood and affect normal.        Speech: Speech normal.        Behavior: Behavior normal.     ED Results / Procedures / Treatments   Labs (all labs ordered are listed, but only abnormal results are displayed) Labs Reviewed - No data to display  EKG None  Radiology No results found.  Procedures Procedures (including critical care time)  Medications Ordered in ED Medications  lidocaine (LIDODERM) 5 % 2 patch (2 patches Transdermal Patch Applied 08/19/19 1332)  oxyCODONE-acetaminophen (PERCOCET/ROXICET) 5-325 MG per tablet 2 tablet (2 tablets Oral Given 08/19/19 1330)  methocarbamol (ROBAXIN) tablet 1,000 mg (1,000 mg Oral Given 08/19/19 1330)  ondansetron (ZOFRAN-ODT) disintegrating tablet 8 mg (8 mg Oral Given 08/19/19 1330)    ED Course  I have reviewed the triage vital signs and the nursing notes.  Pertinent labs & imaging results that were available during my care of the patient were reviewed by me and considered in my medical decision making (see chart for details).  Clinical Course as of Aug 19 1623  Wed Aug 19, 2019  1421 Patient voices significant improvement in her pain.   [SJ]    Clinical Course User Index [SJ] Devinn Voshell, Hillard Danker, PA-C   MDM Rules/Calculators/A&P                       Patient presents with onset of right lower back pain following an incident with pushing and twisting.  No focal neuro deficits.  No red flag symptoms.  Presents patient at the current time suggestive of pain of muscular origin.  The patient was given instructions for home care as well as return precautions. Patient voices understanding of these instructions, accepts the plan, and is comfortable  with discharge.   Final Clinical Impression(s) /  ED Diagnoses Final diagnoses:  Acute right-sided low back pain without sciatica    Rx / DC Orders ED Discharge Orders         Ordered    methocarbamol (ROBAXIN) 500 MG tablet  2 times daily     08/19/19 1436    lidocaine (LIDODERM) 5 %  Every 24 hours     08/19/19 1436    HYDROcodone-acetaminophen (NORCO/VICODIN) 5-325 MG tablet  Every 6 hours PRN     08/19/19 1436    ondansetron (ZOFRAN ODT) 8 MG disintegrating tablet  Every 8 hours PRN     08/19/19 1440           Anselm Pancoast, PA-C 08/19/19 1624    Charlynne Pander, MD 08/20/19 [phone]

## 2019-08-19 NOTE — Discharge Instructions (Addendum)
Expect your soreness to increase over the next 2-3 days. Take it easy, but do not lay around too much as this may make any stiffness worse.  Acetaminophen: May take acetaminophen (generic for Tylenol), as needed, for pain. Your daily total maximum amount of acetaminophen from all sources should be limited to 4000mg /day for persons without liver problems, or 2000mg /day for those with liver problems. Vicodin: May take Vicodin (hydrocodone-acetaminophen) as needed for severe pain.   Do not drive or perform other dangerous activities while taking this medication as it can cause drowsiness as well as changes in reaction time and judgement.   Please note that each pill of Vicodin contains 325 mg of acetaminophen (generic for Tylenol) and the above dosage limits apply. Methocarbamol: Methocarbamol (generic for Robaxin) is a muscle relaxer and can help relieve stiff muscles or muscle spasms.  Do not drive or perform other dangerous activities while taking this medication as it can cause drowsiness as well as changes in reaction time and judgement. Lidocaine patches: These are available via either prescription or over-the-counter. The over-the-counter option may be more economical one and are likely just as effective. There are multiple over-the-counter brands, such as Salonpas. Nausea/vomiting: Use the ondansetron (generic for Zofran) for nausea or vomiting.  This medication may not prevent all vomiting or nausea, but can help facilitate better hydration. Things that can help with nausea/vomiting also include peppermint/menthol candies, vitamin B12, and ginger. Exercises: Be sure to perform the attached exercises starting with three times a week and working up to performing them daily. This is an essential part of preventing long term problems.  Follow up: Follow up with a primary care provider for any future management of these complaints. Be sure to follow up within 7-10 days. Return: Return to the ED should  symptoms worsen.  For prescription assistance, may try using prescription discount sites or apps, such as goodrx.com

## 2019-08-19 NOTE — ED Triage Notes (Signed)
Patient c/o lower back pain after bathing a patient yesterday. Was sent by work. Taken tylenol arthritis w/o relief. Denies any issues with bowel or bladder. Hx of back surgery. Denies any numbness or tingling.

## 2019-08-24 ENCOUNTER — Other Ambulatory Visit: Payer: Self-pay | Admitting: Orthopedic Surgery

## 2019-08-24 ENCOUNTER — Other Ambulatory Visit: Payer: Self-pay | Admitting: Family Medicine

## 2019-08-24 DIAGNOSIS — M545 Low back pain, unspecified: Secondary | ICD-10-CM

## 2019-08-24 MED FILL — metFORMIN HCL 500 MG TABS: 500 | 30 days supply | Qty: 60 | Fill #1

## 2019-08-24 MED FILL — ATORVASTATIN 40 MG TABLET: 40 | 30 days supply | Qty: 30 | Fill #1

## 2019-08-24 MED FILL — METHYLPREDNISOLONE 4 MG TAB: 4 | 6 days supply | Qty: 21 | Fill #0

## 2019-08-24 MED FILL — LISINOPRIL 10 MG TABS: 10 | 30 days supply | Qty: 30 | Fill #1

## 2019-08-24 MED FILL — diazePAM 5 MG TABS: 5 | 1 days supply | Qty: 2 | Fill #0

## 2019-08-24 MED FILL — GABAPENTIN 300 MG CAPSULE: 300 | 30 days supply | Qty: 90 | Fill #0

## 2019-08-28 ENCOUNTER — Other Ambulatory Visit: Payer: Self-pay

## 2019-08-28 ENCOUNTER — Ambulatory Visit
Admission: RE | Admit: 2019-08-28 | Discharge: 2019-08-28 | Disposition: A | Discharge status: Home / Self Care | Payer: 59 | Source: Ambulatory Visit | Attending: Orthopedic Surgery | Admitting: Orthopedic Surgery

## 2019-08-28 DIAGNOSIS — M545 Low back pain, unspecified: Secondary | ICD-10-CM

## 2019-08-28 DIAGNOSIS — M48061 Spinal stenosis, lumbar region without neurogenic claudication: Secondary | ICD-10-CM | POA: Diagnosis not present

## 2019-09-18 ENCOUNTER — Other Ambulatory Visit: Payer: Self-pay | Admitting: Family Medicine

## 2019-09-18 MED FILL — ACETAMINOPHEN/COD #3 TABLET: 300-30 | 7 days supply | Qty: 60 | Fill #0

## 2019-09-18 MED FILL — METHOCARBAMOL 500 MG TABS: 500 | 20 days supply | Qty: 40 | Fill #0

## 2019-09-18 MED FILL — ONDANSETRON HCL 4 MG TABLET: 4 | 20 days supply | Qty: 60 | Fill #0

## 2019-09-21 ENCOUNTER — Other Ambulatory Visit: Payer: Self-pay | Admitting: Family Medicine

## 2019-09-21 MED FILL — GABAPENTIN 300 MG CAPSULE: 300 | 30 days supply | Qty: 90 | Fill #0

## 2019-09-28 MED FILL — DIAZEPAM 10 MG TABS: 10 | 1 days supply | Qty: 2 | Fill #0

## 2019-10-05 DIAGNOSIS — M6281 Muscle weakness (generalized): Secondary | ICD-10-CM | POA: Diagnosis not present

## 2019-10-05 DIAGNOSIS — R262 Difficulty in walking, not elsewhere classified: Secondary | ICD-10-CM | POA: Diagnosis not present

## 2019-10-05 DIAGNOSIS — M545 Low back pain: Secondary | ICD-10-CM | POA: Diagnosis not present

## 2019-10-06 MED FILL — ACETAMINOPHEN/COD #3 TABLET: 300-30 | 7 days supply | Qty: 60 | Fill #1

## 2019-10-06 MED FILL — GABAPENTIN 300 MG CAPSULE: 300 | 30 days supply | Qty: 90 | Fill #0

## 2019-10-07 DIAGNOSIS — M6281 Muscle weakness (generalized): Secondary | ICD-10-CM | POA: Diagnosis not present

## 2019-10-07 DIAGNOSIS — R262 Difficulty in walking, not elsewhere classified: Secondary | ICD-10-CM | POA: Diagnosis not present

## 2019-10-07 DIAGNOSIS — M545 Low back pain: Secondary | ICD-10-CM | POA: Diagnosis not present

## 2019-10-09 DIAGNOSIS — M545 Low back pain: Secondary | ICD-10-CM | POA: Diagnosis not present

## 2019-10-09 DIAGNOSIS — R262 Difficulty in walking, not elsewhere classified: Secondary | ICD-10-CM | POA: Diagnosis not present

## 2019-10-09 DIAGNOSIS — M6281 Muscle weakness (generalized): Secondary | ICD-10-CM | POA: Diagnosis not present

## 2019-10-12 DIAGNOSIS — M6281 Muscle weakness (generalized): Secondary | ICD-10-CM | POA: Diagnosis not present

## 2019-10-12 DIAGNOSIS — M545 Low back pain: Secondary | ICD-10-CM | POA: Diagnosis not present

## 2019-10-12 DIAGNOSIS — R262 Difficulty in walking, not elsewhere classified: Secondary | ICD-10-CM | POA: Diagnosis not present

## 2019-10-14 DIAGNOSIS — M5416 Radiculopathy, lumbar region: Secondary | ICD-10-CM | POA: Diagnosis not present

## 2019-10-19 DIAGNOSIS — M6281 Muscle weakness (generalized): Secondary | ICD-10-CM | POA: Diagnosis not present

## 2019-10-19 DIAGNOSIS — R262 Difficulty in walking, not elsewhere classified: Secondary | ICD-10-CM | POA: Diagnosis not present

## 2019-10-19 DIAGNOSIS — M545 Low back pain: Secondary | ICD-10-CM | POA: Diagnosis not present

## 2019-10-21 DIAGNOSIS — M545 Low back pain: Secondary | ICD-10-CM | POA: Diagnosis not present

## 2019-10-21 DIAGNOSIS — R262 Difficulty in walking, not elsewhere classified: Secondary | ICD-10-CM | POA: Diagnosis not present

## 2019-10-21 DIAGNOSIS — M6281 Muscle weakness (generalized): Secondary | ICD-10-CM | POA: Diagnosis not present

## 2019-10-23 DIAGNOSIS — M6281 Muscle weakness (generalized): Secondary | ICD-10-CM | POA: Diagnosis not present

## 2019-10-23 DIAGNOSIS — M545 Low back pain: Secondary | ICD-10-CM | POA: Diagnosis not present

## 2019-10-23 DIAGNOSIS — R262 Difficulty in walking, not elsewhere classified: Secondary | ICD-10-CM | POA: Diagnosis not present

## 2019-10-25 ENCOUNTER — Ambulatory Visit: Payer: Self-pay

## 2019-10-25 ENCOUNTER — Ambulatory Visit: Payer: 59 | Attending: Internal Medicine

## 2019-10-25 DIAGNOSIS — Z23 Encounter for immunization: Secondary | ICD-10-CM | POA: Insufficient documentation

## 2019-10-25 NOTE — Progress Notes (Signed)
   Covid-19 Vaccination Clinic  Name:  [name]    MRN: [id number] DOB: 1961-03-29  10/25/2019  [name] was observed post Covid-19 immunization for 15 minutes without incident. She was provided with Vaccine Information Sheet and instruction to access the V-Safe system.   [name] was instructed to call 911 with any severe reactions post vaccine: Marland Kitchen Difficulty breathing  . Swelling of face and throat  . A fast heartbeat  . A bad rash all over body  . Dizziness and weakness   Immunizations Administered    Name Date Dose VIS Date Route   Pfizer COVID-19 Vaccine 10/25/2019 11:26 AM 0.3 mL 07/31/2019 Intramuscular   Manufacturer: [employer], [employer]   Lot: [id number]   NDC: [address]-1

## 2019-10-26 DIAGNOSIS — M545 Low back pain: Secondary | ICD-10-CM | POA: Diagnosis not present

## 2019-10-26 DIAGNOSIS — M6281 Muscle weakness (generalized): Secondary | ICD-10-CM | POA: Diagnosis not present

## 2019-10-26 DIAGNOSIS — R262 Difficulty in walking, not elsewhere classified: Secondary | ICD-10-CM | POA: Diagnosis not present

## 2019-10-28 DIAGNOSIS — M545 Low back pain: Secondary | ICD-10-CM | POA: Diagnosis not present

## 2019-10-28 DIAGNOSIS — R262 Difficulty in walking, not elsewhere classified: Secondary | ICD-10-CM | POA: Diagnosis not present

## 2019-10-28 DIAGNOSIS — M6281 Muscle weakness (generalized): Secondary | ICD-10-CM | POA: Diagnosis not present

## 2019-10-30 DIAGNOSIS — Z6841 Body Mass Index (BMI) 40.0 and over, adult: Secondary | ICD-10-CM | POA: Diagnosis not present

## 2019-10-30 DIAGNOSIS — M4807 Spinal stenosis, lumbosacral region: Secondary | ICD-10-CM | POA: Diagnosis not present

## 2019-10-30 MED FILL — ACETAMINOPHEN/COD #3 TABLET: 300-30 | 8 days supply | Qty: 60 | Fill #0

## 2019-10-30 MED FILL — ONDANSETRON HCL 4 MG TABLET: 4 | 30 days supply | Qty: 9 | Fill #0

## 2019-11-02 DIAGNOSIS — M6281 Muscle weakness (generalized): Secondary | ICD-10-CM | POA: Diagnosis not present

## 2019-11-02 DIAGNOSIS — M545 Low back pain: Secondary | ICD-10-CM | POA: Diagnosis not present

## 2019-11-02 DIAGNOSIS — R262 Difficulty in walking, not elsewhere classified: Secondary | ICD-10-CM | POA: Diagnosis not present

## 2019-11-04 DIAGNOSIS — M545 Low back pain: Secondary | ICD-10-CM | POA: Diagnosis not present

## 2019-11-04 DIAGNOSIS — M6281 Muscle weakness (generalized): Secondary | ICD-10-CM | POA: Diagnosis not present

## 2019-11-04 DIAGNOSIS — R262 Difficulty in walking, not elsewhere classified: Secondary | ICD-10-CM | POA: Diagnosis not present

## 2019-11-24 ENCOUNTER — Ambulatory Visit: Payer: 59 | Attending: Internal Medicine

## 2019-11-24 DIAGNOSIS — Z23 Encounter for immunization: Secondary | ICD-10-CM

## 2019-11-24 NOTE — Progress Notes (Signed)
   Covid-19 Vaccination Clinic  Name:  AMBREEN TUFTE    MRN: [id number] DOB: 11/19/1960  11/24/2019  Ms. Cundy was observed post Covid-19 immunization for 15 minutes without incident. She was provided with Vaccine Information Sheet and instruction to access the V-Safe system.   Ms. Cenci was instructed to call 911 with any severe reactions post vaccine: Marland Kitchen Difficulty breathing  . Swelling of face and throat  . A fast heartbeat  . A bad rash all over body  . Dizziness and weakness   Immunizations Administered    Name Date Dose VIS Date Route   Pfizer COVID-19 Vaccine 11/24/2019 11:46 AM 0.3 mL 07/31/2019 Intramuscular   Manufacturer: [employer], [employer]   Lot: [id number]   NDC: [address]-1

## 2019-11-26 ENCOUNTER — Ambulatory Visit: Payer: 59

## 2019-11-26 ENCOUNTER — Other Ambulatory Visit: Payer: Self-pay

## 2019-12-01 DIAGNOSIS — G8929 Other chronic pain: Secondary | ICD-10-CM | POA: Diagnosis not present

## 2019-12-01 DIAGNOSIS — Z79899 Other long term (current) drug therapy: Secondary | ICD-10-CM | POA: Diagnosis not present

## 2019-12-01 DIAGNOSIS — M545 Low back pain: Secondary | ICD-10-CM | POA: Diagnosis not present

## 2019-12-01 DIAGNOSIS — M129 Arthropathy, unspecified: Secondary | ICD-10-CM | POA: Diagnosis not present

## 2019-12-01 DIAGNOSIS — G894 Chronic pain syndrome: Secondary | ICD-10-CM | POA: Diagnosis not present

## 2019-12-01 MED FILL — GABAPENTIN 300 MG CAPSULE: 300 | 30 days supply | Qty: 90 | Fill #0

## 2019-12-01 MED FILL — METHOCARBAMOL 750 MG TABS: 750 | 30 days supply | Qty: 30 | Fill #0

## 2019-12-01 MED FILL — ACETAMINOPHEN/COD #3 TABLET: 300-30 | 7 days supply | Qty: 14 | Fill #0

## 2019-12-03 DIAGNOSIS — E669 Obesity, unspecified: Secondary | ICD-10-CM | POA: Diagnosis not present

## 2019-12-03 DIAGNOSIS — I10 Essential (primary) hypertension: Secondary | ICD-10-CM | POA: Diagnosis not present

## 2019-12-03 DIAGNOSIS — E538 Deficiency of other specified B group vitamins: Secondary | ICD-10-CM | POA: Diagnosis not present

## 2019-12-03 DIAGNOSIS — R5383 Other fatigue: Secondary | ICD-10-CM | POA: Diagnosis not present

## 2019-12-03 DIAGNOSIS — Z7689 Persons encountering health services in other specified circumstances: Secondary | ICD-10-CM | POA: Diagnosis not present

## 2019-12-03 DIAGNOSIS — Z1159 Encounter for screening for other viral diseases: Secondary | ICD-10-CM | POA: Diagnosis not present

## 2019-12-03 DIAGNOSIS — E559 Vitamin D deficiency, unspecified: Secondary | ICD-10-CM | POA: Diagnosis not present

## 2019-12-03 DIAGNOSIS — M129 Arthropathy, unspecified: Secondary | ICD-10-CM | POA: Diagnosis not present

## 2019-12-03 DIAGNOSIS — E7800 Pure hypercholesterolemia, unspecified: Secondary | ICD-10-CM | POA: Diagnosis not present

## 2019-12-03 MED FILL — PHENTERMINE 15 MG CAPSULE: 15 | 30 days supply | Qty: 30 | Fill #0

## 2019-12-03 MED FILL — ONDANSETRON HCL 4 MG TABLET: 4 | 30 days supply | Qty: 9 | Fill #1

## 2019-12-10 NOTE — Chronic Care Management (AMB) (Signed)
Care Management   Initial Visit Note  12/10/2019 Name: [name] MRN: [id number] DOB: [birth year]  Subjective:   Objective:  Assessment: [name] is a 59 y.o. year old female who sees Bonnita Hollow, MD for primary care. The care management team was consulted for assistance with care management and care coordination needs related to Disease Management Educational Needs low back pain.   Review of patient status, including review of consultants reports, relevant laboratory and other test results, and collaboration with appropriate care team members and the patient's provider was performed as part of comprehensive patient evaluation and provision of care management services.    SDOH (Social Determinants of Health) assessments performed: No See Care Plan activities for detailed interventions related to [facility])     Outpatient Encounter Medications as of 11/26/2019  Medication Sig  . atorvastatin (LIPITOR) 40 MG tablet Take 1 tablet (40 mg total) by mouth daily.  . benzonatate (TESSALON) 100 MG capsule Take 1 capsule (100 mg total) by mouth 2 (two) times daily as needed for cough.  . cetirizine (ZYRTEC) 10 MG tablet Take 1 tablet (10 mg total) by mouth daily.  . fluticasone (FLONASE) 50 MCG/ACT nasal spray Place 2 sprays into both nostrils daily.  Marland Kitchen gabapentin (NEURONTIN) 300 MG capsule TAKE 1 CAPSULE BY MOUTH 3 TIMES DAILY.  Marland Kitchen HYDROcodone-acetaminophen (NORCO/VICODIN) 5-325 MG tablet Take 1-2 tablets by mouth every 6 (six) hours as needed for severe pain.  Marland Kitchen lidocaine (LIDODERM) 5 % Place 1 patch onto the skin daily. Remove & Discard patch within 12 hours or as directed by MD  . lisinopril (PRINIVIL,ZESTRIL) 10 MG tablet Take 10 mg by mouth daily.   . metFORMIN (GLUCOPHAGE) 500 MG tablet Take 1 tablet (500 mg total) by mouth 2 (two) times daily with a meal.  . methocarbamol (ROBAXIN) 500 MG tablet Take 1 tablet (500 mg total) by mouth 2 (two) times daily.  Marland Kitchen nystatin  (MYCOSTATIN/NYSTOP) powder Apply topically 4 (four) times daily. (Patient taking differently: Apply topically 4 (four) times daily. AS DIRECTED- TO AFFECTED AREAS)  . Olopatadine HCl 0.2 % SOLN Apply 1 drop to eye daily. (Patient not taking: Reported on 11/24/2018)  . ondansetron (ZOFRAN ODT) 8 MG disintegrating tablet Take 1 tablet (8 mg total) by mouth every 8 (eight) hours as needed for nausea or vomiting.  . ondansetron (ZOFRAN) 4 MG tablet Take 1 tablet (4 mg total) by mouth every 8 (eight) hours as needed for nausea or vomiting.  . polyethylene glycol powder (GLYCOLAX/MIRALAX) powder Take 17 g by mouth 2 (two) times daily as needed. (Patient taking differently: Take 17 g by mouth 2 (two) times daily as needed (for constipation). )  . [DISCONTINUED] sucralfate (CARAFATE) 1 G tablet Take 1 tablet (1 g total) by mouth 4 (four) times daily.   No facility-administered encounter medications on file as of 11/26/2019.    Goals Addressed            This Visit's Progress   . "I still have back pain" (pt-stated)       CARE PLAN ENTRY (see longtitudinal plan of care for additional care plan information)  Current Barriers:  . Chronic pain related to bilateral low back pain without sciatica as evidenced by patient reported pain scale of 6 /10  on pain scale.= patient states that she had a fall   Nurse Case Manager Clinical Goal(s):  Marland Kitchen Over the next 30 days, patient will verbalize understanding of plan for lowering pain level 1-2  points  Interventions:  . Evaluation of current treatment plan and patient's adherence to plan as established by provider. . Discussed plans with patient for ongoing care management follow up and provided patient with direct contact information for care management team . Reviewed scheduled/upcoming provider appointments  . Assume positions that help relivev pain=sitting up right with back up against a rest not bent . Patient uses ice during the day and ice at night and  goes to physical therapy to help with pain . She is working on losing some weight   Patient Self Care Activities:  . Patient verbalizes understanding of plan  . Attends all scheduled provider appointments . Calls pharmacy for medication refills . Calls provider office for new concerns or questions . Unable to independently self mange low back pain  Initial goal documentation         Follow up plan:  The care management team will reach out to the patient again over the next 21 days.  The patient has been provided with contact information for the care management team and has been advised to call with any health related questions or concerns.   Ms. Gatton was given information about Care Management services today including:  1. Care Management services include personalized support from designated clinical staff supervised by a physician, including individualized plan of care and coordination with other care providers 2. 24/7 contact phone numbers for assistance for urgent and routine care needs. 3. The patient may stop Care Management services at any time (effective at the end of the month) by phone call to the office staff.  Patient agreed to services and verbal consent obtained.  Juanell Fairly RN, BSN, [facility] Care Management Coordinator [facility] Phone: [phone] Fax: [phone]

## 2019-12-10 NOTE — Patient Instructions (Signed)
Visit Information  Goals Addressed            This Visit's Progress   . "I still have back pain" (pt-stated)       CARE PLAN ENTRY (see longtitudinal plan of care for additional care plan information)  Current Barriers:  . Chronic pain related to bilateral low back pain without sciatica as evidenced by patient reported pain scale of 6 /10  on pain scale.= patient states that she had a fall   Nurse Case Manager Clinical Goal(s):  [name] Kitchen Over the next 30 days, patient will verbalize understanding of plan for lowering pain level 1-2 points  Interventions:  . Evaluation of current treatment plan and patient's adherence to plan as established by provider. . Discussed plans with patient for ongoing care management follow up and provided patient with direct contact information for care management team . Reviewed scheduled/upcoming provider appointments  . Assume positions that help relivev pain=sitting up right with back up against a rest not bent . Patient uses ice during the day and ice at night and goes to physical therapy to help with pain . She is working on losing some weight   Patient Self Care Activities:  . Patient verbalizes understanding of plan  . Attends all scheduled provider appointments . Calls pharmacy for medication refills . Calls provider office for new concerns or questions . Unable to independently self mange low back pain  Initial goal documentation        [name] was given information about Care Management services today including:  1. Care Management services include personalized support from designated clinical staff supervised by her physician, including individualized plan of care and coordination with other care providers 2. 24/7 contact phone numbers for assistance for urgent and routine care needs. 3. The patient may stop CCM services at any time (effective at the end of the month) by phone call to the office staff.  Patient agreed to services and  verbal consent obtained.   The patient verbalized understanding of instructions provided today and declined a print copy of patient instruction materials.   The care management team will reach out to the patient again over the next 21 days.  The patient has been provided with contact information for the care management team and has been advised to call with any health related questions or concerns.   Juanell Fairly RN, BSN, [facility] Care Management Coordinator [facility] Phone: [phone] Fax: [phone]

## 2019-12-15 MED FILL — VIT D3-50 50,000 UNITS CAPS: 1.25 MG | 28 days supply | Qty: 4 | Fill #0

## 2019-12-17 ENCOUNTER — Ambulatory Visit: Payer: 59

## 2019-12-17 ENCOUNTER — Other Ambulatory Visit: Payer: Self-pay

## 2019-12-17 DIAGNOSIS — G8929 Other chronic pain: Secondary | ICD-10-CM

## 2019-12-17 NOTE — Patient Instructions (Signed)
Visit Information  Goals Addressed            This Visit's Progress   . "I still have back pain" (pt-stated)       CARE PLAN ENTRY (see longtitudinal plan of care for additional care plan information)  Current Barriers:  . Chronic pain related to bilateral low back pain without sciatica as evidenced by patient reported pain scale of 6 /10  on pain scale.= patient states that she had a fall   Nurse Case Manager Clinical Goal(s):  Marland Kitchen Over the next 30 days, patient will verbalize understanding of plan for lowering pain level 1-2 points  Interventions:  . Evaluation of current treatment plan and patient's adherence to plan as established by provider. . Discussed plans with patient for ongoing care management follow up and provided patient with direct contact information for care management team . Reviewed scheduled/upcoming provider appointments  . Assume positions that help relivev pain=sitting up right with back up against a rest not bent . Patient uses ice during the day and ice at night and goes to physical therapy to help with pain . She is working on losing some weight  . 12/17/19 . Patient states that she is still having pain  . She is able to work a job but it is sitting and she has to take breaks during her shift to lay down to relieve pain . Marland Kitchen She was given prescription for a medication Beluca and narcan   but she is unable to afford it.   RNCM  Will Make a referral to Pharmacy for Medication Assistance . Due to to the patient had been movinf around her pain level was at a 7/10 . She is sitting with a pillow, adjusting her position using heat and cold to help with the pain.  Patient Self Care Activities:  . Patient verbalizes understanding of plan  . Attends all scheduled provider appointments . Calls pharmacy for medication refills . Calls provider office for new concerns or questions . Unable to independently self mange low back pain  Initial goal documentation         [name] was given information about Care Management services today including:  1. Care Management services include personalized support from designated clinical staff supervised by her physician, including individualized plan of care and coordination with other care providers 2. 24/7 contact phone numbers for assistance for urgent and routine care needs. 3. The patient may stop CCM services at any time (effective at the end of the month) by phone call to the office staff.  Patient agreed to services and verbal consent obtained.   The patient verbalized understanding of instructions provided today and declined a print copy of patient instruction materials.   The care management team will reach out to the patient again over the next 14 days.  The patient has been provided with contact information for the care management team and has been advised to call with any health related questions or concerns.   Juanell Fairly RN, BSN, [facility] Care Management Coordinator [facility] Phone: [phone] Fax: [phone]

## 2019-12-17 NOTE — Chronic Care Management (AMB) (Signed)
  Care Management   Follow Up Note   12/17/2019 Name: [name] MRN: [id number] DOB: 08/25/1960  Referred by: Garnette Gunner, MD Reason for referral : Care Coordination (Care Management RNCM F/U Back Pain)   [name] is a 59 y.o. year old female who is a primary care patient of Garnette Gunner, MD. The care management team was consulted for assistance with care management and care coordination needs.    Review of patient status, including review of consultants reports, relevant laboratory and other test results, and collaboration with appropriate care team members and the patient's provider was performed as part of comprehensive patient evaluation and provision of chronic care management services.    SDOH (Social Determinants of Health) assessments performed: No See Care Plan activities for detailed interventions related to [facility])     Advanced Directives: See Care Plan and Vynca application for related entries.   Goals Addressed            This Visit's Progress   . "I still have back pain" (pt-stated)       CARE PLAN ENTRY (see longtitudinal plan of care for additional care plan information)  Current Barriers:  . Chronic pain related to bilateral low back pain without sciatica as evidenced by patient reported pain scale of 6 /10  on pain scale.= patient states that she had a fall   Nurse Case Manager Clinical Goal(s):  Marland Kitchen Over the next 30 days, patient will verbalize understanding of plan for lowering pain level 1-2 points  Interventions:  . Evaluation of current treatment plan and patient's adherence to plan as established by provider. . Discussed plans with patient for ongoing care management follow up and provided patient with direct contact information for care management team . Reviewed scheduled/upcoming provider appointments  . Assume positions that help relivev pain=sitting up right with back up against a rest not bent . Patient uses ice during the  day and ice at night and goes to physical therapy to help with pain . She is working on losing some weight  . 12/17/19 . Patient states that she is still having pain  . She is able to work a job but it is sitting and she has to take breaks during her shift to lay down to relieve pain . Marland Kitchen She was given prescription for a medication Beluca and narcan   but she is unable to afford it.   RNCM  Will Make a referral to Pharmacy for Medication Assistance . Due to to the patient had been movinf around her pain level was at a 7/10 . She is sitting with a pillow, adjusting her position using heat and cold to help with the pain.  Patient Self Care Activities:  . Patient verbalizes understanding of plan  . Attends all scheduled provider appointments . Calls pharmacy for medication refills . Calls provider office for new concerns or questions . Unable to independently self mange low back pain  Initial goal documentation         The care management team will reach out to the patient again over the next 14 days.  The patient has been provided with contact information for the care management team and has been advised to call with any health related questions or concerns.   Juanell Fairly RN, BSN, [facility] Care Management Coordinator [facility] Phone: [phone] Fax: [phone]

## 2019-12-25 ENCOUNTER — Other Ambulatory Visit: Payer: Self-pay | Admitting: Family Medicine

## 2019-12-25 DIAGNOSIS — R7303 Prediabetes: Secondary | ICD-10-CM

## 2019-12-25 MED FILL — ACETAMINOPHEN/COD #3 TABLET: 300-30 | 7 days supply | Qty: 14 | Fill #1

## 2019-12-25 MED FILL — VIT D3-50 50,000 UNITS CAPS: 1.25 MG | 28 days supply | Qty: 4 | Fill #0

## 2019-12-29 ENCOUNTER — Other Ambulatory Visit: Payer: Self-pay | Admitting: Family Medicine

## 2019-12-31 ENCOUNTER — Telehealth: Payer: 59

## 2019-12-31 DIAGNOSIS — G8929 Other chronic pain: Secondary | ICD-10-CM | POA: Diagnosis not present

## 2019-12-31 DIAGNOSIS — M545 Low back pain: Secondary | ICD-10-CM | POA: Diagnosis not present

## 2019-12-31 DIAGNOSIS — G894 Chronic pain syndrome: Secondary | ICD-10-CM | POA: Diagnosis not present

## 2019-12-31 DIAGNOSIS — Z79899 Other long term (current) drug therapy: Secondary | ICD-10-CM | POA: Diagnosis not present

## 2020-01-01 ENCOUNTER — Ambulatory Visit: Payer: 59

## 2020-01-01 ENCOUNTER — Other Ambulatory Visit: Payer: Self-pay

## 2020-01-01 DIAGNOSIS — R7303 Prediabetes: Secondary | ICD-10-CM | POA: Diagnosis not present

## 2020-01-01 DIAGNOSIS — E559 Vitamin D deficiency, unspecified: Secondary | ICD-10-CM | POA: Diagnosis not present

## 2020-01-01 DIAGNOSIS — E7800 Pure hypercholesterolemia, unspecified: Secondary | ICD-10-CM | POA: Diagnosis not present

## 2020-01-01 DIAGNOSIS — E538 Deficiency of other specified B group vitamins: Secondary | ICD-10-CM | POA: Diagnosis not present

## 2020-01-01 NOTE — Chronic Care Management (AMB) (Signed)
  Care Management   Outreach Note  01/01/2020 Name: [name] MRN: [id number] DOB: 1961/07/24  Referred by: Garnette Gunner, MD Reason for referral : Care Coordination (Care Management RNCM Medication f/u)   An unsuccessful telephone outreach was attempted today. The patient was referred to the case management team for assistance with care management and care coordination.   Follow Up Plan: A HIPPA compliant phone message was left for the patient providing contact information and requesting a return call.  The care management team will reach out to the patient again over the next 5-7 days.   Anne Fairly RN, BSN, [facility] Care Management Coordinator [facility] Phone: [phone] Fax: [phone]

## 2020-01-11 ENCOUNTER — Ambulatory Visit: Payer: 59

## 2020-01-11 ENCOUNTER — Other Ambulatory Visit: Payer: Self-pay

## 2020-01-11 NOTE — Patient Instructions (Signed)
Visit Information  Goals Addressed            This Visit's Progress   . COMPLETED: "I still have back pain" (pt-stated)       CARE PLAN ENTRY (see longtitudinal plan of care for additional care plan information)  Current Barriers:  . Chronic pain related to bilateral low back pain without sciatica as evidenced by patient reported pain scale of 6 /10  on pain scale.= patient states that she had a fall   Nurse Case Manager Clinical Goal(s):  Marland Kitchen Over the next 30 days, patient will verbalize understanding of plan for lowering pain level 1-2 points  Interventions:  . Evaluation of current treatment plan and patient's adherence to plan as established by provider. . Discussed plans with patient for ongoing care management follow up and provided patient with direct contact information for care management team . Reviewed scheduled/upcoming provider appointments  . Assume positions that help relivev pain=sitting up right with back up against a rest not bent . Patient uses ice during the day and ice at night and goes to physical therapy to help with pain . She is working on losing some weight  . 01/11/20 . Patient states that she still has pain that she rates at a 6/10 . She is still taking her medications as prescribed . Informed the patient about the information given about Belbuca and Narcan from Pharmacy regarding price . Patient is still use heat and Cold to help with pain.  Use also uses positioning. . She is walking.  She likes to to go parks where there are benches that she can sit to rest when needed. . The patient has no other needed from me but has agreed for me to call and check on her in three months . The patient has my number if she has any questions and concerns.  Patient Self Care Activities:  . Patient verbalizes understanding of plan  . Attends all scheduled provider appointments . Calls pharmacy for medication refills . Calls provider office for new concerns or  questions . Unable to independently self mange low back pain  Please see past updates related to this goal by clicking on the "Past Updates" button in the selected goal         Ms. Rajkumar was given information about Care Management services today including:  1. Care Management services include personalized support from designated clinical staff supervised by her physician, including individualized plan of care and coordination with other care providers 2. 24/7 contact phone numbers for assistance for urgent and routine care needs. 3. The patient may stop CCM services at any time (effective at the end of the month) by phone call to the office staff.  Patient agreed to services and verbal consent obtained.   The patient verbalized understanding of instructions provided today and declined a print copy of patient instruction materials.   Mrs. Holton it has been a pleasure serving you.  RNCM will follow up with the patient within the next. 3 months  Juanell Fairly RN, BSN, [facility] Care Management Coordinator [facility] Phone: [phone] Fax: [phone]

## 2020-01-11 NOTE — Chronic Care Management (AMB) (Signed)
  Care Management   Follow Up Note   01/11/2020 Name: [name] MRN: [id number] DOB: February 18, 1961  Referred by: Garnette Gunner, MD Reason for referral : Care Coordination (Care Management RNCM Back Pain/ Medications 2nd attempt)   [name] is a 59 y.o. year old female who is a primary care patient of Garnette Gunner, MD. The care management team was consulted for assistance with care management and care coordination needs.    Review of patient status, including review of consultants reports, relevant laboratory and other test results, and collaboration with appropriate care team members and the patient's provider was performed as part of comprehensive patient evaluation and provision of chronic care management services.    SDOH (Social Determinants of Health) assessments performed: No See Care Plan activities for detailed interventions related to [facility])     Advanced Directives: See Care Plan and Vynca application for related entries.   Goals Addressed            This Visit's Progress   . COMPLETED: "I still have back pain" (pt-stated)       CARE PLAN ENTRY (see longtitudinal plan of care for additional care plan information)  Current Barriers:  . Chronic pain related to bilateral low back pain without sciatica as evidenced by patient reported pain scale of 6 /10  on pain scale.= patient states that she had a fall   Nurse Case Manager Clinical Goal(s):  Marland Kitchen Over the next 30 days, patient will verbalize understanding of plan for lowering pain level 1-2 points  Interventions:  . Evaluation of current treatment plan and patient's adherence to plan as established by provider. . Discussed plans with patient for ongoing care management follow up and provided patient with direct contact information for care management team . Reviewed scheduled/upcoming provider appointments  . Assume positions that help relivev pain=sitting up right with back up against a rest not  bent . Patient uses ice during the day and ice at night and goes to physical therapy to help with pain . She is working on losing some weight  . 01/11/20 . Patient states that she still has pain that she rates at a 6/10 . She is still taking her medications as prescribed . Informed the patient about the information given about Belbuca and Narcan from Pharmacy regarding price . Patient is still use heat and Cold to help with pain.  Use also uses positioning. . She is walking.  She likes to to go parks where there are benches that she can sit to rest when needed. . The patient has no other needed from me but has agreed for me to call and check on her in three months . The patient has my number if she has any questions and concerns.  Patient Self Care Activities:  . Patient verbalizes understanding of plan  . Attends all scheduled provider appointments . Calls pharmacy for medication refills . Calls provider office for new concerns or questions . Unable to independently self mange low back pain  Please see past updates related to this goal by clicking on the "Past Updates" button in the selected goal        [name] it has been a pleasure serving you.  RNCM will follow up with the patient within the next. 3 months  Juanell Fairly RN, BSN, [place] Care Management Coordinator [facility] Phone: [phone] Fax: [phone]

## 2020-01-29 DIAGNOSIS — G894 Chronic pain syndrome: Secondary | ICD-10-CM | POA: Diagnosis not present

## 2020-01-29 DIAGNOSIS — M545 Low back pain: Secondary | ICD-10-CM | POA: Diagnosis not present

## 2020-01-29 DIAGNOSIS — Z79899 Other long term (current) drug therapy: Secondary | ICD-10-CM | POA: Diagnosis not present

## 2020-01-29 DIAGNOSIS — G8929 Other chronic pain: Secondary | ICD-10-CM | POA: Diagnosis not present

## 2020-02-01 ENCOUNTER — Other Ambulatory Visit: Payer: Self-pay | Admitting: Physician Assistant

## 2020-02-01 DIAGNOSIS — E8940 Asymptomatic postprocedural ovarian failure: Secondary | ICD-10-CM | POA: Diagnosis not present

## 2020-02-01 DIAGNOSIS — K429 Umbilical hernia without obstruction or gangrene: Secondary | ICD-10-CM | POA: Diagnosis not present

## 2020-02-01 MED FILL — ONDANSETRON HCL 4 MG TABLET: 4 | 30 days supply | Qty: 9 | Fill #3

## 2020-02-01 MED FILL — TOPIRAMATE 25 MG TAB: 25 | 30 days supply | Qty: 30 | Fill #0

## 2020-02-01 MED FILL — PHENTERMINE 37.5 MG TABLET: 37.5 | 30 days supply | Qty: 30 | Fill #0

## 2020-02-02 ENCOUNTER — Other Ambulatory Visit: Payer: Self-pay | Admitting: Physician Assistant

## 2020-02-02 DIAGNOSIS — E8940 Asymptomatic postprocedural ovarian failure: Secondary | ICD-10-CM

## 2020-02-02 MED FILL — METFORMIN HCL 500 MG TABS: 500 | 30 days supply | Qty: 60 | Fill #1

## 2020-02-03 ENCOUNTER — Other Ambulatory Visit: Payer: Self-pay | Admitting: *Deleted

## 2020-02-03 MED ORDER — GABAPENTIN 300 MG PO CAPS: 300.0000 mg | ORAL_CAPSULE | Freq: Three times a day (TID) | ORAL | 0 refills | Status: DC

## 2020-02-03 MED FILL — GABAPENTIN 300 MG CAPSULE: 300 | 30 days supply | Qty: 90 | Fill #0

## 2020-03-02 DIAGNOSIS — K5900 Constipation, unspecified: Secondary | ICD-10-CM | POA: Diagnosis not present

## 2020-03-02 DIAGNOSIS — R109 Unspecified abdominal pain: Secondary | ICD-10-CM | POA: Diagnosis not present

## 2020-03-02 DIAGNOSIS — K432 Incisional hernia without obstruction or gangrene: Secondary | ICD-10-CM | POA: Diagnosis not present

## 2020-03-02 NOTE — Progress Notes (Signed)
No Show

## 2020-03-03 ENCOUNTER — Ambulatory Visit (INDEPENDENT_AMBULATORY_CARE_PROVIDER_SITE_OTHER): Payer: 59 | Admitting: Family Medicine

## 2020-03-03 DIAGNOSIS — Z5329 Procedure and treatment not carried out because of patient's decision for other reasons: Secondary | ICD-10-CM

## 2020-03-11 ENCOUNTER — Other Ambulatory Visit: Payer: Self-pay | Admitting: General Surgery

## 2020-03-11 DIAGNOSIS — K432 Incisional hernia without obstruction or gangrene: Secondary | ICD-10-CM

## 2020-03-17 DIAGNOSIS — I10 Essential (primary) hypertension: Secondary | ICD-10-CM | POA: Diagnosis not present

## 2020-03-17 DIAGNOSIS — R7303 Prediabetes: Secondary | ICD-10-CM | POA: Diagnosis not present

## 2020-03-17 DIAGNOSIS — E559 Vitamin D deficiency, unspecified: Secondary | ICD-10-CM | POA: Diagnosis not present

## 2020-03-17 DIAGNOSIS — Z1159 Encounter for screening for other viral diseases: Secondary | ICD-10-CM | POA: Diagnosis not present

## 2020-03-17 DIAGNOSIS — E7800 Pure hypercholesterolemia, unspecified: Secondary | ICD-10-CM | POA: Diagnosis not present

## 2020-03-17 DIAGNOSIS — R5383 Other fatigue: Secondary | ICD-10-CM | POA: Diagnosis not present

## 2020-03-17 MED FILL — PHENTERMINE 37.5 MG TABLET: 37.5 | 30 days supply | Qty: 30 | Fill #0

## 2020-03-17 MED FILL — TOPIRAMATE 25 MG TAB: 25 | 30 days supply | Qty: 60 | Fill #0

## 2020-03-21 MED FILL — METFORMIN HCL 500 MG TABS: 500 | 30 days supply | Qty: 60 | Fill #2

## 2020-03-21 MED FILL — ATORVASTATIN 40 MG TABLET: 40 | 30 days supply | Qty: 30 | Fill #1

## 2020-03-22 ENCOUNTER — Ambulatory Visit
Admission: RE | Admit: 2020-03-22 | Discharge: 2020-03-22 | Disposition: A | Discharge status: Home / Self Care | Payer: 59 | Source: Ambulatory Visit | Attending: General Surgery | Admitting: General Surgery

## 2020-03-22 ENCOUNTER — Other Ambulatory Visit: Payer: Self-pay

## 2020-03-22 DIAGNOSIS — K432 Incisional hernia without obstruction or gangrene: Secondary | ICD-10-CM

## 2020-03-22 DIAGNOSIS — K469 Unspecified abdominal hernia without obstruction or gangrene: Secondary | ICD-10-CM | POA: Diagnosis not present

## 2020-03-22 DIAGNOSIS — K439 Ventral hernia without obstruction or gangrene: Secondary | ICD-10-CM | POA: Diagnosis not present

## 2020-03-22 DIAGNOSIS — I700 Atherosclerosis of aorta: Secondary | ICD-10-CM | POA: Diagnosis not present

## 2020-03-22 MED ORDER — IOPAMIDOL (ISOVUE-300) INJECTION 61%
100.0000 mL | Freq: Once | INTRAVENOUS | Status: AC | PRN
  Administered 2020-03-22: 100 mL via INTRAVENOUS

## 2020-03-29 DIAGNOSIS — G894 Chronic pain syndrome: Secondary | ICD-10-CM | POA: Diagnosis not present

## 2020-03-29 DIAGNOSIS — Z79899 Other long term (current) drug therapy: Secondary | ICD-10-CM | POA: Diagnosis not present

## 2020-03-29 DIAGNOSIS — M545 Low back pain: Secondary | ICD-10-CM | POA: Diagnosis not present

## 2020-03-29 DIAGNOSIS — G8929 Other chronic pain: Secondary | ICD-10-CM | POA: Diagnosis not present

## 2020-03-29 DIAGNOSIS — E669 Obesity, unspecified: Secondary | ICD-10-CM | POA: Diagnosis not present

## 2020-03-29 MED FILL — ACETAMINOPHEN-COD #3 TABLET: 300-30 | 30 days supply | Qty: 90 | Fill #0

## 2020-03-29 MED FILL — METHOCARBAMOL 750 MG TABS: 750 | 30 days supply | Qty: 30 | Fill #0

## 2020-04-08 ENCOUNTER — Telehealth: Payer: Self-pay | Admitting: *Deleted

## 2020-04-08 NOTE — Chronic Care Management (AMB) (Signed)
  Care Management   Note  04/08/2020 Name: [name] MRN: [id number] DOB: 30-Jun-1961  [name] is a 59 y.o. year old female who is a primary care patient of Jackelyn Poling, DO and is actively engaged with the care management team. I reached out to [name] by phone today to assist with re-scheduling a follow up visit with the RN Case Manager.  Follow up plan: Unsuccessful telephone outreach attempt made. A HIPPA compliant phone message was left for the patient providing contact information and requesting a return call. The care management team will reach out to the patient again over the next 7 days. If patient returns call to provider office, please advise to call Embedded Care Management Care Guide Gwenevere Ghazi at [phone].  Gwenevere Ghazi  Care Guide, Embedded Care Coordination [facility] Management

## 2020-04-12 ENCOUNTER — Telehealth: Payer: 59

## 2020-04-15 ENCOUNTER — Telehealth: Payer: Self-pay

## 2020-04-15 NOTE — Telephone Encounter (Signed)
Patient calls nurse line requesting financial assistance. Patient asks the packet be mailed to her home. Packet has been placed in the mail for her. Patient informed she will need to fill out and mail to highlighted address for processing.

## 2020-04-15 NOTE — Chronic Care Management (AMB) (Signed)
  Care Management   Note  04/15/2020 Name: [name] MRN: [id number] DOB: Nov 07, 1960  [name] is a 59 y.o. year old female who is a primary care patient of Jackelyn Poling, DO and is actively engaged with the care management team. I reached out to [name] by phone today to assist with re-scheduling a follow up visit with the RN Case Manager.  Follow up plan: A second unsuccessful telephone outreach attempt made. A HIPPA compliant phone message was left for the patient providing contact information and requesting a return call. The care management team will reach out to the patient again over the next 7 days. If patient returns call to provider office, please advise to call Embedded Care Management Care Guide Gwenevere Ghazi at [phone].  Gwenevere Ghazi  Care Guide, Embedded Care Coordination [facility] Management

## 2020-04-21 NOTE — Chronic Care Management (AMB) (Signed)
  Care Management   Note  04/21/2020 Name: [name] MRN: [id number] DOB: 1961/02/06  [name] is a 59 y.o. year old female who is a primary care patient of Jackelyn Poling, DO and is actively engaged with the care management team. I reached out to [name] by phone today to assist with re-scheduling a follow up visit with the RN Case Manager.  Follow up plan: Telephone appointment with care management team member scheduled for: 05/06/2020  [facility] Guide, Embedded Care Coordination [place]  Care Management   .

## 2020-04-29 DIAGNOSIS — M545 Low back pain: Secondary | ICD-10-CM | POA: Diagnosis not present

## 2020-04-29 DIAGNOSIS — G894 Chronic pain syndrome: Secondary | ICD-10-CM | POA: Diagnosis not present

## 2020-04-29 DIAGNOSIS — Z7689 Persons encountering health services in other specified circumstances: Secondary | ICD-10-CM | POA: Diagnosis not present

## 2020-04-29 DIAGNOSIS — G8929 Other chronic pain: Secondary | ICD-10-CM | POA: Diagnosis not present

## 2020-04-29 DIAGNOSIS — Z79899 Other long term (current) drug therapy: Secondary | ICD-10-CM | POA: Diagnosis not present

## 2020-04-29 MED FILL — ACETAMINOPHEN-COD #3 TABLET: 300-30 | 30 days supply | Qty: 90 | Fill #0

## 2020-04-29 MED FILL — METHOCARBAMOL 750 MG TABS: 750 | 30 days supply | Qty: 30 | Fill #0

## 2020-05-06 ENCOUNTER — Ambulatory Visit: Payer: 59

## 2020-05-06 NOTE — Chronic Care Management (AMB) (Signed)
  Care Management   Follow Up Note   05/06/2020 Name: [name] MRN: [id number] DOB: 1960-10-05  Referred by: Jackelyn Poling, DO Reason for referral : Appointment (90 Day F/U)   [name] is a 59 y.o. year old female who is a primary care patient of Jackelyn Poling, DO. The care management team was consulted for assistance with care management and care coordination needs.    Review of patient status, including review of consultants reports, relevant laboratory and other test results, and collaboration with appropriate care team members and the patient's provider was performed as part of comprehensive patient evaluation and provision of chronic care management services.    SDOH (Social Determinants of Health) assessments performed: No See Care Plan activities for detailed interventions related to [facility])     Advanced Directives: See Care Plan and Vynca application for related entries.   Goals Addressed              This Visit's Progress   .  "I still have back pain" (pt-stated)        CARE PLAN ENTRY (see longtitudinal plan of care for additional care plan information)  Current Barriers:  . Chronic pain related to bilateral low back pain without sciatica as evidenced by patient reported pain scale of 6 /10  on pain scale.= patient states that she had a fall   Nurse Case Manager Clinical Goal(s):  Marland Kitchen Over the next 30 days, patient will verbalize understanding of plan for lowering pain level 1-2 points  Interventions:  . Evaluation of current treatment plan and patient's adherence to plan as established by provider. . Discussed plans with patient for ongoing care management follow up and provided patient with direct contact information for care management team . Reviewed scheduled/upcoming provider appointments  . Assume positions that help relivev pain=sitting up right with back up against a rest not bent . Patient uses ice during the day and ice at night and goes to  physical therapy to help with pain . She is working on losing some weight  . Patient states that she is doing "as well as can be expected".  She is down to one job and going to pain management has been expensive for her. She is going to have to decide if she will continue to go to pain management. . Advised patient to continue taking her meds, using heat/cold packs,stretching and positioning . The patient has my number if she has any questions and concerns.  Patient Self Care Activities:  . Patient verbalizes understanding of plan  . Attends all scheduled provider appointments . Calls pharmacy for medication refills . Calls provider office for new concerns or questions . Unable to independently self mange low back pain  Please see past updates related to this goal by clicking on the "Past Updates" button in the selected goal          RNCM will follow up with the patient within the next month of December   Grainger Mccarley RN, BSN, [facility] Care Management Coordinator [facility] Phone: [phone] Fax: [phone]

## 2020-05-06 NOTE — Patient Instructions (Signed)
Visit Information  Goals Addressed              This Visit's Progress   .  "I still have back pain" (pt-stated)        CARE PLAN ENTRY (see longtitudinal plan of care for additional care plan information)  Current Barriers:  . Chronic pain related to bilateral low back pain without sciatica as evidenced by patient reported pain scale of 6 /10  on pain scale.= patient states that she had a fall   Nurse Case Manager Clinical Goal(s):  Anne Kitchen Over the next 30 days, patient will verbalize understanding of plan for lowering pain level 1-2 points  Interventions:  . Evaluation of current treatment plan and patient's adherence to plan as established by provider. . Discussed plans with patient for ongoing care management follow up and provided patient with direct contact information for care management team . Reviewed scheduled/upcoming provider appointments  . Assume positions that help relivev pain=sitting up right with back up against a rest not bent . Patient uses ice during the day and ice at night and goes to physical therapy to help with pain . She is working on losing some weight  . Patient states that she is doing "as well as can be expected".  She is down to one job and going to pain management has been expensive for her. She is going to have to decide if she will continue to go to pain management. . Advised patient to continue taking her meds, using heat/cold packs,stretching and positioning . The patient has my number if she has any questions and concerns.  Patient Self Care Activities:  . Patient verbalizes understanding of plan  . Attends all scheduled provider appointments . Calls pharmacy for medication refills . Calls provider office for new concerns or questions . Unable to independently self mange low back pain  Please see past updates related to this goal by clicking on the "Past Updates" button in the selected goal         [name] was given information about Care  Management services today including:  1. Care Management services include personalized support from designated clinical staff supervised by her physician, including individualized plan of care and coordination with other care providers 2. 24/7 contact phone numbers for assistance for urgent and routine care needs. 3. The patient may stop CCM services at any time (effective at the end of the month) by phone call to the office staff.  Patient agreed to services and verbal consent obtained.   The patient verbalized understanding of instructions provided today and declined a print copy of patient instruction materials.   RNCM will follow up with the patient within the next month of December   Mayme Profeta RN, BSN, [facility] Care Management Coordinator [facility] Phone: [phone] Fax: [phone]

## 2020-05-10 MED FILL — ACETAMINOPHEN-COD #3 TABLET: 300-30 | 30 days supply | Qty: 90 | Fill #0

## 2020-06-01 ENCOUNTER — Other Ambulatory Visit (HOSPITAL_COMMUNITY): Payer: Self-pay | Admitting: Nurse Practitioner

## 2020-06-01 DIAGNOSIS — Z79899 Other long term (current) drug therapy: Secondary | ICD-10-CM | POA: Diagnosis not present

## 2020-06-01 DIAGNOSIS — G8929 Other chronic pain: Secondary | ICD-10-CM | POA: Diagnosis not present

## 2020-06-01 DIAGNOSIS — G894 Chronic pain syndrome: Secondary | ICD-10-CM | POA: Diagnosis not present

## 2020-06-01 DIAGNOSIS — M545 Low back pain, unspecified: Secondary | ICD-10-CM | POA: Diagnosis not present

## 2020-06-01 DIAGNOSIS — E669 Obesity, unspecified: Secondary | ICD-10-CM | POA: Diagnosis not present

## 2020-06-01 MED FILL — METHOCARBAMOL 750 MG TABS: 750 | 30 days supply | Qty: 30 | Fill #0

## 2020-06-17 DIAGNOSIS — M5416 Radiculopathy, lumbar region: Secondary | ICD-10-CM | POA: Diagnosis not present

## 2020-06-17 DIAGNOSIS — M545 Low back pain, unspecified: Secondary | ICD-10-CM | POA: Diagnosis not present

## 2020-06-27 DIAGNOSIS — Z20822 Contact with and (suspected) exposure to covid-19: Secondary | ICD-10-CM | POA: Diagnosis not present

## 2020-07-06 MED FILL — ACETAMINOPHEN-COD #3 TABLET: 300-30 | 30 days supply | Qty: 90 | Fill #0

## 2020-07-06 MED FILL — METFORMIN HCL 500 MG TABS: 500 | 30 days supply | Qty: 60 | Fill #3

## 2020-07-07 ENCOUNTER — Other Ambulatory Visit: Payer: Self-pay | Admitting: Family Medicine

## 2020-07-07 ENCOUNTER — Other Ambulatory Visit: Payer: Self-pay

## 2020-07-07 MED ORDER — GABAPENTIN 300 MG PO CAPS: 300.0000 mg | ORAL_CAPSULE | Freq: Three times a day (TID) | ORAL | 0 refills | Status: DC

## 2020-07-07 MED FILL — GABAPENTIN 300 MG CAPSULE: 300 | 30 days supply | Qty: 90 | Fill #0

## 2020-08-05 ENCOUNTER — Telehealth: Payer: 59

## 2020-08-08 ENCOUNTER — Other Ambulatory Visit (HOSPITAL_COMMUNITY): Payer: Self-pay | Admitting: Orthopedic Surgery

## 2020-08-08 DIAGNOSIS — M545 Low back pain, unspecified: Secondary | ICD-10-CM | POA: Diagnosis not present

## 2020-08-08 DIAGNOSIS — M5416 Radiculopathy, lumbar region: Secondary | ICD-10-CM | POA: Diagnosis not present

## 2020-08-09 MED FILL — METHYLPREDNISOLONE 4 MG TBP: 4 | 6 days supply | Qty: 21 | Fill #0

## 2020-10-03 NOTE — Patient Instructions (Signed)
It was great to see you! Thank you for allowing me to participate in your care!  Our plans for today:  -We are going to check your cholesterol -We checked your A1c it had increased slightly to 6.2 -I provided refills for your medication -Your blood pressure was elevated today I would like for you to check your blood pressures at least once per day for the next week and make a follow-up appointment with me bringing these numbers. -For your low back pain since the previous orthopedic surgeon said they request you to have weight loss prior to surgery I am get you referred to medical weight management, they should contact you in the next 1 week to set up an appointment -At our next appointment we can discuss some options for weight loss as well  We are checking some labs today, I will call you if they are abnormal will send you a MyChart message or a letter if they are normal.  If you do not hear about your labs in the next 2 weeks please let us know.  Take care and seek immediate care sooner if you develop any concerns.   Dr. Jackelyn Poling, DO [facility]

## 2020-10-03 NOTE — Progress Notes (Signed)
SUBJECTIVE:   CHIEF COMPLAINT / HPI:   Chronic low back pain: Patient 60 year old female who presents today to discuss back pain.  Patient is previously seen on 06/01/2019 with worsening back pain at that time.  She does have a history of lumbar spine surgery.  At that time x-rays were performed which showed no acute fracture but multilevel degenerative disc disease with greatest at L4-L5 and L5-1.  Patient did have an MR lumbar spine on 08/29/2019 which showed progressive degenerative disc disease with mild spinal canal stenosis and moderate right, severe left neural foraminal stenosis at L4-L5 and unchanged moderate right and severe left neural foraminal stenosis at L5-S1.  Prediabetes - Follow Up: Patient is a 60 y.o. female who present today for diabetic follow up.   Patient endorses difficulties affording medications in the past due to insurance issues.  Home medications include: Metformin 500 mg twice daily Patient endorses being out of these medications for a month or longer.  Most recent A1Cs:  Lab Results  Component Value Date   HGBA1C 6.2 (A) 10/04/2020   HGBA1C 5.8 (A) 10/21/2018   HGBA1C 5.9 12/25/2016   Last Microalbumin, LDL, Creatinine: Lab Results  Component Value Date   LDLCALC 155 (H) 10/21/2018   CREATININE 0.86 11/24/2018    Hypertension: Patient is a 60 y.o. female who present today for follow up of hypertension.   Patient endorses difficulties affording medications  Home medications include: lisinopril  Patient endorses being out of this medication for months.   Most recent creatinine trend:  Lab Results  Component Value Date   CREATININE 0.86 11/24/2018   CREATININE 0.82 10/21/2018   CREATININE 0.92 10/14/2017   Patient does check blood pressure at home.  Patient has had a BMP in the past 1 year.  PERTINENT  PMH / PSH: History of chronic low back pain  OBJECTIVE:   BP (!) 160/72   Pulse 75   Ht 5\' 7"  (1.702 m)   Wt 299 lb (135.6 kg)    SpO2 98%   BMI 46.83 kg/m    General: NAD, pleasant, able to participate in exam Cardiac: RRR, no murmurs. Respiratory: CTAB, normal effort Extremities: no edema or cyanosis. MSK: No midline discomfort, some pain to the palpation of the musculature on the lateral aspect of the lumbar spine, negative modified straight leg test Neuro: alert, no obvious focal deficits Psych: Normal affect and mood  ASSESSMENT/PLAN:   Chronic bilateral low back pain without sciatica Assessment: History of low back pain with previous low back surgery. She has been evaluated by orthopedic surgery after having x-rays and an MRI of her lumbar spine, however orthopedic surgery had discussed with the patient that she needs to lose weight in order to consider to be a surgical candidate. Patient has had physical therapy for her lower back in the past as well. Physical exam with no midline tenderness, negative modified straight leg testing. Patient at this time was given the option for referral to orthopedic surgery again or referral for medical weight loss. Patient opts to get referral for medical weight loss. She understands the wait time may be prolonged. She plans to make a follow-up appointment with me in the next few weeks to discuss modalities for weight loss until she can get established with them.   Prediabetes Assessment/plan: History of prediabetes. A1c checked today of 6.2. Continues on Metformin though she has been out of this medication for about a month. Refilled Metformin today. We will bring patient back  in 3 to 6 months for recheck of A1c.  HYPERTENSION, BENIGN SYSTEMIC Hypertension: 60 year old female who has not been taking her lisinopril for at least 1-2 months with elevated blood pressure in clinic today.  She does have a blood pressure cuff.  Patient plans to check her blood pressure daily for the next 1 week and make a follow appointment with me.  Her medications have been refilled and sent to her  pharmacy which she plans to reinitiate today. We will check a BMP today and have patient come back in 1 week for repeat BMP and blood pressure check since she is restarting lisinopril.   We will check cholesterol panel today  Jackelyn Poling, DO [facility]    This note was prepared using Dragon voice recognition software and may include unintentional dictation errors due to the inherent limitations of voice recognition software.

## 2020-10-04 ENCOUNTER — Encounter: Payer: Self-pay | Admitting: Family Medicine

## 2020-10-04 ENCOUNTER — Other Ambulatory Visit: Payer: Self-pay | Admitting: Family Medicine

## 2020-10-04 ENCOUNTER — Other Ambulatory Visit: Payer: Self-pay

## 2020-10-04 ENCOUNTER — Ambulatory Visit (INDEPENDENT_AMBULATORY_CARE_PROVIDER_SITE_OTHER): Payer: 59 | Admitting: Family Medicine

## 2020-10-04 VITALS — BP 160/72 | HR 75 | Ht 67.0 in | Wt 299.0 lb

## 2020-10-04 DIAGNOSIS — I10 Essential (primary) hypertension: Secondary | ICD-10-CM | POA: Diagnosis not present

## 2020-10-04 DIAGNOSIS — E785 Hyperlipidemia, unspecified: Secondary | ICD-10-CM

## 2020-10-04 DIAGNOSIS — Z23 Encounter for immunization: Secondary | ICD-10-CM

## 2020-10-04 DIAGNOSIS — G8929 Other chronic pain: Secondary | ICD-10-CM

## 2020-10-04 DIAGNOSIS — E119 Type 2 diabetes mellitus without complications: Secondary | ICD-10-CM | POA: Insufficient documentation

## 2020-10-04 DIAGNOSIS — M545 Low back pain, unspecified: Secondary | ICD-10-CM

## 2020-10-04 DIAGNOSIS — R7303 Prediabetes: Secondary | ICD-10-CM | POA: Diagnosis not present

## 2020-10-04 LAB — POCT GLYCOSYLATED HEMOGLOBIN (HGB A1C): Hemoglobin A1C: 6.2 % — AB (ref 4.0–5.6)

## 2020-10-04 MED ORDER — GABAPENTIN 300 MG PO CAPS: 300.0000 mg | ORAL_CAPSULE | Freq: Three times a day (TID) | ORAL | 0 refills | Status: DC

## 2020-10-04 MED ORDER — METFORMIN HCL 500 MG PO TABS: ORAL_TABLET | ORAL | 3 refills | Status: DC

## 2020-10-04 MED ORDER — ATORVASTATIN CALCIUM 40 MG PO TABS: 40.0000 mg | ORAL_TABLET | Freq: Every day | ORAL | 3 refills | Status: DC

## 2020-10-04 MED ORDER — LISINOPRIL 10 MG PO TABS: 10.0000 mg | ORAL_TABLET | Freq: Every day | ORAL | 0 refills | Status: DC

## 2020-10-04 MED FILL — GABAPENTIN 300 MG CAPSULE: 300 | 30 days supply | Qty: 90 | Fill #0

## 2020-10-04 MED FILL — METFORMIN HCL 500 MG TABS: 500 | 30 days supply | Qty: 60 | Fill #0

## 2020-10-04 MED FILL — LISINOPRIL 10 MG TABS: 10 | 30 days supply | Qty: 30 | Fill #0

## 2020-10-04 MED FILL — ATORVASTATIN 40 MG TABLET: 40 | 30 days supply | Qty: 30 | Fill #0

## 2020-10-04 NOTE — Assessment & Plan Note (Addendum)
Assessment/plan: History of prediabetes. A1c checked today of 6.2. Continues on Metformin though she has been out of this medication for about a month. Refilled Metformin today. We will bring patient back in 3 to 6 months for recheck of A1c.

## 2020-10-04 NOTE — Assessment & Plan Note (Signed)
Assessment: History of low back pain with previous low back surgery. She has been evaluated by orthopedic surgery after having x-rays and an MRI of her lumbar spine, however orthopedic surgery had discussed with the patient that she needs to lose weight in order to consider to be a surgical candidate. Patient has had physical therapy for her lower back in the past as well. Physical exam with no midline tenderness, negative modified straight leg testing. Patient at this time was given the option for referral to orthopedic surgery again or referral for medical weight loss. Patient opts to get referral for medical weight loss. She understands the wait time may be prolonged. She plans to make a follow-up appointment with me in the next few weeks to discuss modalities for weight loss until she can get established with them.

## 2020-10-04 NOTE — Assessment & Plan Note (Signed)
Hypertension: 60 year old female who has not been taking her lisinopril for at least 1-2 months with elevated blood pressure in clinic today.  She does have a blood pressure cuff.  Patient plans to check her blood pressure daily for the next 1 week and make a follow appointment with me.  Her medications have been refilled and sent to her pharmacy which she plans to reinitiate today. We will check a BMP today and have patient come back in 1 week for repeat BMP and blood pressure check since she is restarting lisinopril.

## 2020-10-05 LAB — LIPID PANEL
Chol/HDL Ratio: 5 ratio — ABNORMAL HIGH (ref 0.0–4.4)
Cholesterol, Total: 252 mg/dL — ABNORMAL HIGH (ref 100–199)
HDL: 50 mg/dL (ref >39)
LDL Chol Calc (NIH): 175 mg/dL — ABNORMAL HIGH (ref 0–99)
Triglycerides: 150 mg/dL — ABNORMAL HIGH (ref 0–149)
VLDL Cholesterol Cal: 27 mg/dL (ref 5–40)

## 2020-10-05 LAB — BASIC METABOLIC PANEL
BUN/Creatinine Ratio: 12 (ref 12–28)
BUN: 10 mg/dL (ref 8–27)
CO2: 24 mmol/L (ref 20–29)
Calcium: 9.3 mg/dL (ref 8.7–10.3)
Chloride: 99 mmol/L (ref 96–106)
Creatinine, Ser: 0.82 mg/dL (ref 0.57–1.00)
GFR calc Af Amer: 89 mL/min/{1.73_m2} (ref >59)
GFR calc non Af Amer: 77 mL/min/{1.73_m2} (ref >59)
Glucose: 84 mg/dL (ref 65–99)
Potassium: 4.1 mmol/L (ref 3.5–5.2)
Sodium: 137 mmol/L (ref 134–144)

## 2020-10-11 ENCOUNTER — Ambulatory Visit: Payer: 59 | Admitting: Family Medicine

## 2020-10-26 ENCOUNTER — Ambulatory Visit: Payer: 59 | Admitting: Family Medicine

## 2020-11-06 NOTE — Progress Notes (Deleted)
    SUBJECTIVE:   CHIEF COMPLAINT / HPI:   Encounter to discuss weight loss: Patient is a 60 year old female that presents today for discussion of modalities to help her achieve weight loss.  She is currently awaiting a referral for Medical weight loss clinic.  She is interested in having a surgical procedure for her low back pain however was told that she needs to lose weight in order to be considered a surgical candidate. She states she currently eats***times per day.  She typically wakes up at***and goes to bed at***.  Her exercise includes***.  PERTINENT  PMH / PSH: ***  OBJECTIVE:   There were no vitals taken for this visit.   General: NAD, pleasant, able to participate in exam Respiratory: No respiratory distress Skin: warm and dry, no rashes noted Psych: Normal affect and mood  ASSESSMENT/PLAN:   No problem-specific Assessment & Plan notes found for this encounter.     Jackelyn Poling, DO [facility]    This note was prepared using Dragon voice recognition software and may include unintentional dictation errors due to the inherent limitations of voice recognition software.  {    This will disappear when note is signed, click to select method of visit    :1}

## 2020-11-06 NOTE — Patient Instructions (Incomplete)
It was great to see you! Thank you for allowing me to participate in your care!  I recommend that you always bring your medications to each appointment as this makes it easy to ensure we are on the correct medications and helps us not miss when refills are needed.  Our plans for today:  - *** -   We are checking some labs today, I will call you if they are abnormal will send you a MyChart message or a letter if they are normal.  If you do not hear about your labs in the next 2 weeks please let us know.***  Take care and seek immediate care sooner if you develop any concerns.   Dr. Ryan Welborn, DO [facility]  

## 2020-11-07 ENCOUNTER — Ambulatory Visit: Payer: 59 | Admitting: Family Medicine

## 2020-11-17 NOTE — Progress Notes (Signed)
    SUBJECTIVE:   CHIEF COMPLAINT / HPI:   Left eye pain Had broken tooth last year, hasn't had it fixed yet. For 10-12 days she has had swelling and pain in the left side of her face and back toward her ear. Her eye lid has been sore and swollen. Her eyelid has been swollen for more than 10 days. Has no photophobia. No change in vision. Moving eyes around does not cause pain. Had a fever of 101F one week ago. She has been taking tylenol daily since then and has not noted any further fevers.  She states that she had something like this last year and used erythromycin ointment which improved the symptoms.   PERTINENT  PMH / PSH: None relevant  OBJECTIVE:   BP 140/78   Pulse 83   Ht 5\' 7"  (1.702 m)   Wt 298 lb 2 oz (135.2 kg)   SpO2 96%   BMI 46.69 kg/m    General: NAD, pleasant, able to participate in exam HEENT: Left eye with some swelling of the upper and lower eyelid.  Some conjunctival injection.  No photophobia on physical exam.  Ophthalmology exam was performed.  Patient with no pain with extraocular movement. Respiratory: No respiratory distress Skin: warm and dry, no rashes noted Psych: Normal affect and mood  ASSESSMENT/PLAN:   Blepharitis Assessment: 60 year old female with left eyelid pain present for about 10 days.  She states she had this in the past and previous used erythromycin ointment which quickly resolved it.  She has continued to use warm compresses.  On physical exam she has good range of motion with her eyes with no pain with extraocular movement.  She has some conjunctival injection but no photophobia on physical exam.  She has some swelling to the upper and lower eyelid with mild erythema but it does not appear cellulitic on exam.  Overall differential can include blepharitis versus preseptal cellulitis.  Patient does have a picture of her eye from about 5 days ago and it looks to have improved significantly at this point. Plan: -Discussed patient with Dr.  66 who also came to evaluate patient and assistance -We will prescribe erythromycin ointment per patient request for 5 days -Patient will follow up if her pain worsens or if it does not improve after 5 days. -Recommended continuing to use warm compresses and baby shampoo to keep the eyelids clean.     Jennette Kettle, DO [facility]    This note was prepared using Dragon voice recognition software and may include unintentional dictation errors due to the inherent limitations of voice recognition software.

## 2020-11-17 NOTE — Patient Instructions (Signed)
It was great to see you! Thank you for allowing me to participate in your care!  I recommend that you always bring your medications to each appointment as this makes it easy to ensure we are on the correct medications and helps [place] not miss when refills are needed.  Our plans for today:  -I am prescribing some erythromycin ointment to go in your eye.  I want to use this twice per day for the next 5 days.  If your eye pain worsens in the meantime I want you to come back sooner, otherwise I will see you in 5 days unless it has completely improved.  There is back to normal you do not have to see me for that appointment.  I would like to follow-up with you at your convenience to further discuss weight loss and how we can help you with other medical problems.  Take care and seek immediate care sooner if you develop any concerns.   Dr. Jackelyn Poling, DO [facility]

## 2020-11-22 ENCOUNTER — Other Ambulatory Visit (HOSPITAL_COMMUNITY): Payer: Self-pay

## 2020-11-22 ENCOUNTER — Other Ambulatory Visit: Payer: Self-pay

## 2020-11-22 ENCOUNTER — Ambulatory Visit (INDEPENDENT_AMBULATORY_CARE_PROVIDER_SITE_OTHER): Payer: Self-pay | Admitting: Family Medicine

## 2020-11-22 DIAGNOSIS — H01009 Unspecified blepharitis unspecified eye, unspecified eyelid: Secondary | ICD-10-CM | POA: Insufficient documentation

## 2020-11-22 DIAGNOSIS — H0100B Unspecified blepharitis left eye, upper and lower eyelids: Secondary | ICD-10-CM

## 2020-11-22 HISTORY — DX: Unspecified blepharitis unspecified eye, unspecified eyelid: H01.009

## 2020-11-22 MED ORDER — LISINOPRIL 10 MG PO TABS
10.0000 mg | ORAL_TABLET | Freq: Every day | ORAL | 0 refills | Status: DC
  Filled 2020-11-22: qty 90, 90d supply, fill #0

## 2020-11-22 MED ORDER — ERYTHROMYCIN 5 MG/GM OP OINT
1.0000 "application " | TOPICAL_OINTMENT | Freq: Two times a day (BID) | OPHTHALMIC | 1 refills | Status: AC
  Filled 2020-11-22: qty 3.5, 5d supply, fill #0

## 2020-11-22 NOTE — Assessment & Plan Note (Addendum)
Assessment: 59 year old female with left eyelid pain present for about 10 days.  She states she had this in the past and previous used erythromycin ointment which quickly resolved it.  She has continued to use warm compresses.  On physical exam she has good range of motion with her eyes with no pain with extraocular movement.  She has some conjunctival injection but no photophobia on physical exam.  She has some swelling to the upper and lower eyelid with mild erythema but it does not appear cellulitic on exam.  Overall differential can include blepharitis versus preseptal cellulitis.  Patient does have a picture of her eye from about 5 days ago and it looks to have improved significantly at this point. Plan: -Discussed patient with Dr. Jennette Kettle who also came to evaluate patient and assistance -We will prescribe erythromycin ointment per patient request for 5 days -Patient will follow up if her pain worsens or if it does not improve after 5 days. -Recommended continuing to use warm compresses and baby shampoo to keep the eyelids clean.

## 2020-11-23 ENCOUNTER — Other Ambulatory Visit (HOSPITAL_COMMUNITY): Payer: Self-pay

## 2020-12-03 ENCOUNTER — Encounter (HOSPITAL_COMMUNITY): Payer: Self-pay | Admitting: Emergency Medicine

## 2020-12-03 ENCOUNTER — Emergency Department (HOSPITAL_COMMUNITY): Payer: Self-pay

## 2020-12-03 ENCOUNTER — Emergency Department (HOSPITAL_COMMUNITY)
Admission: EM | Admit: 2020-12-03 | Discharge: 2020-12-03 | Discharge status: Against Medical Advice | Payer: Self-pay | Attending: Physician Assistant | Admitting: Physician Assistant

## 2020-12-03 ENCOUNTER — Other Ambulatory Visit: Payer: Self-pay

## 2020-12-03 DIAGNOSIS — W274XXA Contact with kitchen utensil, initial encounter: Secondary | ICD-10-CM | POA: Insufficient documentation

## 2020-12-03 DIAGNOSIS — Z5321 Procedure and treatment not carried out due to patient leaving prior to being seen by health care provider: Secondary | ICD-10-CM | POA: Insufficient documentation

## 2020-12-03 DIAGNOSIS — S61213A Laceration without foreign body of left middle finger without damage to nail, initial encounter: Secondary | ICD-10-CM | POA: Insufficient documentation

## 2020-12-03 DIAGNOSIS — Y92009 Unspecified place in unspecified non-institutional (private) residence as the place of occurrence of the external cause: Secondary | ICD-10-CM | POA: Insufficient documentation

## 2020-12-03 DIAGNOSIS — S61203A Unspecified open wound of left middle finger without damage to nail, initial encounter: Secondary | ICD-10-CM | POA: Diagnosis not present

## 2020-12-03 NOTE — ED Notes (Signed)
No answer for VS x3 

## 2020-12-03 NOTE — ED Triage Notes (Signed)
Pt states she cut the tip of L middle finger while cutting collards yesterday.  Coban dressing in place.  States she went to [facility] and sent to ED.  Still bleeding.

## 2020-12-03 NOTE — ED Triage Notes (Signed)
Emergency Medicine Provider Triage Evaluation Note  [name] , a 60 y.o. female  was evaluated in triage.  Pt complains of L middle finger skin avulsion that has continued to bleed since 2-3pm yesterday. Cut her finger while cutting greens at home. Feels lightheaded today. Has tried applying pressure. No numbness. No anticoagulant use.  Review of Systems  Positive: wound Negative: numbness  Physical Exam  BP (!) 155/108 (BP Location: Right Arm)   Pulse 92   Temp 98.7 F (37.1 C) (Oral)   Resp 20   SpO2 97%  Gen:   Awake, no distress   HEENT:  Atraumatic  Resp:  Normal effort  Cardiac:  Normal rate  Abd:   Nondistended, nontender  MSK:   Moves extremities without difficulty, avulsion to tip of finger with active oozing Neuro:  Speech clear   Medical Decision Making  Medically screening exam initiated at 5:39 PM.  Appropriate orders placed.  [name] was informed that the remainder of the evaluation will be completed by another provider, this initial triage assessment does not replace that evaluation, and the importance of remaining in the ED until their evaluation is complete.  Clinical Impression  Will order xray for evaluate for injury. Wound dressed at time and patient advised to apply pressure.   Anne Pates, PA-C 12/03/20 1741

## 2021-05-05 ENCOUNTER — Encounter (HOSPITAL_COMMUNITY): Payer: Self-pay | Admitting: Emergency Medicine

## 2021-05-05 ENCOUNTER — Other Ambulatory Visit: Payer: Self-pay | Admitting: Family Medicine

## 2021-05-05 ENCOUNTER — Ambulatory Visit (HOSPITAL_COMMUNITY)
Admission: EM | Admit: 2021-05-05 | Discharge: 2021-05-05 | Disposition: A | Discharge status: Home / Self Care | Payer: Self-pay | Attending: Emergency Medicine | Admitting: Emergency Medicine

## 2021-05-05 ENCOUNTER — Other Ambulatory Visit: Payer: Self-pay

## 2021-05-05 ENCOUNTER — Telehealth: Payer: Self-pay | Admitting: *Deleted

## 2021-05-05 ENCOUNTER — Other Ambulatory Visit (HOSPITAL_COMMUNITY): Payer: Self-pay

## 2021-05-05 DIAGNOSIS — J0140 Acute pansinusitis, unspecified: Secondary | ICD-10-CM

## 2021-05-05 DIAGNOSIS — R059 Cough, unspecified: Secondary | ICD-10-CM

## 2021-05-05 MED ORDER — LIDOCAINE VISCOUS HCL 2 % MT SOLN
OROMUCOSAL | Status: AC
  Filled 2021-05-05: qty 15

## 2021-05-05 MED ORDER — HYDROCOD POLST-CPM POLST ER 10-8 MG/5ML PO SUER
5.0000 mL | Freq: Two times a day (BID) | ORAL | 0 refills | Status: DC | PRN
  Filled 2021-05-05: qty 60, 6d supply, fill #0

## 2021-05-05 MED ORDER — ONDANSETRON 8 MG PO TBDP
8.0000 mg | ORAL_TABLET | Freq: Three times a day (TID) | ORAL | 0 refills | Status: DC | PRN
Start: 2021-05-05 — End: 2024-04-16
  Filled 2021-05-05: qty 20, 30d supply, fill #0

## 2021-05-05 MED ORDER — DOXYCYCLINE HYCLATE 100 MG PO CAPS
100.0000 mg | ORAL_CAPSULE | Freq: Two times a day (BID) | ORAL | 0 refills | Status: AC
Start: 2021-05-05 — End: 2021-05-15
  Filled 2021-05-05: qty 20, 10d supply, fill #0

## 2021-05-05 MED ORDER — BENZONATATE 200 MG PO CAPS
200.0000 mg | ORAL_CAPSULE | Freq: Three times a day (TID) | ORAL | 0 refills | Status: DC | PRN
Start: 2021-05-05 — End: 2022-10-31
  Filled 2021-05-05: qty 30, 10d supply, fill #0

## 2021-05-05 MED ORDER — LIDOCAINE VISCOUS HCL 2 % MT SOLN
15.0000 mL | Freq: Once | OROMUCOSAL | Status: AC
  Administered 2021-05-05: 15 mL via OROMUCOSAL

## 2021-05-05 NOTE — Telephone Encounter (Signed)
Patient called asking for an appt today due to her thinking her pneumonia is back.  She said that it usually comes once a year.  I advised her we were full until next week and that she should seek care at an urgent care or ED.  Patient voiced understanding but wanted me to check and see if PCP would be willing to call in her usual script of zithromax.  I informed that we usually don't prescribe abx without an appt but she would still like to ask.  Will forward to MD. Burnard Hawthorne

## 2021-05-05 NOTE — ED Provider Notes (Signed)
HPI  SUBJECTIVE:  [name] is a 60 y.o. female who presents with 1 week of malaise, cough productive of thick yellow phlegm, fevers T-max 102, body aches, nasal congestion, loss of sense of smell or taste, wheezing, nausea, vomiting, diarrhea.  Shortness of breath with coughing only.  She has sinus pain and pressure, no facial swelling, upper dental pain.  She reports chest soreness, sore throat and headache secondary to the cough.  No posttussive emesis.  She has also been taking Vicks VapoRub, Afrin.  The TheraFlu helps.  No aggravating factors.  No antibiotics in the past 3 months.  She took TheraFlu within 6 hours of evaluation. She had 2 negative home COVID tests.  She is unable to sleep at night secondary to the cough.  No known COVID exposure.  She got the COVID booster.  No GERD symptoms.  She has a past medical history of hypertension, GERD, pneumonia, sinusitis, COVID in 2020 and 2022.  No history of pulmonary disease, smoking, diabetes.  PMD: [facility].   Past Medical History:  Diagnosis Date   Arthritis    Fatty liver    GERD (gastroesophageal reflux disease)    Hypertension    PONV (postoperative nausea and vomiting)    with pain med   Sleep apnea    previously no longer neg sleep study   Thoracic nerve root impingement 08-05/2010   Severe right flank pain. Resolved with thoracic epidural steroid injection.    Umbilical hernia    UTI (lower urinary tract infection)    recent- unable to finish med only took 2 days    Past Surgical History:  Procedure Laterality Date   ABDOMINAL HYSTERECTOMY  2004   "partial"   BACK SURGERY  04,06   x2   BILATERAL OOPHORECTOMY  2010   2/2 malignancy   CARPAL TUNNEL RELEASE Bilateral ?1999   CESAREAN SECTION  1989   LAMINECTOMY AND MICRODISCECTOMY LUMBAR SPINE  2004; 2006   TOTAL KNEE ARTHROPLASTY Left 10/06/2013   Procedure: TOTAL KNEE ARTHROPLASTY;  Surgeon: Sheral Apley, MD;  Location: [facility] OR;  Service:  Orthopedics;  Laterality: Left;   TOTAL KNEE ARTHROPLASTY Right 11/09/2013   Procedure: RIGHT TOTAL KNEE ARTHROPLASTY;  Surgeon: Sheral Apley, MD;  Location: [facility] OR;  Service: Orthopedics;  Laterality: Right;    History reviewed. No pertinent family history.  Social History   Tobacco Use   Smoking status: Never   Smokeless tobacco: Never  Substance Use Topics   Alcohol use: No   Drug use: No     Current Facility-Administered Medications:    lidocaine (XYLOCAINE) 2 % viscous mouth solution 15 mL, 15 mL, Mouth/Throat, Once, Domenick Gong, MD  Current Outpatient Medications:    benzonatate (TESSALON) 200 MG capsule, Take 1 capsule (200 mg total) by mouth 3 (three) times daily as needed for cough., Disp: 30 capsule, Rfl: 0   chlorpheniramine-HYDROcodone (TUSSIONEX PENNKINETIC ER) 10-8 MG/5ML SUER, Take 5 mLs by mouth every 12 (twelve) hours as needed for cough., Disp: 60 mL, Rfl: 0   doxycycline (VIBRAMYCIN) 100 MG capsule, Take 1 capsule (100 mg total) by mouth 2 (two) times daily for 10 days., Disp: 20 capsule, Rfl: 0   ondansetron (ZOFRAN ODT) 8 MG disintegrating tablet, Take 1 tablet (8 mg total) by mouth every 8 (eight) hours as needed for nausea or vomiting., Disp: 20 tablet, Rfl: 0   atorvastatin (LIPITOR) 40 MG tablet, TAKE 1 TABLET (40 MG TOTAL) BY MOUTH DAILY., Disp: 30  tablet, Rfl: 3   fluticasone (FLONASE) 50 MCG/ACT nasal spray, Place 2 sprays into both nostrils daily., Disp: 16 g, Rfl: 6   gabapentin (NEURONTIN) 300 MG capsule, TAKE 1 CAPSULE BY MOUTH THREE TIMES DAILY., Disp: 90 capsule, Rfl: 0   lidocaine (LIDODERM) 5 %, Place 1 patch onto the skin daily. Remove & Discard patch within 12 hours or as directed by MD, Disp: 30 patch, Rfl: 0   lisinopril (ZESTRIL) 10 MG tablet, Take 1 tablet (10 mg total) by mouth daily., Disp: 90 tablet, Rfl: 0   metFORMIN (GLUCOPHAGE) 500 MG tablet, TAKE 1 TABLET BY MOUTH 2 TIMES DAILY WITH A MEAL., Disp: 60 tablet, Rfl: 3    methocarbamol (ROBAXIN) 500 MG tablet, Take 1 tablet (500 mg total) by mouth 2 (two) times daily., Disp: 20 tablet, Rfl: 0   nystatin (MYCOSTATIN/NYSTOP) powder, Apply topically 4 (four) times daily. (Patient taking differently: Apply topically 4 (four) times daily. AS DIRECTED- TO AFFECTED AREAS), Disp: 15 g, Rfl: 0   polyethylene glycol powder (GLYCOLAX/MIRALAX) powder, Take 17 g by mouth 2 (two) times daily as needed. (Patient taking differently: Take 17 g by mouth 2 (two) times daily as needed (for constipation). ), Disp: 3350 g, Rfl: 1  Allergies  Allergen Reactions   Allopurinol Other (See Comments)    Caused hair to fall out   Lubiprostone Nausea Only     Amitiza   Naproxen Hives   Other Nausea And Vomiting    This occurs with ANY ANESTHESIA or NARCOTICS   Oxycodone Nausea Only    "Can tolerate with Zofran"     ROS  As noted in HPI.   Physical Exam  BP (!) 158/97 (BP Location: Right Arm)   Pulse 100   Temp 99.3 F (37.4 C) (Oral)   Resp 20   SpO2 97%   Constitutional: Well developed, well nourished, coughing Eyes:  EOMI, conjunctiva normal bilaterally HENT: Normocephalic, atraumatic,mucus membranes moist.  Positive erythematous, swollen turbinates.  Positive nasal congestion.  Positive frontal sinus tenderness.  No maxillary sinus tenderness.  Normal oropharynx.  Extensive postnasal drip. Respiratory: Normal inspiratory effort.  Rhonchi base of the left lower lobe.  Positive chest wall tenderness. Cardiovascular: Borderline regular tachycardia GI: nondistended skin: No rash, skin intact Musculoskeletal: no deformities Neurologic: Alert & oriented x 3, no focal neuro deficits Psychiatric: Speech and behavior appropriate   ED Course   Medications  lidocaine (XYLOCAINE) 2 % viscous mouth solution 15 mL (has no administration in time range)    No orders of the defined types were placed in this encounter.   No results found for this or any previous visit (from  the past 24 hour(s)). No results found.  ED Clinical Impression  1. Cough   2. Acute non-recurrent pansinusitis      ED Assessment/Plan  Concern for pneumonia, With fevers, rhonchi and coughing.  She also has a sinusitis.  Symptoms have been present for a week.  Plan to send home with Zofran for her nausea, doxycycline which will cover sinusitis and pneumonia, Flonase, Tussionex and Tessalon.  Saline nasal irrigation. We discussed doing COVID testing, however she is no longer infectious as it has been a week and it would not change my management.  She declined COVID testing.  We also talked about doing a chest x-ray, but a negative film would not change my management, so deferring imaging today.    Patient has significant amount of coughing, she is complaining of sore throat.  Gave her  15 mL of viscous lidocaine to dampen cough, with improvement in her symptoms.    Follow-up with PMD in several days if not getting any better.  ER return precautions given.  [place] Narcotic database reviewed for this patient, and feel that the risk/benefit ratio today is favorable for proceeding with a prescription for controlled substance.No opiate rx since 11/21  Discussed MDM, treatment plan, and plan for follow-up with patient. Discussed sn/sx that should prompt return to the ED. patient agrees with plan.   Meds ordered this encounter  Medications   lidocaine (XYLOCAINE) 2 % viscous mouth solution 15 mL   chlorpheniramine-HYDROcodone (TUSSIONEX PENNKINETIC ER) 10-8 MG/5ML SUER    Sig: Take 5 mLs by mouth every 12 (twelve) hours as needed for cough.    Dispense:  60 mL    Refill:  0   benzonatate (TESSALON) 200 MG capsule    Sig: Take 1 capsule (200 mg total) by mouth 3 (three) times daily as needed for cough.    Dispense:  30 capsule    Refill:  0   doxycycline (VIBRAMYCIN) 100 MG capsule    Sig: Take 1 capsule (100 mg total) by mouth 2 (two) times daily for 10 days.    Dispense:  20 capsule     Refill:  0   ondansetron (ZOFRAN ODT) 8 MG disintegrating tablet    Sig: Take 1 tablet (8 mg total) by mouth every 8 (eight) hours as needed for nausea or vomiting.    Dispense:  20 tablet    Refill:  0      *This clinic note was created using Scientist, clinical (histocompatibility and immunogenetics). Therefore, there may be occasional mistakes despite careful proofreading.  ?    Domenick Gong, MD 05/06/21 [phone]

## 2021-05-05 NOTE — ED Triage Notes (Signed)
Pt reports cough with beige phlegm and SOB and nausea for week. Body aches. Had two negative covid test at home

## 2021-05-05 NOTE — Discharge Instructions (Addendum)
Finish the doxycycline, if you feel better.  This will cover both sinusitis and pneumonia.  Tussionex for the cough at night, Tessalon for the cough during the day.  Continue Flonase.  Start saline nasal irrigation with a [facility] Med rinse and distilled water as often as you want.  You can try some Mucinex to get the mucus thin so that you can get it out.

## 2021-05-07 ENCOUNTER — Encounter: Payer: Self-pay | Admitting: Family Medicine

## 2021-05-08 ENCOUNTER — Other Ambulatory Visit (HOSPITAL_COMMUNITY): Payer: Self-pay

## 2021-05-08 MED ORDER — GABAPENTIN 300 MG PO CAPS
300.0000 mg | ORAL_CAPSULE | Freq: Three times a day (TID) | ORAL | 0 refills | Status: DC
  Filled 2021-05-08: qty 90, 30d supply, fill #0

## 2021-05-16 ENCOUNTER — Other Ambulatory Visit (HOSPITAL_COMMUNITY): Payer: Self-pay
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
## 2021-10-13 ENCOUNTER — Other Ambulatory Visit (HOSPITAL_COMMUNITY): Payer: Self-pay

## 2021-10-13 ENCOUNTER — Ambulatory Visit (INDEPENDENT_AMBULATORY_CARE_PROVIDER_SITE_OTHER): Payer: Self-pay | Admitting: Family Medicine

## 2021-10-13 ENCOUNTER — Other Ambulatory Visit: Payer: Self-pay

## 2021-10-13 VITALS — BP 177/94 | HR 96 | Wt 282.0 lb

## 2021-10-13 DIAGNOSIS — R053 Chronic cough: Secondary | ICD-10-CM

## 2021-10-13 DIAGNOSIS — J0110 Acute frontal sinusitis, unspecified: Secondary | ICD-10-CM

## 2021-10-13 DIAGNOSIS — J302 Other seasonal allergic rhinitis: Secondary | ICD-10-CM

## 2021-10-13 MED ORDER — ALBUTEROL SULFATE HFA 108 (90 BASE) MCG/ACT IN AERS
2.0000 | INHALATION_SPRAY | Freq: Four times a day (QID) | RESPIRATORY_TRACT | 2 refills | Status: AC | PRN
  Filled 2021-10-13: qty 8.5, 25d supply, fill #0

## 2021-10-13 MED ORDER — AZITHROMYCIN 250 MG PO TABS
500.0000 mg | ORAL_TABLET | Freq: Every day | ORAL | 0 refills | Status: DC
  Filled 2021-10-13: qty 6, 5d supply, fill #0

## 2021-10-13 MED ORDER — FEXOFENADINE HCL 180 MG PO TABS
180.0000 mg | ORAL_TABLET | Freq: Every day | ORAL | 11 refills | Status: DC
  Filled 2021-10-13 – 2022-07-08 (×2): qty 30, 30d supply, fill #0

## 2021-10-13 MED ORDER — OPTICHAMBER DIAMOND MISC
1.0000 | Freq: Every day | 0 refills | Status: DC
  Filled 2021-10-13 – 2022-07-08 (×2): qty 1, 1d supply, fill #0

## 2021-10-13 MED ORDER — FLUTICASONE PROPIONATE 50 MCG/ACT NA SUSP
2.0000 | Freq: Every day | NASAL | 6 refills | Status: AC
  Filled 2021-10-13 – 2022-07-08 (×2): qty 16, 30d supply, fill #0

## 2021-10-13 NOTE — Assessment & Plan Note (Signed)
Patient has chronic cough that gets worse in the winter months. This has been present for many years on chart review. Will treat with albuterol given persistent symptoms. Discussed use and spacer. Recommend follow up in 1-2 weeks if no improvement. Pulmonary exam benign today, unlikely to be pneumonia. Could be resolving bronchitis. However, patient could have cough variant asthma or ILD. If no improvement, recommend PFTs, CXR.

## 2021-10-13 NOTE — Assessment & Plan Note (Signed)
6 weeks of cough with 2-3 weeks of purulent nasal drainage seen on exam with tenderness on frontal and maxillary sinuses. Will treat with zpak x5 days.

## 2021-10-13 NOTE — Assessment & Plan Note (Addendum)
Start flonase, Careers adviser. Recommend nasal saline rinse at night, humidifier.

## 2021-10-13 NOTE — Patient Instructions (Signed)
It was a pleasure to see you today!  You have a sinus infection today. Please take a zpak for the next 5 days.  For nasal congestion: use flonase once in each nostril daily. This works best when used daily for several weeks. Also rinse your nasal passages with sterile salt water (saline) before bed to help prevent cough. For shortness of breath, wheezing or cough, use the albuterol inhaler with the spacer up to every 2 hours.    Be Well,  Dr. Leary Roca

## 2021-10-13 NOTE — Progress Notes (Signed)
° ° °  SUBJECTIVE:   CHIEF COMPLAINT / HPI:   Sinusitis: Patient presents with 6 weeks of cough and complaining of 3+ weeks of nasal drainage that is purulent. She has had sinus pain in her fore head and on either side of her nose that is very uncomfortable. She denies fevers, chills, sore throat. She has a history of chronic cough, but it has been worse in the last few weeks and the constant cough is preventing her from working for [employer] as people complain about cough. Patient notes that he BP goes up when her cough is bad. She will start coughing and it will take her several seconds to recover and catch her breath. Last cxr in 2020, WNL.   HC maintenance: reminded patient to return for f/u to get mammogram, pap, colonoscopy, etc. She currently does not have insurance. Discussed orange card.   PERTINENT  PMH / PSH: chronic cough  OBJECTIVE:   BP (!) 177/94    Pulse 96    Wt 282 lb (127.9 kg)    SpO2 98%    BMI 44.17 kg/m   Nursing note and vitals reviewed GEN: age appropriate AAW, resting comfortably in chair, NAD, class III obesity HEENT: NCAT. PERRLA. Sclera without injection or icterus. MMM. Clear oropharynx. Nasal passages erythematous, edematous, with pustulant nasal drainage. TTP on frontal and maxillary sinuses.  Neck: Supple. No LAD Cardiac: Regular rate and rhythm. Normal S1/S2. No murmurs, rubs, or gallops appreciated. 2+ radial pulses. Lungs: Clear bilaterally to ascultation. No increased WOB, no accessory muscle usage. No w/r/r. Neuro: AOx3  Ext: no edema Psych: Pleasant and appropriate   ASSESSMENT/PLAN:   RHINITIS, ALLERGIC NOS Start flonase, allegra. Recommend nasal saline rinse at night, humidifier.  Sinusitis 6 weeks of cough with 2-3 weeks of purulent nasal drainage seen on exam with tenderness on frontal and maxillary sinuses. Will treat with zpak x5 days.  Chronic cough Patient has chronic cough that gets worse in the winter months. This has been present for  many years on chart review. Will treat with albuterol given persistent symptoms. Discussed use and spacer. Recommend follow up in 1-2 weeks if no improvement. Pulmonary exam benign today, unlikely to be pneumonia. Could be resolving bronchitis. However, patient could have cough variant asthma or ILD. If no improvement, recommend PFTs, CXR.     Shirlean Mylar, MD [facility]

## 2022-01-23 ENCOUNTER — Encounter: Payer: Self-pay | Admitting: *Deleted

## 2022-04-12 ENCOUNTER — Other Ambulatory Visit: Payer: Self-pay

## 2022-04-12 ENCOUNTER — Ambulatory Visit (INDEPENDENT_AMBULATORY_CARE_PROVIDER_SITE_OTHER): Payer: Managed Care, Other (non HMO) | Admitting: Family Medicine

## 2022-04-12 ENCOUNTER — Encounter: Payer: Self-pay | Admitting: Family Medicine

## 2022-04-12 ENCOUNTER — Other Ambulatory Visit (HOSPITAL_COMMUNITY): Payer: Self-pay

## 2022-04-12 VITALS — BP 157/80 | HR 80 | Wt 290.4 lb

## 2022-04-12 DIAGNOSIS — I10 Essential (primary) hypertension: Secondary | ICD-10-CM

## 2022-04-12 DIAGNOSIS — F4321 Adjustment disorder with depressed mood: Secondary | ICD-10-CM

## 2022-04-12 DIAGNOSIS — R5383 Other fatigue: Secondary | ICD-10-CM

## 2022-04-12 DIAGNOSIS — G8929 Other chronic pain: Secondary | ICD-10-CM

## 2022-04-12 DIAGNOSIS — R7303 Prediabetes: Secondary | ICD-10-CM

## 2022-04-12 DIAGNOSIS — M5442 Lumbago with sciatica, left side: Secondary | ICD-10-CM

## 2022-04-12 DIAGNOSIS — E1169 Type 2 diabetes mellitus with other specified complication: Secondary | ICD-10-CM

## 2022-04-12 DIAGNOSIS — M5441 Lumbago with sciatica, right side: Secondary | ICD-10-CM

## 2022-04-12 DIAGNOSIS — J309 Allergic rhinitis, unspecified: Secondary | ICD-10-CM

## 2022-04-12 DIAGNOSIS — Z1231 Encounter for screening mammogram for malignant neoplasm of breast: Secondary | ICD-10-CM

## 2022-04-12 DIAGNOSIS — G4733 Obstructive sleep apnea (adult) (pediatric): Secondary | ICD-10-CM

## 2022-04-12 DIAGNOSIS — E785 Hyperlipidemia, unspecified: Secondary | ICD-10-CM

## 2022-04-12 LAB — POCT GLYCOSYLATED HEMOGLOBIN (HGB A1C): HbA1c, POC (controlled diabetic range): 6.2 % (ref 0.0–7.0)

## 2022-04-12 MED ORDER — SEMAGLUTIDE (1 MG/DOSE) 4 MG/3ML ~~LOC~~ SOPN
0.2500 mg | PEN_INJECTOR | SUBCUTANEOUS | 0 refills | Status: DC
  Filled 2022-04-12: qty 3, 56d supply, fill #0

## 2022-04-12 NOTE — Assessment & Plan Note (Signed)
A1c today 6.2 unchanged from 1 year prior. Pt interested in weightloss and other measures. Was previously on 500 BID of metformin, has not been able to take for past year.  - Start simaglutide 0.25mg  weekly - Diabetics education referral to dietetics  - Start exercising (line dancing and chair yoga at the senior center)  - decrease soda consumption from one mountain dew nightly, replace with flavored water

## 2022-04-12 NOTE — Patient Instructions (Signed)
It was great to see you today! Thank you for choosing [facility] for your primary care. [name] was seen for physical.  Today we addressed: Prediabetes/Weight management: we discussed that you are still prediabetic. We will work on this by starting Ozempic. You are also going to be increasing your exercise by going line dancing and doing chair yoga. Also I have referred you to the dietician to discuss nutrition.  Feeling tired - I have gotten some labs to make sure there is not other reasons for your tiredness. However, you will also meet with the grief counselor through hospice. Also work on drinking enough water.  Back pain - look up sciatic pain videos on youtube. I will put some below. I will also refill your gabapentin.  I have ordered a screening mammogram. Please call this number to schedule it. You need a mammogram to prevent breast cancer.  Please schedule an appointment.  You can call [phone].  Blood pressure - I will restart your lisinopril. We will need to follow up your blood pressure.   If you haven't already, sign up for My Chart to have easy access to your labs results, and communication with your primary care physician.  We are checking some labs today. If they are abnormal, I will call you. If they are normal, I will send you a MyChart message (if it is active) or a letter in the mail. If you do not hear about your labs in the next 2 weeks, please call the office.   You should return to our clinic Return in about 2 weeks (around 04/26/2022) for BP check .  I recommend that you always bring your medications to each appointment as this makes it easy to ensure you are on the correct medications and helps [place] not miss refills when you need them.  Please arrive 15 minutes before your appointment to ensure smooth check in process.  We appreciate your efforts in making this happen.  Please call the clinic at [phone] if your symptoms worsen or you have any  concerns.  Thank you for allowing me to participate in your care, Lockie Mola, MD 04/12/2022, 4:39 PM PGY-1, [facility]

## 2022-04-12 NOTE — Assessment & Plan Note (Signed)
Chronic backpain, seemingly sciatic in nature given numbness and tingling down her legs and no focal tenderness in back.  - Counseled regarding nerve glide and other sciatic pain exercises  - Continue gabapentin  - hold off on use of previous muscle relaxer for now

## 2022-04-12 NOTE — Progress Notes (Signed)
    SUBJECTIVE:   CHIEF COMPLAINT / HPI:   Prediabetes A1c 1 year ago was 6.2. Pt says she thinks it has gotten worse. Drinks a mountain dew nightly. Drinks water 1-2 cups per day. No frequency, but does feel pressure. Has not been to opthalmologic in last 2 years.   Tiredness Feeling out of it. Lays down and cant go to sleep and cant sleep unless she takes tylenol pm. Has been feeling more depressed, especially since her mom passed away a couple of months ago. No bleeding. Used to go line dancing before mom got sick.   Back pain  Numbness and tingling down legs. When she sits up it hurts. Her legs go numb. When she bends her head down it worsens her back pain. Hurts when she pushes things.   PERTINENT  PMH / PSH: HLD, HTN, Obesity, OSA, ventral hernia   OBJECTIVE:   BP (!) 157/80   Pulse 80   Wt 290 lb 6.4 oz (131.7 kg)   SpO2 93%   BMI 45.48 kg/m   General: tearful woman  CV: RRR, difficult to fully auscultate secondary to body habitus Resp: CTAB, normal work of breathing  Abd: ventral hernia reducible, slightly tender to the left of umbilicus otherwise non tender abdomen, soft, non distended  Neuro - sensation intact bilaterally, no tenderness to palpation on lower back, pain in lower back with neck flexion and discomfort and tingling upon lifting legs while supine.  Ext 1+ pitting edema bilaterally in lower extremities   ASSESSMENT/PLAN:   HYPERTENSION, BENIGN SYSTEMIC BP upon rechecking today was higher at 175/77. Has not been able to take HTN meds for past year 2/2 social stressors. Taking care of mother and mother passing away.  - Restart previous prescription of 10 mg lisinopril  - F/u in 2 weeks for BP check   Allergic rhinitis Continue use of flonase daily until resolution of symptoms. Take allergra as well if necessary for short period.   Back pain Chronic backpain, seemingly sciatic in nature given numbness and tingling down her legs and no focal tenderness in  back.  - Counseled regarding nerve glide and other sciatic pain exercises  - Continue gabapentin  - hold off on use of previous muscle relaxer for now   Prediabetes A1c today 6.2 unchanged from 1 year prior. Pt interested in weightloss and other measures. Was previously on 500 BID of metformin, has not been able to take for past year.  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 She has been feeling depressed, has trouble with sleep, has not been cleaning her house, neglecting her slef care in terms of medical care and otherwise.  - Pt will contact grief counselor through hospice - Has support system of friends and children  - Will start going to senior center for line dancing, chair yoga   Low energy Pt reports low energy and fatigue. Has dealt with loss of family member recently. Patient attributes fatigue to this. Also reports loss of hair since event.  - CBC  - TSH  - F/u in 4 weeks     Lockie Mola, MD [facility]

## 2022-04-12 NOTE — Assessment & Plan Note (Signed)
Pt reports low energy and fatigue. Has dealt with loss of family member recently. Patient attributes fatigue to this. Also reports loss of hair since event.  - CBC  - TSH  - F/u in 4 weeks

## 2022-04-12 NOTE — Assessment & Plan Note (Signed)
BP upon rechecking today was higher at 175/77. Has not been able to take HTN meds for past year 2/2 social stressors. Taking care of mother and mother passing away.  - Restart previous prescription of 10 mg lisinopril  - F/u in 2 weeks for BP check

## 2022-04-12 NOTE — Assessment & Plan Note (Signed)
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 She has been feeling depressed, has trouble with sleep, has not been cleaning her house, neglecting her slef care in terms of medical care and otherwise.  - Pt will contact grief counselor through hospice - Has support system of friends and children  - Will start going to senior center for line dancing, chair yoga

## 2022-04-12 NOTE — Assessment & Plan Note (Signed)
Continue use of flonase daily until resolution of symptoms. Take allergra as well if necessary for short period.

## 2022-04-13 ENCOUNTER — Other Ambulatory Visit (HOSPITAL_COMMUNITY): Payer: Self-pay

## 2022-04-13 ENCOUNTER — Other Ambulatory Visit: Payer: Self-pay

## 2022-04-13 DIAGNOSIS — R7303 Prediabetes: Secondary | ICD-10-CM

## 2022-04-13 DIAGNOSIS — G4733 Obstructive sleep apnea (adult) (pediatric): Secondary | ICD-10-CM

## 2022-04-13 DIAGNOSIS — R053 Chronic cough: Secondary | ICD-10-CM

## 2022-04-13 LAB — COMPREHENSIVE METABOLIC PANEL
ALT: 18 IU/L (ref 0–32)
AST: 17 IU/L (ref 0–40)
Albumin/Globulin Ratio: 1.6 (ref 1.2–2.2)
Albumin: 4.3 g/dL (ref 3.9–4.9)
Alkaline Phosphatase: 88 IU/L (ref 44–121)
BUN/Creatinine Ratio: 15 (ref 12–28)
BUN: 13 mg/dL (ref 8–27)
Bilirubin Total: 0.3 mg/dL (ref 0.0–1.2)
CO2: 25 mmol/L (ref 20–29)
Calcium: 9.7 mg/dL (ref 8.7–10.3)
Chloride: 102 mmol/L (ref 96–106)
Creatinine, Ser: 0.86 mg/dL (ref 0.57–1.00)
Globulin, Total: 2.7 g/dL (ref 1.5–4.5)
Glucose: 93 mg/dL (ref 70–99)
Potassium: 4.7 mmol/L (ref 3.5–5.2)
Sodium: 140 mmol/L (ref 134–144)
Total Protein: 7 g/dL (ref 6.0–8.5)
eGFR: 76 mL/min/{1.73_m2} (ref >59)

## 2022-04-13 LAB — CBC
Hematocrit: 41.4 % (ref 34.0–46.6)
Hemoglobin: 14.2 g/dL (ref 11.1–15.9)
MCH: 31.8 pg (ref 26.6–33.0)
MCHC: 34.3 g/dL (ref 31.5–35.7)
MCV: 93 fL (ref 79–97)
Platelets: 209 10*3/uL (ref 150–450)
RBC: 4.47 x10E6/uL (ref 3.77–5.28)
RDW: 12.8 % (ref 11.7–15.4)
WBC: 5.6 10*3/uL (ref 3.4–10.8)

## 2022-04-13 LAB — LIPID PANEL
Chol/HDL Ratio: 5.1 ratio — ABNORMAL HIGH (ref 0.0–4.4)
Cholesterol, Total: 254 mg/dL — ABNORMAL HIGH (ref 100–199)
HDL: 50 mg/dL (ref >39)
LDL Chol Calc (NIH): 181 mg/dL — ABNORMAL HIGH (ref 0–99)
Triglycerides: 129 mg/dL (ref 0–149)
VLDL Cholesterol Cal: 23 mg/dL (ref 5–40)

## 2022-04-13 LAB — TSH RFX ON ABNORMAL TO FREE T4: TSH: 0.957 u[IU]/mL (ref 0.450–4.500)

## 2022-04-13 MED ORDER — LISINOPRIL 10 MG PO TABS
10.0000 mg | ORAL_TABLET | Freq: Every day | ORAL | 0 refills | Status: DC
  Filled 2022-04-13 – 2022-04-16 (×2): qty 90, 90d supply, fill #0

## 2022-04-13 MED ORDER — ATORVASTATIN CALCIUM 40 MG PO TABS
40.0000 mg | ORAL_TABLET | Freq: Every day | ORAL | 11 refills | Status: DC
  Filled 2022-04-13: qty 30, 30d supply, fill #0
  Filled 2022-07-08: qty 30, 30d supply, fill #1

## 2022-04-13 MED ORDER — SEMAGLUTIDE(0.25 OR 0.5MG/DOS) 2 MG/3ML ~~LOC~~ SOPN
0.2500 mg | PEN_INJECTOR | SUBCUTANEOUS | 3 refills | Status: DC
  Filled 2022-04-13: qty 3, 30d supply, fill #0
  Filled 2022-04-17 – 2022-08-19 (×5): qty 3, 28d supply, fill #0
  Filled 2022-09-28: qty 3, 56d supply, fill #0

## 2022-04-13 NOTE — Telephone Encounter (Signed)
Patient calls nurse line regarding issues with prescriptions. She is needing refills on atorvastatin and lisinopril.   The Ozempic prescription also needs to be updated with quantity of 3 ml.  Forwarding to PCP.   Veronda Prude, RN

## 2022-04-16 ENCOUNTER — Other Ambulatory Visit (HOSPITAL_COMMUNITY): Payer: Self-pay

## 2022-04-17 ENCOUNTER — Other Ambulatory Visit (HOSPITAL_COMMUNITY): Payer: Self-pay

## 2022-04-17 ENCOUNTER — Telehealth: Payer: Self-pay

## 2022-04-17 ENCOUNTER — Other Ambulatory Visit: Payer: Self-pay | Admitting: Family Medicine

## 2022-04-17 DIAGNOSIS — R7303 Prediabetes: Secondary | ICD-10-CM

## 2022-04-17 NOTE — Telephone Encounter (Signed)
A Prior Authorization was initiated for this patients OZEMPIC through CoverMyMeds.   Key: [id number]

## 2022-04-18 ENCOUNTER — Other Ambulatory Visit (HOSPITAL_COMMUNITY): Payer: Self-pay

## 2022-04-19 ENCOUNTER — Other Ambulatory Visit (HOSPITAL_COMMUNITY): Payer: Self-pay

## 2022-04-19 MED ORDER — METFORMIN HCL 500 MG PO TABS
500.0000 mg | ORAL_TABLET | Freq: Two times a day (BID) | ORAL | 3 refills | Status: DC
  Filled 2022-04-19: qty 60, 30d supply, fill #0
  Filled 2022-07-08: qty 60, 30d supply, fill #1

## 2022-04-19 NOTE — Telephone Encounter (Signed)
Prior Auth for patients medication OZEMPIC denied by [employer] via CoverMyMeds.   Reason: no indication patient has diabetes. [employer] does not cover bydureon, byetta, ozempic, or rybelsus.  CoverMyMeds Key: [id number]

## 2022-04-19 NOTE — Addendum Note (Signed)
Addended by: Lockie Mola on: 04/19/2022 09:56 AM   Modules accepted: Orders

## 2022-04-19 NOTE — Telephone Encounter (Signed)
Pt was called. I explained to her that the prior authorization for ozempic was denied and discussed the cost of GLP 1 medications without insurance. We agreed that at this time patient could restart metformin and we would revisit GLP1 in the future.

## 2022-04-20 ENCOUNTER — Ambulatory Visit: Payer: Managed Care, Other (non HMO) | Admitting: Dietician

## 2022-04-25 ENCOUNTER — Other Ambulatory Visit (HOSPITAL_COMMUNITY): Payer: Self-pay

## 2022-04-26 ENCOUNTER — Ambulatory Visit: Payer: Self-pay

## 2022-05-14 ENCOUNTER — Ambulatory Visit: Payer: Self-pay | Admitting: Family Medicine

## 2022-07-09 ENCOUNTER — Other Ambulatory Visit (HOSPITAL_COMMUNITY): Payer: Self-pay

## 2022-07-09 ENCOUNTER — Other Ambulatory Visit: Payer: Self-pay | Admitting: Family Medicine

## 2022-07-09 DIAGNOSIS — E1169 Type 2 diabetes mellitus with other specified complication: Secondary | ICD-10-CM

## 2022-07-09 DIAGNOSIS — R7303 Prediabetes: Secondary | ICD-10-CM

## 2022-07-09 MED ORDER — ATORVASTATIN CALCIUM 40 MG PO TABS
40.0000 mg | ORAL_TABLET | Freq: Every day | ORAL | 11 refills | Status: DC
  Filled 2022-07-09 – 2022-08-18 (×2): qty 90, 90d supply, fill #0

## 2022-07-09 MED ORDER — METFORMIN HCL 500 MG PO TABS
500.0000 mg | ORAL_TABLET | Freq: Two times a day (BID) | ORAL | 3 refills | Status: DC
  Filled 2022-07-09 – 2022-08-18 (×2): qty 180, 90d supply, fill #0

## 2022-07-10 ENCOUNTER — Other Ambulatory Visit (HOSPITAL_COMMUNITY): Payer: Self-pay

## 2022-07-25 ENCOUNTER — Other Ambulatory Visit (HOSPITAL_COMMUNITY): Payer: Self-pay

## 2022-08-18 ENCOUNTER — Other Ambulatory Visit: Payer: Self-pay

## 2022-08-18 ENCOUNTER — Other Ambulatory Visit: Payer: Self-pay | Admitting: Family Medicine

## 2022-08-20 ENCOUNTER — Other Ambulatory Visit: Payer: Self-pay

## 2022-08-21 ENCOUNTER — Other Ambulatory Visit: Payer: Self-pay

## 2022-08-22 ENCOUNTER — Other Ambulatory Visit (HOSPITAL_COMMUNITY): Payer: Self-pay

## 2022-08-22 MED ORDER — LISINOPRIL 10 MG PO TABS
10.0000 mg | ORAL_TABLET | Freq: Every day | ORAL | 0 refills | Status: DC
  Filled 2022-08-22: qty 90, 90d supply, fill #0

## 2022-08-23 ENCOUNTER — Other Ambulatory Visit (HOSPITAL_COMMUNITY): Payer: Self-pay

## 2022-08-23 ENCOUNTER — Telehealth: Payer: Self-pay

## 2022-08-23 NOTE — Telephone Encounter (Signed)
2ND ATTEMPT  A Prior Authorization was initiated for this patients OZEMPIC through CoverMyMeds.   Key: [facility]

## 2022-08-29 NOTE — Telephone Encounter (Signed)
Prior Auth for patients medication OZEMPIC denied by [employer] via CoverMyMeds.   Reason: NO INDICATION OF TYPE 2 DIABETES  CoverMyMeds Key: [facility]

## 2022-09-05 ENCOUNTER — Other Ambulatory Visit: Payer: Self-pay

## 2022-09-05 ENCOUNTER — Other Ambulatory Visit (HOSPITAL_COMMUNITY): Payer: Self-pay

## 2022-09-28 ENCOUNTER — Other Ambulatory Visit (HOSPITAL_COMMUNITY): Payer: Self-pay

## 2022-10-01 ENCOUNTER — Ambulatory Visit: Payer: Managed Care, Other (non HMO)

## 2022-10-03 ENCOUNTER — Other Ambulatory Visit (HOSPITAL_COMMUNITY): Payer: Self-pay

## 2022-10-03 ENCOUNTER — Telehealth: Payer: Self-pay

## 2022-10-03 DIAGNOSIS — G4733 Obstructive sleep apnea (adult) (pediatric): Secondary | ICD-10-CM

## 2022-10-03 NOTE — Telephone Encounter (Signed)
Patient calls nurse line in regards to [facility] prescription.   Patient advised the medication has been denied by her insurance x2 due to non diagnose of DM.  Patient reports "I really need to get this weight off for surgery." She is requesting we send in [facility].  Will forward to PCP.

## 2022-10-04 ENCOUNTER — Other Ambulatory Visit: Payer: Self-pay | Admitting: Family Medicine

## 2022-10-04 ENCOUNTER — Other Ambulatory Visit (HOSPITAL_COMMUNITY): Payer: Self-pay

## 2022-10-04 MED ORDER — GABAPENTIN 300 MG PO CAPS
300.0000 mg | ORAL_CAPSULE | Freq: Three times a day (TID) | ORAL | 0 refills | Status: DC
  Filled 2022-10-04: qty 90, 30d supply, fill #0

## 2022-10-04 MED ORDER — TIRZEPATIDE-WEIGHT MANAGEMENT 2.5 MG/0.5ML ~~LOC~~ SOAJ
2.5000 mg | SUBCUTANEOUS | 0 refills | Status: DC
  Filled 2022-10-04 – 2022-10-10 (×4): qty 2, 28d supply, fill #0

## 2022-10-04 NOTE — Telephone Encounter (Signed)
Patient has been updated on new medication to her pharmacy.

## 2022-10-05 ENCOUNTER — Other Ambulatory Visit (HOSPITAL_COMMUNITY): Payer: Self-pay

## 2022-10-08 ENCOUNTER — Other Ambulatory Visit (HOSPITAL_COMMUNITY): Payer: Self-pay

## 2022-10-09 ENCOUNTER — Other Ambulatory Visit (HOSPITAL_COMMUNITY): Payer: Self-pay

## 2022-10-09 ENCOUNTER — Telehealth: Payer: Self-pay

## 2022-10-09 NOTE — Telephone Encounter (Signed)
Rec'd fax from patients pharmacy for [facility] medication.   Attempted to initiate PA but insurance rejected - drug is not covered by plan.

## 2022-10-10 ENCOUNTER — Other Ambulatory Visit (HOSPITAL_COMMUNITY): Payer: Self-pay

## 2022-10-10 ENCOUNTER — Encounter: Payer: Self-pay | Admitting: Family Medicine

## 2022-10-10 NOTE — Telephone Encounter (Signed)
[name] is covered, however this is her copay/billing:

## 2022-10-31 ENCOUNTER — Other Ambulatory Visit (HOSPITAL_COMMUNITY): Payer: Self-pay

## 2022-10-31 ENCOUNTER — Ambulatory Visit (INDEPENDENT_AMBULATORY_CARE_PROVIDER_SITE_OTHER): Payer: Managed Care, Other (non HMO) | Admitting: Family Medicine

## 2022-10-31 VITALS — BP 124/68 | HR 77 | Wt 298.4 lb

## 2022-10-31 DIAGNOSIS — R7303 Prediabetes: Secondary | ICD-10-CM

## 2022-10-31 DIAGNOSIS — E119 Type 2 diabetes mellitus without complications: Secondary | ICD-10-CM

## 2022-10-31 DIAGNOSIS — Z0000 Encounter for general adult medical examination without abnormal findings: Secondary | ICD-10-CM | POA: Diagnosis not present

## 2022-10-31 LAB — POCT GLYCOSYLATED HEMOGLOBIN (HGB A1C): HbA1c, POC (controlled diabetic range): 6.2 % (ref 0.0–7.0)

## 2022-10-31 LAB — GLUCOSE, POCT (MANUAL RESULT ENTRY): POC Glucose: 120 mg/dl — AB (ref 70–99)

## 2022-10-31 MED ORDER — METFORMIN HCL ER 500 MG PO TB24
1000.0000 mg | ORAL_TABLET | Freq: Two times a day (BID) | ORAL | 3 refills | Status: DC
  Filled 2022-10-31: qty 120, 30d supply, fill #0

## 2022-10-31 NOTE — Patient Instructions (Addendum)
It was wonderful to see you today. Thank you for allowing me to be a part of your care. Below is a short summary of what we discussed at your visit today:  Concern for elevated blood sugar Today in office, your A1c is 6.2. This is in the pre-diabetic range.  I want you to follow up with your PCP to follow up on your cholesterol and talk about health maintenance items such as mammogram and colonoscopies.   Health Maintenance We like to think about ways to keep you healthy for years to come. Below are some interventions and screenings we can offer to keep you healthy: - Colonoscopy - Mammogram - PAP smear due March 2025 - flu vaccine - COVID booster - Shingles vaccine  Mammogram I have ordered your routine mammogram to screen for breast cancer. This will be at the [facility]. You will call them directly to make an appointment at your convenience. Information below.     Please bring all of your medications to every appointment!  If you have any questions or concerns, please do not hesitate to contact us via phone or MyChart message.   Ezequiel Essex, MD

## 2022-10-31 NOTE — Progress Notes (Cosign Needed Addendum)
    SUBJECTIVE:   CHIEF COMPLAINT / HPI:   Concern for diabetes [name] presents with concerns for diabetes, saying that she has been experiencing worsening blurry vision, polydipsia, and polyuria for the last couple of months.   She went to an outside clinic called [facility], was found to have A1c 6.6%, LPA 12.3 (normal), creatinine 1.17, eGFR 52. She has signed an ROI and they will fax over records.   Lab Results  Component Value Date   HGBA1C 6.2 10/31/2022   HGBA1C 6.2 04/12/2022   HGBA1C 6.2 (A) 10/04/2020   Lab Results  Component Value Date   LDLCALC 181 (H) 04/12/2022   CREATININE 0.86 04/12/2022   PERTINENT  PMH / PSH:  Patient Active Problem List   Diagnosis Date Noted   Grief 04/12/2022   Prediabetes 10/04/2020   Chronic bilateral low back pain without sciatica 06/01/2019   Ventral hernia 10/31/2015   Healthcare maintenance 10/31/2015   Arthritis of knee 10/06/2013   Low energy 10/27/2010   OBESITY, MORBID 09/26/2010   Back pain 05/14/2008   SLEEP APNEA, OBSTRUCTIVE, SEVERE 11/10/2006   Allergic rhinitis 10/29/2006   HYPERTENSION, BENIGN SYSTEMIC 10/17/2006    OBJECTIVE:   BP 124/68   Pulse 77   Wt 298 lb 6.4 oz (135.4 kg)   SpO2 95%   BMI 46.74 kg/m     PHQ-9:     10/31/2022    9:48 AM 04/12/2022    3:27 PM 10/13/2021   11:23 AM  Depression screen PHQ 2/9  Decreased Interest 0 0 1  Down, Depressed, Hopeless 1 1 1   PHQ - 2 Score 1 1 2   Altered sleeping 2 3 1   Tired, decreased energy 2 3 1   Change in appetite 2 3 1   Feeling bad or failure about yourself  1 1 1   Trouble concentrating 1 1 1   Moving slowly or fidgety/restless 0 1 0  Suicidal thoughts 0 0 0  PHQ-9 Score 9 13 7   Difficult doing work/chores Not difficult at all Somewhat difficult Somewhat difficult    Physical Exam General: Awake, alert, oriented Cardiovascular: Regular rate and rhythm, S1 and S2 present, no murmurs auscultated Respiratory: Lung fields clear to auscultation  bilaterally  ASSESSMENT/PLAN:   Prediabetes Patient with concerns of worsening blood sugar. Previously known to be pre-diabetic. Labs at an outside facility show A1c of 6.7%, indicating likely worsening. If she has a second A1c >6.5%, would diagnose with diabetes. Given stomach upset with metformin IR, will prescribe XR.      Anne Essex, MD [place]

## 2022-11-01 ENCOUNTER — Other Ambulatory Visit (HOSPITAL_COMMUNITY): Payer: Self-pay

## 2022-11-01 LAB — LDL CHOLESTEROL, DIRECT: LDL Direct: 162 mg/dL — ABNORMAL HIGH (ref 0–99)

## 2022-11-02 NOTE — Assessment & Plan Note (Addendum)
Patient with concerns of worsening blood sugar. Previously known to be pre-diabetic. Labs at an outside facility show A1c of 6.7%, indicating likely worsening. If she has a second A1c >6.5%, would diagnose with diabetes. Given stomach upset with metformin IR, will prescribe XR.

## 2022-11-05 ENCOUNTER — Other Ambulatory Visit (HOSPITAL_COMMUNITY): Payer: Self-pay

## 2022-11-05 ENCOUNTER — Telehealth: Payer: Self-pay | Admitting: Family Medicine

## 2022-11-05 DIAGNOSIS — E7800 Pure hypercholesterolemia, unspecified: Secondary | ICD-10-CM

## 2022-11-05 DIAGNOSIS — E1169 Type 2 diabetes mellitus with other specified complication: Secondary | ICD-10-CM

## 2022-11-05 MED ORDER — ATORVASTATIN CALCIUM 40 MG PO TABS: 80.0000 mg | ORAL_TABLET | Freq: Every day | ORAL | 11 refills | Status: DC

## 2022-11-05 NOTE — Telephone Encounter (Signed)
Called patient to discuss LDL. Confirmed patient with name and DOB.   LDL elevated, but improved from prior in August. LDL now 160s. She is prescribed atorvastatin 40 mg, but reports she has intermittent adherence and started back in earnest in January.   Recommend increasing atorvastatin to max dose of 80 mg daily.  Okay to double up her current 40 mg tablets.  Plan for LDL recheck in May, patient amenable.  Patient also inquires about the possibility of getting Contrave for weight loss.  I told her I would reach out to her PCP.  Ezequiel Essex, MD

## 2022-11-13 ENCOUNTER — Encounter: Payer: Self-pay | Admitting: Family Medicine

## 2022-11-23 ENCOUNTER — Ambulatory Visit (INDEPENDENT_AMBULATORY_CARE_PROVIDER_SITE_OTHER): Payer: Managed Care, Other (non HMO) | Admitting: Family Medicine

## 2022-11-23 ENCOUNTER — Other Ambulatory Visit: Payer: Self-pay

## 2022-11-23 ENCOUNTER — Encounter: Payer: Self-pay | Admitting: Family Medicine

## 2022-11-23 VITALS — BP 157/68 | HR 80 | Ht 67.0 in | Wt 294.5 lb

## 2022-11-23 DIAGNOSIS — M545 Low back pain, unspecified: Secondary | ICD-10-CM

## 2022-11-23 DIAGNOSIS — R110 Nausea: Secondary | ICD-10-CM

## 2022-11-23 DIAGNOSIS — G8929 Other chronic pain: Secondary | ICD-10-CM

## 2022-11-23 MED ORDER — BUPROPION HCL ER (XL) 150 MG PO TB24: ORAL_TABLET | ORAL | 0 refills | Status: DC

## 2022-11-23 MED ORDER — ONDANSETRON 4 MG PO TBDP: 4.0000 mg | ORAL_TABLET | Freq: Three times a day (TID) | ORAL | 0 refills | Status: DC | PRN

## 2022-11-23 MED ORDER — GABAPENTIN 300 MG PO CAPS: 300.0000 mg | ORAL_CAPSULE | Freq: Three times a day (TID) | ORAL | 0 refills | Status: DC

## 2022-11-23 MED ORDER — BACLOFEN 10 MG PO TABS: 10.0000 mg | ORAL_TABLET | Freq: Three times a day (TID) | ORAL | 1 refills | Status: DC

## 2022-11-23 NOTE — Progress Notes (Unsigned)
    SUBJECTIVE:   CHIEF COMPLAINT / HPI:   Shoulder pain   Back pain Same location Has gotten worse Can only stand up 35-40 minutes to pass meds at work Needs to sit a lot at work  Really bad sciatica  [facility] Needs back surgery - but ortho says cannot operate until she looses weight  Gabapentin Robaxin - n/v Tylenol #3 - nasty, n/v Does not want narcotics  Has not tried baclofen  Weight loss desired Perviously tried phentermine Hard to work out   [employer] sensation Last couple of months Coughing a lot Big bite - gest stuck, has to cough back up Fluids can do it too, but not as bad No unintentional weight loss No blood in vomit, phlegm, stool, urine  Has choked in the past Getting hiccups Not feeling bloated after eating   Lots of phlegm in morning  PERTINENT  PMH / PSH: ***  OBJECTIVE:   BP (!) 157/68   Pulse 80   Ht 5\' 7"  (1.702 m)   Wt 294 lb 8 oz (133.6 kg)   SpO2 99%   BMI 46.13 kg/m   ***  ASSESSMENT/PLAN:   No problem-specific Assessment & Plan notes found for this encounter.     Fayette Pho, MD [facility]

## 2022-11-23 NOTE — Patient Instructions (Addendum)
It was wonderful to see you today. Thank you for allowing me to be a part of your care. Below is a short summary of what we discussed at your visit today:  Back pain Continue gabapentin three times daily.  Start baclofen three times daily as needed for back spasm.   Go to physical therapy for exercises for this back pain. I recommend pool-based therapy. I have referred you to PT.   Pre-diabetes management You have been referred to the below program August 2023. Please call for an appointment.  [facility] at [facility] Address: [address], [place], [place] [address] Phone: [phone]  Weight loss We can try Wellbutrin. Cannot yet do Contrave until your blood pressure is under better control.  Start Wellbutrin 150 mg daily. If tolerated well, on day #7 increase to 300 mg daily.  Return in one month to check in on how you are doing with this.   Check into a medically supervised water therapy program. These can be found at [employer] or possibly at a [facility]. Please check around.   You can call [facility] directly for an appointment. They have diet plans you follow closely based on metabolic testing they conduct.  [facility] at [facility] Address: [address] [place], [place] [address] Phone: [phone]  Please bring all of your medications to every appointment!  If you have any questions or concerns, please do not hesitate to contact us via phone or MyChart message.   Fayette Pho, MD

## 2022-11-24 ENCOUNTER — Encounter: Payer: Self-pay | Admitting: Family Medicine

## 2022-11-24 NOTE — Assessment & Plan Note (Signed)
Progressive worsening. No loss of function or other red flags.  - continue gabapentin - try baclofen - rx zofran for possible med-related nausea - referral to PT - recommend aqua therapy to gain strength and minimize back pain

## 2022-11-24 NOTE — Assessment & Plan Note (Signed)
Weight loss desired. She is interested in [place], but her BP is currently uncontrolled. - Wellbutrin XR 150 mg daily > 300 mg daily - PT therapy for back pain - provided resources on pre-diabetes nutrition program and [place] - follow up once month for Wellbutrin tolerance and titration

## 2022-12-21 ENCOUNTER — Other Ambulatory Visit: Payer: Self-pay | Admitting: Family Medicine

## 2023-02-22 ENCOUNTER — Other Ambulatory Visit (HOSPITAL_COMMUNITY): Payer: Self-pay

## 2023-02-22 ENCOUNTER — Ambulatory Visit (INDEPENDENT_AMBULATORY_CARE_PROVIDER_SITE_OTHER): Payer: Managed Care, Other (non HMO) | Admitting: Student

## 2023-02-22 VITALS — BP 138/82 | HR 84 | Ht 67.0 in | Wt 295.0 lb

## 2023-02-22 DIAGNOSIS — G4733 Obstructive sleep apnea (adult) (pediatric): Secondary | ICD-10-CM

## 2023-02-22 DIAGNOSIS — I10 Essential (primary) hypertension: Secondary | ICD-10-CM

## 2023-02-22 DIAGNOSIS — R232 Flushing: Secondary | ICD-10-CM | POA: Diagnosis not present

## 2023-02-22 DIAGNOSIS — R7303 Prediabetes: Secondary | ICD-10-CM | POA: Diagnosis not present

## 2023-02-22 MED ORDER — VENLAFAXINE HCL ER 37.5 MG PO CP24
37.5000 mg | ORAL_CAPSULE | Freq: Every day | ORAL | 0 refills | Status: DC
  Filled 2023-02-22: qty 30, 30d supply, fill #0

## 2023-02-22 MED ORDER — LOSARTAN POTASSIUM 25 MG PO TABS
25.00 mg | ORAL_TABLET | Freq: Every day | ORAL | 3 refills | Status: AC
Start: 2023-02-22 — End: ?
  Filled 2023-02-22: qty 90, 90d supply, fill #0

## 2023-02-22 NOTE — Assessment & Plan Note (Addendum)
Likely needs another sleep study given it has been greater than 10 years since the last 1 and she does not have CPAP supplies or device any longer.  I have thoroughly discussed how this might play a long-term role in her health and her fatigue and blood pressure are not benefiting from having uncontrolled sleep apnea.  Follow-up with PCP.

## 2023-02-22 NOTE — Assessment & Plan Note (Signed)
Within appropriate parameters currently after discontinuing lisinopril related to cough.  Because she is being started on venlafaxine which has potential blood pressure effects, I have restarted losartan at lowest dose.  Return in 1 week for lab check.

## 2023-02-22 NOTE — Assessment & Plan Note (Signed)
Reviewed recent A1c in March 2024, 6.2 however this was on metformin 1000 mg twice daily.  This may be masking true diabetes diagnosis.  Defer follow-up to PCP.  She likely would benefit from GLP-1 medication such as Wegovy.

## 2023-02-22 NOTE — Patient Instructions (Addendum)
It was great to see you today! Thank you for choosing [facility] for your primary care.   Today we addressed: Depressive symptoms: I am going to start you on a medication that may assist for this.  I would like you to follow-up with your PCP to ensure this has been helpful. Sleep: It appears you were diagnosed with sleep apnea in the past.  We may need another sleep study since it has been perhaps greater than 10 years. Blood pressure: I am starting you on a low-dose medication that should not have the side effect of cough.  You will need to follow-up in 1 week for lab check (this is to check your response to the blood pressure medication after being on it for 1 week).  I would recommend establishing with your PCP for follow-up in 2 weeks.  If you haven't already, sign up for My Chart to have easy access to your labs results, and communication with your primary care physician.  I recommend that you always bring your medications to each appointment as this makes it easy to ensure you are on the correct medications and helps [place] not miss refills when you need them.  You should return to our clinic Return in about 2 weeks (around 03/08/2023) for Follow-up of hott flashes, weight loss with PCP [name]). Please arrive 15 minutes before your appointment to ensure smooth check in process.  We appreciate your efforts in making this happen.  Thank you for allowing me to participate in your care, Anne Mattocks, DO 02/22/2023, 10:21 AM PGY-3, [facility]

## 2023-02-22 NOTE — Progress Notes (Signed)
  SUBJECTIVE:   CHIEF COMPLAINT / HPI:   Menopause: for the last 6 months, she had hysterectomy (2004) and b/l oophorectomy (2010). She is getting hot flashes mostly at night. She gets up and drinks water and uses ice and a fan to help cool her down. She works third shift. She takes a couple tylenol, melatonin and gabapentin prior to bed in order to sleep well.   Obesity: she is concerned she has been gaining weight. She is wanting to discuss [facility] prescription for this.   Hypertension: She stopped taking her lisinopril a month ago because it caused coughing.  The coughing resolved after stopping lisinopril.  PERTINENT  PMH / PSH: HTN, hysterectomy with BSO, prediabetes, OSA, obesity  Patient Care Team: Lockie Mola, MD as PCP - General (Family Medicine) Linna Darner, RD as Dietitian (Family Medicine) OBJECTIVE:  BP 138/82   Pulse 84   Ht 5\' 7"  (1.702 m)   Wt 295 lb (133.8 kg)   BMI 46.20 kg/m  Physical Exam Constitutional:      General: She is not in acute distress.    Appearance: Normal appearance. She is not ill-appearing.  Cardiovascular:     Rate and Rhythm: Normal rate and regular rhythm.     Heart sounds: Normal heart sounds.  Pulmonary:     Breath sounds: Normal breath sounds.  Neurological:     Mental Status: She is alert.    ASSESSMENT/PLAN:  Hot flashes Assessment & Plan: Present for years although has bothered her much more in the last 6 months.  History of hysterectomy in 2004 and bilateral oophorectomy in 2010.  Start venlafaxine. Given potential blood pressure effects, I have also made blood pressure medication changes.  I will also check a TSH though do not suspect this to be the cause as this has been checked previously.  Orders: -     Venlafaxine HCl ER; Take 1 capsule (37.5 mg total) by mouth daily with breakfast.  Dispense: 30 capsule; Refill: 0 -     TSH Rfx on Abnormal to Free T4; Future  HYPERTENSION, BENIGN SYSTEMIC Assessment & Plan: Within  appropriate parameters currently after discontinuing lisinopril related to cough.  Because she is being started on venlafaxine which has potential blood pressure effects, I have restarted losartan at lowest dose.  Return in 1 week for lab check.  Orders: -     Basic metabolic panel; Future -     Losartan Potassium; Take 1 tablet (25 mg total) by mouth at bedtime.  Dispense: 90 tablet; Refill: 3  Prediabetes Assessment & Plan: Reviewed recent A1c in March 2024, 6.2 however this was on metformin 1000 mg twice daily.  This may be masking true diabetes diagnosis.  Defer follow-up to PCP.  She likely would benefit from GLP-1 medication such as Wegovy.  Orders: -     POCT glycosylated hemoglobin (Hb A1C); Future  SLEEP APNEA, OBSTRUCTIVE, SEVERE Assessment & Plan: Likely needs another sleep study given it has been greater than 10 years since the last 1 and she does not have CPAP supplies or device any longer.  I have thoroughly discussed how this might play a long-term role in her health and her fatigue and blood pressure are not benefiting from having uncontrolled sleep apnea.  Follow-up with PCP.   Return in about 2 weeks (around 03/08/2023) for Follow-up of hott flashes, weight loss with PCP [name]). Shelby Mattocks, DO 02/22/2023, 12:36 PM PGY-3, [facility]

## 2023-02-22 NOTE — Assessment & Plan Note (Signed)
Present for years although has bothered her much more in the last 6 months.  History of hysterectomy in 2004 and bilateral oophorectomy in 2010.  Start venlafaxine. Given potential blood pressure effects, I have also made blood pressure medication changes.  I will also check a TSH though do not suspect this to be the cause as this has been checked previously.

## 2023-02-25 ENCOUNTER — Telehealth: Payer: Self-pay

## 2023-02-25 ENCOUNTER — Other Ambulatory Visit (HOSPITAL_COMMUNITY): Payer: Self-pay

## 2023-02-25 DIAGNOSIS — I10 Essential (primary) hypertension: Secondary | ICD-10-CM

## 2023-02-25 DIAGNOSIS — R232 Flushing: Secondary | ICD-10-CM

## 2023-02-25 MED ORDER — VENLAFAXINE HCL ER 37.5 MG PO CP24
37.5000 mg | ORAL_CAPSULE | Freq: Every day | ORAL | 0 refills | Status: DC
Start: 2023-02-25 — End: 2023-03-27

## 2023-02-25 MED ORDER — LOSARTAN POTASSIUM 25 MG PO TABS
25.0000 mg | ORAL_TABLET | Freq: Every day | ORAL | 3 refills | Status: DC
Start: 2023-02-25 — End: 2023-08-23

## 2023-02-25 NOTE — Telephone Encounter (Signed)
Patient calls nurse line in regards to Effexor and Losartan.   She reports medications went to the wrong pharmacy.   She reports she does not use [facility] anymore. She requests we resend to [facility] on [address].   Medications have been resent.

## 2023-02-26 ENCOUNTER — Other Ambulatory Visit (HOSPITAL_COMMUNITY): Payer: Self-pay

## 2023-03-01 ENCOUNTER — Other Ambulatory Visit: Payer: Managed Care, Other (non HMO)

## 2023-03-01 DIAGNOSIS — R7303 Prediabetes: Secondary | ICD-10-CM | POA: Diagnosis not present

## 2023-03-01 DIAGNOSIS — R232 Flushing: Secondary | ICD-10-CM

## 2023-03-01 DIAGNOSIS — I10 Essential (primary) hypertension: Secondary | ICD-10-CM

## 2023-03-01 LAB — POCT GLYCOSYLATED HEMOGLOBIN (HGB A1C): HbA1c, POC (prediabetic range): 6.2 % (ref 5.7–6.4)

## 2023-03-02 LAB — BASIC METABOLIC PANEL
BUN/Creatinine Ratio: 14 (ref 12–28)
BUN: 14 mg/dL (ref 8–27)
CO2: 23 mmol/L (ref 20–29)
Calcium: 9.4 mg/dL (ref 8.7–10.3)
Chloride: 103 mmol/L (ref 96–106)
Creatinine, Ser: 0.97 mg/dL (ref 0.57–1.00)
Glucose: 95 mg/dL (ref 70–99)
Potassium: 4.5 mmol/L (ref 3.5–5.2)
Sodium: 141 mmol/L (ref 134–144)
eGFR: 65 mL/min/{1.73_m2} (ref >59)

## 2023-03-02 LAB — TSH RFX ON ABNORMAL TO FREE T4: TSH: 1.28 u[IU]/mL (ref 0.450–4.500)

## 2023-03-15 ENCOUNTER — Ambulatory Visit: Payer: Managed Care, Other (non HMO) | Admitting: Family Medicine

## 2023-03-27 ENCOUNTER — Other Ambulatory Visit: Payer: Self-pay | Admitting: Family Medicine

## 2023-03-27 DIAGNOSIS — R232 Flushing: Secondary | ICD-10-CM

## 2023-04-12 ENCOUNTER — Ambulatory Visit: Payer: Managed Care, Other (non HMO) | Admitting: Student

## 2023-04-12 ENCOUNTER — Other Ambulatory Visit: Payer: Self-pay

## 2023-04-12 ENCOUNTER — Ambulatory Visit: Payer: Managed Care, Other (non HMO)

## 2023-04-12 VITALS — BP 155/79 | HR 89 | Temp 98.1°F | Ht 67.0 in | Wt 298.2 lb

## 2023-04-12 DIAGNOSIS — I10 Essential (primary) hypertension: Secondary | ICD-10-CM | POA: Diagnosis not present

## 2023-04-12 DIAGNOSIS — J069 Acute upper respiratory infection, unspecified: Secondary | ICD-10-CM

## 2023-04-12 DIAGNOSIS — U071 COVID-19: Secondary | ICD-10-CM | POA: Diagnosis not present

## 2023-04-12 LAB — POC SOFIA SARS ANTIGEN FIA: SARS Coronavirus 2 Ag: POSITIVE — AB

## 2023-04-12 NOTE — Patient Instructions (Addendum)
It was great to see you! Thank you for allowing me to participate in your care!  I recommend that you always bring your medications to each appointment as this makes it easy to ensure you are on the correct medications and helps [place] not miss when refills are needed.  Our plans for today:  - your symptoms are consistent with an upper respiratory infection -You can take tylenol for pain and fever and continue to use flonase daily. Can also take mucinex for congestion.  - If symptoms don't improve within 1 week, return  - See attached information regarding supportive care and reasons to return or go to the ED (can't breath) -Take blood pressure medication when you get home and monitor blood pressure. If it is persistently >140/90 return.  - Make apt with PCP in near future to discuss health maintenance  -I will send letter if COVID test is negative and call if it is positive    Take care and seek immediate care sooner if you develop any concerns.   Dr. Erick Alley, DO [facility]

## 2023-04-12 NOTE — Progress Notes (Cosign Needed Addendum)
    SUBJECTIVE:   CHIEF COMPLAINT / HPI:   Sick symptoms Started about 36 hours ago including clear rhinorrhea, headache, decreased appetite, congestion, cough, sinus pressure. Temp of 99.8 this morning.  No vomiting or diarrhea. Denies body aches. No SOB, just can't breath through nose.  No known sick contacts.  Has been using Flonase and tried saline spray and nyquil.    HTN Did not take BP meds this morning.   PERTINENT  PMH / PSH: HTN, obesity   OBJECTIVE:   BP (!) 155/79   Pulse 89   Temp 98.1 F (36.7 C) (Oral)   Ht 5\' 7"  (1.702 m)   Wt 298 lb 3.2 oz (135.3 kg)   SpO2 96%   BMI 46.70 kg/m    General: NAD but seems anxious about being sick HEENT: White sclera, clear conjunctiva, MMM, no erythema or exudate of nasopharynx Cardiac: RRR, no murmurs. Respiratory: CTAB, normal effort, No wheezes, rales or rhonchi Skin: warm and dry Neuro: alert, no obvious focal deficits   ASSESSMENT/PLAN:   COVID-19 Patient is positive for COVID-19.  Exam and vitals are reassuring. -Handout on URIs provided -Discussed supportive care and return/ED precautions -Work note provided  HYPERTENSION, BENIGN SYSTEMIC  Patient advised take BP meds when she gets home. Can check BP at home when no longer sick and if can persistently elevated return for follow-up     Dr. Erick Alley, DO [facility]

## 2023-04-12 NOTE — Assessment & Plan Note (Signed)
Patient is positive for COVID-19.  Exam and vitals are reassuring. -Handout on URIs provided -Discussed supportive care and return/ED precautions -Work note provided

## 2023-04-12 NOTE — Assessment & Plan Note (Signed)
Patient advised take BP meds when she gets home. Can check BP at home when no longer sick and if can persistently elevated return for follow-up

## 2023-04-24 ENCOUNTER — Encounter: Payer: Self-pay | Admitting: Family Medicine

## 2023-04-26 ENCOUNTER — Other Ambulatory Visit: Payer: Self-pay | Admitting: Family Medicine

## 2023-04-26 DIAGNOSIS — Z1231 Encounter for screening mammogram for malignant neoplasm of breast: Secondary | ICD-10-CM

## 2023-05-06 ENCOUNTER — Ambulatory Visit (INDEPENDENT_AMBULATORY_CARE_PROVIDER_SITE_OTHER): Payer: Managed Care, Other (non HMO) | Admitting: Family Medicine

## 2023-05-06 VITALS — BP 150/87 | HR 74 | Ht 67.0 in | Wt 305.1 lb

## 2023-05-06 DIAGNOSIS — J329 Chronic sinusitis, unspecified: Secondary | ICD-10-CM

## 2023-05-06 DIAGNOSIS — G4733 Obstructive sleep apnea (adult) (pediatric): Secondary | ICD-10-CM

## 2023-05-06 MED ORDER — DOXYCYCLINE HYCLATE 100 MG PO TABS
100.0000 mg | ORAL_TABLET | Freq: Two times a day (BID) | ORAL | 0 refills | Status: AC
Start: 2023-05-06 — End: 2023-05-16

## 2023-05-06 NOTE — Assessment & Plan Note (Signed)
High risk STOP-BANG, patient does not currently use CPAP and would benefit from sleep study.  Placed order for this

## 2023-05-06 NOTE — Patient Instructions (Addendum)
Please take doxycycline twice a day for 10 days.  Please follow-up in 2 to 3 weeks if your symptoms do not improve.  Please start taking your blood pressure medicines again  I have placed a referral for sleep study, you should get a call about this soon

## 2023-05-06 NOTE — Progress Notes (Signed)
    SUBJECTIVE:   CHIEF COMPLAINT / HPI:   Upper respiratory symptoms x 3-4 weeks Positive for COVID-19 in late August 2024 Patient reports having nasal drainage, stuffy nose, coughing up phlegm (beige color worse in the mornings), and tinnitus.  Also with some sinus pain Has been trying Flonase without relief Denies fevers although she has not been checking her temperature regularly She has had a chronic cough for several months but her symptoms became much worse after testing positive for COVID  Also reports symptoms of sleep apnea, STOP-BANG greater than 6  PERTINENT  PMH / PSH:   OBJECTIVE:   BP (!) 150/87   Pulse 74   Ht 5\' 7"  (1.702 m)   Wt (!) 305 lb 2 oz (138.4 kg)   SpO2 99%   BMI 47.79 kg/m    General: NAD, pleasant, able to participate in exam HEENT: Mildly tender to palpation of maxillary sinuses bilaterally.  Posterior oropharynx erythematous without any lesions.  Unable to fully visualize tonsils.  No significant lymphadenopathy noted in neck.  Bilateral ears unremarkable any bulging or erythematous TMs or effusions Cardiac: RRR, no murmurs auscultated Respiratory: CTAB, normal WOB Abdomen: soft, non-tender, non-distended, normoactive bowel sounds Extremities: warm and well perfused Skin: warm and dry, no rashes noted Neuro: alert, no obvious focal deficits, speech normal  Psych: Normal affect and mood   ASSESSMENT/PLAN:   Assessment & Plan Sinusitis, unspecified chronicity, unspecified location Given duration of symptoms greater than 10 days with tenderness to palpation of bilateral sinuses, concern for bacterial sinusitis.  Will treat with doxycycline twice daily x 10 days.  Pulmonary exam unremarkable.  Given history of COVID we will have her follow-up in 2 to 3 weeks after completion of course of antibiotics, consider chest radiograph at that time if no symptom resolution. SLEEP APNEA, OBSTRUCTIVE, SEVERE High risk STOP-BANG, patient does not currently  use CPAP and would benefit from sleep study.  Placed order for this   Vonna Drafts, MD [facility]

## 2023-05-08 ENCOUNTER — Ambulatory Visit
Admission: RE | Admit: 2023-05-08 | Discharge: 2023-05-08 | Disposition: A | Discharge status: Home / Self Care | Payer: Managed Care, Other (non HMO) | Source: Ambulatory Visit

## 2023-05-08 DIAGNOSIS — Z1231 Encounter for screening mammogram for malignant neoplasm of breast: Secondary | ICD-10-CM

## 2023-05-09 ENCOUNTER — Encounter: Payer: Self-pay | Admitting: Family Medicine

## 2023-05-09 DIAGNOSIS — B3731 Acute candidiasis of vulva and vagina: Secondary | ICD-10-CM

## 2023-05-09 MED ORDER — FLUCONAZOLE 150 MG PO TABS
ORAL_TABLET | ORAL | 0 refills | Status: DC
Start: 2023-05-09 — End: 2023-08-23

## 2023-06-19 ENCOUNTER — Other Ambulatory Visit: Payer: Self-pay | Admitting: Family Medicine

## 2023-07-01 ENCOUNTER — Other Ambulatory Visit: Payer: Self-pay | Admitting: Family Medicine

## 2023-07-01 DIAGNOSIS — G4733 Obstructive sleep apnea (adult) (pediatric): Secondary | ICD-10-CM

## 2023-08-06 ENCOUNTER — Other Ambulatory Visit: Payer: Self-pay | Admitting: Family Medicine

## 2023-08-06 DIAGNOSIS — G8929 Other chronic pain: Secondary | ICD-10-CM

## 2023-08-22 ENCOUNTER — Ambulatory Visit: Payer: Managed Care, Other (non HMO) | Admitting: Student

## 2023-08-23 ENCOUNTER — Ambulatory Visit (INDEPENDENT_AMBULATORY_CARE_PROVIDER_SITE_OTHER): Payer: Managed Care, Other (non HMO) | Admitting: Family Medicine

## 2023-08-23 VITALS — BP 141/71 | HR 81 | Ht 66.0 in | Wt 294.6 lb

## 2023-08-23 DIAGNOSIS — E1169 Type 2 diabetes mellitus with other specified complication: Secondary | ICD-10-CM | POA: Diagnosis not present

## 2023-08-23 DIAGNOSIS — R32 Unspecified urinary incontinence: Secondary | ICD-10-CM | POA: Diagnosis not present

## 2023-08-23 DIAGNOSIS — E785 Hyperlipidemia, unspecified: Secondary | ICD-10-CM

## 2023-08-23 DIAGNOSIS — I10 Essential (primary) hypertension: Secondary | ICD-10-CM | POA: Diagnosis not present

## 2023-08-23 DIAGNOSIS — E119 Type 2 diabetes mellitus without complications: Secondary | ICD-10-CM | POA: Diagnosis not present

## 2023-08-23 DIAGNOSIS — G8929 Other chronic pain: Secondary | ICD-10-CM

## 2023-08-23 DIAGNOSIS — R232 Flushing: Secondary | ICD-10-CM

## 2023-08-23 DIAGNOSIS — M545 Low back pain, unspecified: Secondary | ICD-10-CM

## 2023-08-23 LAB — POCT URINALYSIS DIPSTICK
Bilirubin, UA: NEGATIVE
Blood, UA: NEGATIVE
Glucose, UA: NEGATIVE
Ketones, UA: NEGATIVE
Leukocytes, UA: NEGATIVE
Nitrite, UA: NEGATIVE
Protein, UA: NEGATIVE
Spec Grav, UA: 1.025 (ref 1.010–1.025)
Urobilinogen, UA: 1 U/dL
pH, UA: 5.5 (ref 5.0–8.0)

## 2023-08-23 MED ORDER — ATORVASTATIN CALCIUM 40 MG PO TABS: 80.0000 mg | ORAL_TABLET | Freq: Every day | ORAL | 1 refills | Status: DC

## 2023-08-23 MED ORDER — METFORMIN HCL ER 500 MG PO TB24: 1000.0000 mg | ORAL_TABLET | Freq: Two times a day (BID) | ORAL | 3 refills | Status: DC

## 2023-08-23 MED ORDER — BUPROPION HCL ER (XL) 150 MG PO TB24: 150.0000 mg | ORAL_TABLET | Freq: Two times a day (BID) | ORAL | 1 refills | Status: DC

## 2023-08-23 MED ORDER — VENLAFAXINE HCL ER 37.5 MG PO CP24: 37.5000 mg | ORAL_CAPSULE | Freq: Every day | ORAL | 1 refills | Status: DC

## 2023-08-23 MED ORDER — LOSARTAN POTASSIUM 25 MG PO TABS: 25.0000 mg | ORAL_TABLET | Freq: Every day | ORAL | 3 refills | Status: DC

## 2023-08-23 MED ORDER — GABAPENTIN 300 MG PO CAPS: 300.0000 mg | ORAL_CAPSULE | Freq: Three times a day (TID) | ORAL | 1 refills | Status: DC

## 2023-08-23 MED ORDER — MIRABEGRON ER 25 MG PO TB24: 25.0000 mg | ORAL_TABLET | Freq: Every day | ORAL | 1 refills | Status: DC

## 2023-08-23 NOTE — Patient Instructions (Addendum)
 Good to see you today - Thank you for coming in  Things we discussed today:  1) For your urinary issues, you most likely have a combination of weak pelvic floor muscles and overactive bladder muscles.  This makes sense so that your bladder will squeeze and it is easy for urine to leak out. -Start taking Myrbetriq  once a day.  This is to help prevent bladder overactivity. -Tried to go to the bathroom regularly, even if you do not have the sensation to go.  This can help make sure that your bladder is regularly emptied, and prevent leakage. -Continue to do Kegel exercises  2) We will restart your prior medications.  Please always bring your medication bottles  Come back to see me in 2 weeks

## 2023-08-23 NOTE — Assessment & Plan Note (Signed)
Restarted losartan.

## 2023-08-23 NOTE — Progress Notes (Signed)
    SUBJECTIVE:   CHIEF COMPLAINT / HPI:   PAH is a 63 year old woman with history of hysterectomy, ventral hernia, prediabetes that presents for urinary incontinence. - For past 2 months, has been having increasing urinary frequency and tingling sensation in her groin - Reports having an occasionally tingling and tensing sensation in her groin, like I have to go pee. - Has tries to do kegels on the toilet to try to get more urine out or try to readjust her muscles sensations - has been using panty liners - Reports fevers last week, but she thinks she had the flu back then and congestion, this is improving now.  - Does not necessarily think she's drinking more or having increased thirst.  - Feels like she sometimes has issues making it to the bathroom in time.  She will have sudden urges to go and then have urinary incontinence. - Sneezing and coughing makes urine leak out as well.   - Had hysterectomy around 2006, ovaries were removed at that time.  - Still gets possible symptoms such as hot flashes, irritability.   Prediabetes - reports that she stopped taking metformin  because she was overwhelmed with taking care of her daughter and having to work so much.    Meds nonadherence - reports that she has not been taking any of her meds due to beuing out and then not being able to take care of herself - Has been off for 2 months now   OBJECTIVE:   BP (!) 141/71   Pulse 81   Ht 5' 6 (1.676 m)   Wt 294 lb 9.6 oz (133.6 kg)   SpO2 99%   BMI 47.55 kg/m   General: Alert, pleasant woman. NAD. HEENT: NCAT. MMM. CV: RRR, no murmurs.  Resp: CTAB, no wheezing or crackles. Normal WOB on RA.  Abm: Soft, nontender, nondistended. BS present. Ext: Moves all ext spontaneously Skin: Warm, well perfused    ASSESSMENT/PLAN:   Assessment & Plan Urinary incontinence, unspecified type Differential includes stress, urge, prediabetes. Most likely a combination of stress (post  menopausal) and urge (sudden sensations of needing to go and then urinary incontinence). UA unremarklable for infection.  - Recommend doing Kegels for 3 minutes 3 times a day.  Also recommended doing more if she has extra time.  Discussed that it could take time for Kegels to start working. -Start mirabegron  5 mg daily -Advised to go to bathroom at regular intervals, even when she does not have to go. Type 2 diabetes mellitus without complication, without long-term current use of insulin (HCC) Restarted metformin  Hyperlipidemia associated with type 2 diabetes mellitus (HCC) Restarted atorvastatin  HYPERTENSION, BENIGN SYSTEMIC Restarted losartan    Med Nonadherence -Has been off medications due to stressors in life, patient wants to restart medications and focus on her health again. -Will restart metformin , atorvastatin , losartan , venlafaxine , Wellbutrin , gabapentin   Follow-up in 2 weeks - Recheck BP, BMP, lipid, A1c - Reassess urinary incontinence    Twyla Nearing, MD [facility]

## 2023-08-24 LAB — MICROALBUMIN / CREATININE URINE RATIO
Creatinine, Urine: 171.6 mg/dL
Microalb/Creat Ratio: 2 mg/g{creat} (ref 0–29)
Microalbumin, Urine: 4.1 ug/mL

## 2023-09-06 ENCOUNTER — Other Ambulatory Visit: Payer: Self-pay | Admitting: Family Medicine

## 2023-09-06 ENCOUNTER — Ambulatory Visit: Payer: Managed Care, Other (non HMO) | Admitting: Family Medicine

## 2023-09-06 DIAGNOSIS — E1169 Type 2 diabetes mellitus with other specified complication: Secondary | ICD-10-CM

## 2023-09-17 ENCOUNTER — Other Ambulatory Visit: Payer: Self-pay | Admitting: Family Medicine

## 2023-09-17 DIAGNOSIS — R232 Flushing: Secondary | ICD-10-CM

## 2024-02-06 ENCOUNTER — Other Ambulatory Visit: Payer: Self-pay | Admitting: Family Medicine

## 2024-02-06 DIAGNOSIS — E119 Type 2 diabetes mellitus without complications: Secondary | ICD-10-CM

## 2024-04-14 ENCOUNTER — Encounter (HOSPITAL_BASED_OUTPATIENT_CLINIC_OR_DEPARTMENT_OTHER): Payer: Self-pay | Admitting: Emergency Medicine

## 2024-04-14 ENCOUNTER — Other Ambulatory Visit: Payer: Self-pay

## 2024-04-14 ENCOUNTER — Observation Stay (HOSPITAL_BASED_OUTPATIENT_CLINIC_OR_DEPARTMENT_OTHER)
Admission: EM | Admit: 2024-04-14 | Discharge: 2024-04-16 | Disposition: A | Discharge status: Home / Self Care | Attending: Family Medicine | Admitting: Family Medicine

## 2024-04-14 ENCOUNTER — Emergency Department (HOSPITAL_BASED_OUTPATIENT_CLINIC_OR_DEPARTMENT_OTHER)

## 2024-04-14 DIAGNOSIS — G8929 Other chronic pain: Secondary | ICD-10-CM | POA: Insufficient documentation

## 2024-04-14 DIAGNOSIS — I10 Essential (primary) hypertension: Secondary | ICD-10-CM | POA: Insufficient documentation

## 2024-04-14 DIAGNOSIS — Z96653 Presence of artificial knee joint, bilateral: Secondary | ICD-10-CM | POA: Diagnosis not present

## 2024-04-14 DIAGNOSIS — K046 Periapical abscess with sinus: Secondary | ICD-10-CM | POA: Diagnosis not present

## 2024-04-14 DIAGNOSIS — K047 Periapical abscess without sinus: Secondary | ICD-10-CM | POA: Diagnosis not present

## 2024-04-14 DIAGNOSIS — R220 Localized swelling, mass and lump, head: Secondary | ICD-10-CM | POA: Diagnosis present

## 2024-04-14 DIAGNOSIS — Z79899 Other long term (current) drug therapy: Secondary | ICD-10-CM | POA: Insufficient documentation

## 2024-04-14 DIAGNOSIS — L03211 Cellulitis of face: Secondary | ICD-10-CM

## 2024-04-14 DIAGNOSIS — K089 Disorder of teeth and supporting structures, unspecified: Secondary | ICD-10-CM | POA: Diagnosis not present

## 2024-04-14 DIAGNOSIS — Z789 Other specified health status: Secondary | ICD-10-CM

## 2024-04-14 LAB — CBC WITH DIFFERENTIAL/PLATELET
Abs Immature Granulocytes: 0.01 K/uL (ref 0.00–0.07)
Basophils Absolute: 0.1 K/uL (ref 0.0–0.1)
Basophils Relative: 1 %
Eosinophils Absolute: 0 K/uL (ref 0.0–0.5)
Eosinophils Relative: 1 %
HCT: 40.9 % (ref 36.0–46.0)
Hemoglobin: 13.7 g/dL (ref 12.0–15.0)
Immature Granulocytes: 0 %
Lymphocytes Relative: 27 %
Lymphs Abs: 2.2 K/uL (ref 0.7–4.0)
MCH: 31.7 pg (ref 26.0–34.0)
MCHC: 33.5 g/dL (ref 30.0–36.0)
MCV: 94.7 fL (ref 80.0–100.0)
Monocytes Absolute: 0.7 K/uL (ref 0.1–1.0)
Monocytes Relative: 9 %
Neutro Abs: 5 K/uL (ref 1.7–7.7)
Neutrophils Relative %: 62 %
Platelets: 197 K/uL (ref 150–400)
RBC: 4.32 MIL/uL (ref 3.87–5.11)
RDW: 12.9 % (ref 11.5–15.5)
WBC: 8 K/uL (ref 4.0–10.5)
nRBC: 0 % (ref 0.0–0.2)

## 2024-04-14 LAB — COMPREHENSIVE METABOLIC PANEL WITH GFR
ALT: 16 U/L (ref 0–44)
AST: 16 U/L (ref 15–41)
Albumin: 4.2 g/dL (ref 3.5–5.0)
Alkaline Phosphatase: 76 U/L (ref 38–126)
Anion gap: 11 (ref 5–15)
BUN: 9 mg/dL (ref 8–23)
CO2: 22 mmol/L (ref 22–32)
Calcium: 9.2 mg/dL (ref 8.9–10.3)
Chloride: 104 mmol/L (ref 98–111)
Creatinine, Ser: 0.88 mg/dL (ref 0.44–1.00)
GFR, Estimated: >60 mL/min (ref >60)
Glucose, Bld: 105 mg/dL — ABNORMAL HIGH (ref 70–99)
Potassium: 3.8 mmol/L (ref 3.5–5.1)
Sodium: 138 mmol/L (ref 135–145)
Total Bilirubin: 0.8 mg/dL (ref 0.0–1.2)
Total Protein: 7 g/dL (ref 6.5–8.1)

## 2024-04-14 LAB — LACTIC ACID, PLASMA
Lactic Acid, Venous: 0.9 mmol/L (ref 0.5–1.9)
Lactic Acid, Venous: 0.9 mmol/L (ref 0.5–1.9)

## 2024-04-14 MED ORDER — CHLORHEXIDINE GLUCONATE 0.12 % MT SOLN
15.0000 mL | Freq: Two times a day (BID) | OROMUCOSAL | Status: DC
  Administered 2024-04-14 – 2024-04-16 (×4): 15 mL via OROMUCOSAL
  Filled 2024-04-14 (×5): qty 15

## 2024-04-14 MED ORDER — ONDANSETRON 4 MG PO TBDP
4.0000 mg | ORAL_TABLET | Freq: Three times a day (TID) | ORAL | Status: DC | PRN
  Administered 2024-04-14 – 2024-04-16 (×6): 4 mg via ORAL
  Filled 2024-04-14 (×6): qty 1

## 2024-04-14 MED ORDER — ENOXAPARIN SODIUM 60 MG/0.6ML IJ SOSY
60.0000 mg | PREFILLED_SYRINGE | INTRAMUSCULAR | Status: DC
  Administered 2024-04-14 – 2024-04-15 (×2): 60 mg via SUBCUTANEOUS
  Filled 2024-04-14 (×4): qty 0.6

## 2024-04-14 MED ORDER — GABAPENTIN 300 MG PO CAPS
300.0000 mg | ORAL_CAPSULE | Freq: Three times a day (TID) | ORAL | Status: DC
Start: 2024-04-15 — End: 2024-04-16
  Administered 2024-04-15 – 2024-04-16 (×4): 300 mg via ORAL
  Filled 2024-04-14 (×4): qty 1

## 2024-04-14 MED ORDER — OXYCODONE HCL 5 MG PO TABS
5.0000 mg | ORAL_TABLET | ORAL | Status: DC | PRN
  Administered 2024-04-14 – 2024-04-16 (×6): 5 mg via ORAL
  Filled 2024-04-14 (×6): qty 1

## 2024-04-14 MED ORDER — IOHEXOL 300 MG/ML  SOLN
100.0000 mL | Freq: Once | INTRAMUSCULAR | Status: AC | PRN
  Administered 2024-04-14: 75 mL via INTRAVENOUS

## 2024-04-14 MED ORDER — SODIUM CHLORIDE 0.9 % IV BOLUS
1000.0000 mL | Freq: Once | INTRAVENOUS | Status: AC
  Administered 2024-04-14: 1000 mL via INTRAVENOUS

## 2024-04-14 MED ORDER — DEXAMETHASONE SODIUM PHOSPHATE 10 MG/ML IJ SOLN
10.0000 mg | Freq: Once | INTRAMUSCULAR | Status: AC
  Administered 2024-04-14: 10 mg via INTRAVENOUS
  Filled 2024-04-14: qty 1

## 2024-04-14 MED ORDER — MORPHINE SULFATE (PF) 4 MG/ML IV SOLN
4.0000 mg | Freq: Once | INTRAVENOUS | Status: AC
  Administered 2024-04-14: 4 mg via INTRAVENOUS
  Filled 2024-04-14: qty 1

## 2024-04-14 MED ORDER — ACETAMINOPHEN 500 MG PO TABS: 1000.0000 mg | ORAL_TABLET | ORAL | Status: DC | PRN

## 2024-04-14 MED ORDER — ACETAMINOPHEN 500 MG PO TABS
1000.0000 mg | ORAL_TABLET | Freq: Four times a day (QID) | ORAL | Status: DC | PRN
  Administered 2024-04-14 – 2024-04-16 (×3): 1000 mg via ORAL
  Filled 2024-04-14 (×3): qty 2

## 2024-04-14 MED ORDER — ONDANSETRON HCL 4 MG/2ML IJ SOLN
4.0000 mg | Freq: Once | INTRAMUSCULAR | Status: AC
  Administered 2024-04-14: 4 mg via INTRAVENOUS
  Filled 2024-04-14: qty 2

## 2024-04-14 MED ORDER — MELATONIN 5 MG PO TABS
5.0000 mg | ORAL_TABLET | Freq: Every day | ORAL | Status: DC
  Administered 2024-04-14 – 2024-04-15 (×2): 5 mg via ORAL
  Filled 2024-04-14 (×2): qty 1

## 2024-04-14 MED ORDER — GABAPENTIN 300 MG PO CAPS
300.0000 mg | ORAL_CAPSULE | Freq: Once | ORAL | Status: AC
  Administered 2024-04-14: 300 mg via ORAL
  Filled 2024-04-14: qty 1

## 2024-04-14 MED ORDER — LOSARTAN POTASSIUM 50 MG PO TABS
25.0000 mg | ORAL_TABLET | Freq: Every day | ORAL | Status: DC
  Administered 2024-04-15: 25 mg via ORAL
  Filled 2024-04-14: qty 1

## 2024-04-14 MED ORDER — VANCOMYCIN HCL IN DEXTROSE 1-5 GM/200ML-% IV SOLN
1000.0000 mg | Freq: Once | INTRAVENOUS | Status: AC
  Administered 2024-04-14: 1000 mg via INTRAVENOUS
  Filled 2024-04-14: qty 200

## 2024-04-14 MED ORDER — PHENOL 1.4 % MT LIQD
2.0000 | OROMUCOSAL | Status: DC | PRN
  Administered 2024-04-14: 2 via OROMUCOSAL
  Filled 2024-04-14: qty 177

## 2024-04-14 MED ORDER — SODIUM CHLORIDE 0.9 % IV SOLN
3.0000 g | Freq: Once | INTRAVENOUS | Status: AC
  Administered 2024-04-14: 3 g via INTRAVENOUS

## 2024-04-14 MED ORDER — VANCOMYCIN HCL 1750 MG/350ML IV SOLN
1750.0000 mg | INTRAVENOUS | Status: DC
  Administered 2024-04-14 – 2024-04-15 (×2): 1750 mg via INTRAVENOUS
  Filled 2024-04-14 (×3): qty 350

## 2024-04-14 MED ORDER — ATORVASTATIN CALCIUM 80 MG PO TABS
80.0000 mg | ORAL_TABLET | Freq: Every day | ORAL | Status: DC
  Administered 2024-04-15 – 2024-04-16 (×2): 80 mg via ORAL
  Filled 2024-04-14 (×2): qty 1

## 2024-04-14 MED ORDER — BENZOCAINE 10 % MT GEL
Freq: Two times a day (BID) | OROMUCOSAL | Status: DC | PRN
  Filled 2024-04-14: qty 9

## 2024-04-14 MED ORDER — BUPROPION HCL ER (XL) 150 MG PO TB24
150.0000 mg | ORAL_TABLET | Freq: Two times a day (BID) | ORAL | Status: DC
  Administered 2024-04-15 – 2024-04-16 (×3): 150 mg via ORAL
  Filled 2024-04-14 (×4): qty 1

## 2024-04-14 MED ORDER — VENLAFAXINE HCL ER 37.5 MG PO CP24
37.5000 mg | ORAL_CAPSULE | Freq: Every day | ORAL | Status: DC
  Administered 2024-04-15 – 2024-04-16 (×2): 37.5 mg via ORAL
  Filled 2024-04-14 (×2): qty 1

## 2024-04-14 MED ORDER — SODIUM CHLORIDE 0.9 % IV SOLN
1.5000 g | Freq: Three times a day (TID) | INTRAVENOUS | Status: DC
  Administered 2024-04-14 – 2024-04-16 (×6): 1.5 g via INTRAVENOUS
  Filled 2024-04-14 (×8): qty 4

## 2024-04-14 NOTE — Assessment & Plan Note (Addendum)
 Admission from [facility] standalone ED for dental abscess. Seen by dentist and rx amoxicillin  which she took 3 doses of before coming to ED for evaluation.  - Admit to MedSurg under Dr. Anders for obs - Unasyn  1.5g IV q8h (8/26-)  - IV vancomycin  (8/26-) - Zofran  ODT for nausea with administration of meds - Pain regimen: Tylenol  1000mg  q6h PRN, oxycodone  5mg  q4h PRN and PRN chloraseptic spray - Plan for OP dentistry once pain improves - ENT drained abscess at bedside, recommended continuing IV Vanco/Unasyn .  Additionally recommended chlorhexidine  mouthwash.

## 2024-04-14 NOTE — Consult Note (Addendum)
 ENT CONSULT:  Reason for Consult: Right maxillary odontogenic abscess  Referring Physician: ED/Medicine team  HPI: [name] is an 63 y.o. female with right facial swelling and pain who ENT was consulted for management help. Reports her right maxillary teeth were hurting for the past few days, went to dentist on Monday, who reported she needed a root canal and placed on amoxicillin . Had significant facial swelling starting yesterday which worsened and presented to ED where a CT was done showing periapical lucency right maxillary molar with associated abscess and facial cellulitis.  She was placed on IV antibiotics. She reports she is doing much better with facial swelling improving since admission. She is tolerating soft foods. No prior dental abscess.   Past Medical History:  Diagnosis Date   Acute pain of right shoulder 06/01/2019   Arthritis    Blepharitis 11/22/2020   Blurry vision, left eye 04/07/2015   Chronic bilateral low back pain without sciatica 06/01/2019   Chronic cough 12/02/2018   Fatty liver    Flashing lights 04/07/2015   GERD 10/18/2009   Qualifier: Diagnosis of  By: Windy ROSALEA Consuelo     GERD (gastroesophageal reflux disease)    Hypertension    Intertrigo 12/25/2016   Leg cramping 01/29/2013   Macromastia 12/19/2013   Otitis, externa, infective 06/04/2017   PONV (postoperative nausea and vomiting)    with pain med   Shortness of breath 06/01/2019   Sinusitis 01/04/2012   Sleep apnea    previously no longer neg sleep study   Thoracic nerve root impingement 08-05/2010   Severe right flank pain. Resolved with thoracic epidural steroid injection.    Umbilical hernia    UTI (lower urinary tract infection)    recent- unable to finish med only took 2 days   Vertigo 02/24/2014    Past Surgical History:  Procedure Laterality Date   ABDOMINAL HYSTERECTOMY  2004   partial   BACK SURGERY  04,06   x2   BILATERAL OOPHORECTOMY  2010   2/2 malignancy    CARPAL TUNNEL RELEASE Bilateral ?1999   CESAREAN SECTION  1989   LAMINECTOMY AND MICRODISCECTOMY LUMBAR SPINE  2004; 2006   TOTAL KNEE ARTHROPLASTY Left 10/06/2013   Procedure: TOTAL KNEE ARTHROPLASTY;  Surgeon: Evalene JONETTA Chancy, MD;  Location: [facility] OR;  Service: Orthopedics;  Laterality: Left;   TOTAL KNEE ARTHROPLASTY Right 11/09/2013   Procedure: RIGHT TOTAL KNEE ARTHROPLASTY;  Surgeon: Evalene JONETTA Chancy, MD;  Location: [facility] OR;  Service: Orthopedics;  Laterality: Right;    Family History  Problem Relation Age of Onset   Breast cancer Mother     Social History:  reports that she has never smoked. She has never used smokeless tobacco. She reports that she does not drink alcohol and does not use drugs.  Allergies:  Allergies  Allergen Reactions   Allopurinol  Other (See Comments)    Caused hair to fall out   Lubiprostone Nausea Only     Amitiza   Naproxen Hives   Other Nausea And Vomiting    This occurs with ANY ANESTHESIA or NARCOTICS   Oxycodone  Nausea Only    Can tolerate with Zofran     Medications: I have reviewed the patient's current medications.  Results for orders placed or performed during the hospital encounter of 04/14/24 (from the past 48 hours)  Comprehensive metabolic panel     Status: Abnormal   Collection Time: 04/14/24 10:06 AM  Result Value Ref Range   Sodium 138 135 - 145  mmol/L   Potassium 3.8 3.5 - 5.1 mmol/L   Chloride 104 98 - 111 mmol/L   CO2 22 22 - 32 mmol/L   Glucose, Bld 105 (H) 70 - 99 mg/dL    Comment: Glucose reference range applies only to samples taken after fasting for at least 8 hours.   BUN 9 8 - 23 mg/dL   Creatinine, Ser 9.11 0.44 - 1.00 mg/dL   Calcium  9.2 8.9 - 10.3 mg/dL   Total Protein 7.0 6.5 - 8.1 g/dL   Albumin 4.2 3.5 - 5.0 g/dL   AST 16 15 - 41 U/L   ALT 16 0 - 44 U/L   Alkaline Phosphatase 76 38 - 126 U/L   Total Bilirubin 0.8 0.0 - 1.2 mg/dL   GFR, Estimated >39 >39 mL/min    Comment: (NOTE) Calculated using the CKD-EPI  Creatinine Equation (2021)    Anion gap 11 5 - 15    Comment: Performed at [facility], [address] [place], [place] [address]  CBC with Differential     Status: None   Collection Time: 04/14/24 10:06 AM  Result Value Ref Range   WBC 8.0 4.0 - 10.5 K/uL   RBC 4.32 3.87 - 5.11 MIL/uL   Hemoglobin 13.7 12.0 - 15.0 g/dL   HCT 59.0 63.9 - 53.9 %   MCV 94.7 80.0 - 100.0 fL   MCH 31.7 26.0 - 34.0 pg   MCHC 33.5 30.0 - 36.0 g/dL   RDW 87.0 88.4 - 84.4 %   Platelets 197 150 - 400 K/uL   nRBC 0.0 0.0 - 0.2 %   Neutrophils Relative % 62 %   Neutro Abs 5.0 1.7 - 7.7 K/uL   Lymphocytes Relative 27 %   Lymphs Abs 2.2 0.7 - 4.0 K/uL   Monocytes Relative 9 %   Monocytes Absolute 0.7 0.1 - 1.0 K/uL   Eosinophils Relative 1 %   Eosinophils Absolute 0.0 0.0 - 0.5 K/uL   Basophils Relative 1 %   Basophils Absolute 0.1 0.0 - 0.1 K/uL   Immature Granulocytes 0 %   Abs Immature Granulocytes 0.01 0.00 - 0.07 K/uL    Comment: Performed at [facility], [address], [place], [place] [address]  Lactic acid, plasma     Status: None   Collection Time: 04/14/24 10:06 AM  Result Value Ref Range   Lactic Acid, Venous 0.9 0.5 - 1.9 mmol/L    Comment: Performed at [facility], [address], [place], [place] [address]  Lactic acid, plasma     Status: None   Collection Time: 04/14/24 10:06 AM  Result Value Ref Range   Lactic Acid, Venous 0.9 0.5 - 1.9 mmol/L    Comment: Performed at [facility], [address] [place], [place] [address]    CT Maxillofacial W Contrast Result Date: 04/14/2024 EXAM: CT Face with contrast 04/14/2024 11:42:01 AM TECHNIQUE: CT of the face was performed with the administration of intravenous contrast. Multiplanar reformatted images are provided for review. Automated exposure control, iterative reconstruction, and/or weight based adjustment of the mA/kV was utilized to reduce the radiation  dose to as low as reasonably achievable. COMPARISON: None available CLINICAL HISTORY: Dental abscess, ?deep infection. C/o right sided facial swelling due to potential dental abscess. Seen dentist yesterday. Prescribed antibiotics. States facial swelling tripled in size since yesterday. Also reports generalized weakness and fatigue. FINDINGS: AERODIGESTIVE TRACT: No discrete mass. No edema. SALIVARY GLANDS: Right-sided facial inflammation extends into the regions  of the right parotid and right submandibular glands without evidence of primary sialadenitis. LYMPH NODES: No suspicious cervical lymphadenopathy. SOFT TISSUES: Periapical lucency involving the right maxillary second premolar tooth. Overlying small fluid collection along the buccal surface of the maxilla measuring 13 x 3 mm. Regional inflammatory changes in the right buccal space and subcutaneous soft tissues extending throughout the mid and lower face. BRAIN, ORBITS, SINUSES AND MASTOIDS: Moderate mucosal thickening inferiorly in the right maxillary sinus and mild mucosal thickening in the left frontal sinus. Clear mastoid air cells and middle ear cavities. Partially empty sella. BONES: Cervical spondylosis. IMPRESSION: 1. Right maxillary second premolar periapical abscess with overlying 13 x 3 mm subperiosteal abscess along the buccal surface of the maxilla and regional soft tissue swelling/cellulitis. Electronically signed by: Dasie Hamburg MD 04/14/2024 12:08 PM EDT RP Workstation: [id number]   CT Soft Tissue Neck W Contrast Result Date: 04/14/2024 EXAM: CT NECK WITH CONTRAST 04/14/2024 11:42:01 AM TECHNIQUE: CT of the neck was performed with the administration of intravenous contrast. Multiplanar reformatted images are provided for review. Automated exposure control, iterative reconstruction, and/or weight based adjustment of the mA/kV was utilized to reduce the radiation dose to as low as reasonably achievable. COMPARISON: None available. CLINICAL  HISTORY: Right-sided facial swelling due to potential dental abscess. Seen dentist yesterday. Prescribed antibiotics. States facial swelling tripled in size since yesterday. Also reports generalized weakness and fatigue. FINDINGS: AERODIGESTIVE TRACT: No discrete mass. No edema. SALIVARY GLANDS: Right-sided facial inflammation extends into the regions of the right parotid and right submandibular glands without evidence of primary sialadenitis. THYROID: Bilateral thyroid nodules measuring up to 1.3 cm with no follow up imaging required. LYMPH NODES: No suspicious cervical lymphadenopathy. SOFT TISSUES: Periapical lucency involving the right maxillary second premolar tooth. Overlying small fluid collection along the buccal surface of the maxilla measuring 13 x 3 mm. Regional inflammatory changes in the right buccal space and subcutaneous soft tissues extending throughout the mid and lower face. BRAIN, ORBITS, SINUSES AND MASTOIDS: Moderate mucosal thickening inferiorly in the right maxillary sinus and partially visualized mild mucosal thickening in the left frontal sinus. Clear mastoid air cells and middle ear cavities. LUNGS AND MEDIASTINUM: No acute abnormality. BONES: Mild-to-moderate cervical spondylosis. IMPRESSION: 1. Right maxillary second premolar periapical abscess with overlying 13 x 3 mm subperiosteal abscess along the buccal surface of the maxilla and regional soft tissue swelling/cellulitis. Electronically signed by: Dasie Hamburg MD 04/14/2024 12:02 PM EDT RP Workstation: [id number]   ROS: as above  Blood pressure (!) 160/70, pulse 77, temperature 99.5 F (37.5 C), temperature source Oral, resp. rate 17, height 5' 6 (1.676 m), weight 129.7 kg, SpO2 95%.  PHYSICAL EXAM:  CONSTITUTIONAL: well developed, alert and oriented x 3 CARDIOVASCULAR: normal rate PULMONARY/CHEST WALL: effort normal and no stridor, no stertor, no dysphonia HENT: Head : normocephalic Nose: nose normal and no  purulence Mouth/Throat:  Mouth: uvula midline; right maxillary gingiva fluctuant collection 2x1 cm, no other oral lesions appreciated, dentition moderately poor Throat: oropharynx clear and moist Mucous membranes: normal EYES: conjunctiva normal, EOM normal and PERRL NECK: supple, trachea normal and no thyromegaly or cervical LAD Right facial swelling involving right cheek but no lid edema, compared to pictures from this morning, significantly better  Studies Reviewed: CT Neck and Face 04/14/2024 independently interpreted, agree with read:Right maxillary second premolar periapical abscess with overlying 13 x 3 mm subperiosteal abscess along the buccal surface of the maxilla and regional soft tissue swelling/cellulitis. WBC 04/14/2024: 8.0  Assessment/Plan: 63 y.o. with:  Right maxillary subperiosteal abscess (odontogenic) Facial cellulitis Essentially abx naive; small abscess drained at bedside but ultimately needs tooth addressed for source control Agree with IV abx with unasyn  and vanc --- can de-escalate to augmentin  and bactrim  04/16/2024 if continues to improve and discharge if medicine team thinks appropriate F/u cultures [facility] for PO Page ENT with any questions; will need dental follow up for source control  MDM:  mod Complexity/Problems addressed: mod Data complexity: mod - independent CT interpretation - Morbidity: mod  - Prescription Drug rec: y    Eldora KATHEE Blanch   04/14/2024, 7:26 PM

## 2024-04-14 NOTE — Assessment & Plan Note (Addendum)
 HTN: continue home losartan   HLD: continue home atorvastatin  Anxiety/depression: continue home Wellbutrin  and venlafaxine  Neuropathic pain: continue home gabapentin  300mg  TID

## 2024-04-14 NOTE — Plan of Care (Signed)
 FMTS Brief Progress Note  S: Examined patient with son and daughter at bedside. Patient states swelling has gone down after I&D by ENT but continues to report pain. She is requesting IV dilaudid  and something to help her sleep.   O: BP (!) 160/70   Pulse 77   Temp 99.5 F (37.5 C) (Oral)   Resp 17   Ht 5' 6 (1.676 m)   Wt 129.7 kg   SpO2 95%   BMI 46.16 kg/m    General: Alert, well-appearing female in NAD.  HEENT: Facial swelling seems to be improved. TTP to the R cheek. Oropharynx clear without erythema or exudate Cardiovascular: RRR, S1/S2 normal. No murmurs, rubs, or gallops appreciated. Pulmonary: Normal work of breathing. CTAB with no wheezes or crackles present Abdomen: Soft, non-tender, non-distended Neurologic: No focal deficits. Skin: No rashes or lesions. Psych: Appropriate mood and affect  A/P: Dental abscess Swelling is improved but pain is ongoing. Patient has not yet receieved Tyelnol so will attempt to control pain with what is currently ordered. Will also provide sleep aid as needed. Nurse also reported anaerobic culture was not sent. - Nurse reported anaerobic culture was not sent. Will attempt to obtain with swab but no obvious drainage visualized now - Continue PO Tylenol  1000mg  q6hrs PRN and PO oxycodone  5mg  q4hrs PRN along with chloraseptic spray for pain control - Added Gabapentin  300mg  once and melatonin 5mg  daily for sleep aid - Continue IV abx - Orders reviewed. Labs for AM ordered, which was adjusted as needed.   Jerrie Gathers, DO 04/14/2024, 8:11 PM PGY-1, [facility] Family Medicine Night Resident  Please page [phone] with questions.

## 2024-04-14 NOTE — H&P (Signed)
 Patient presents with a hx of right-sided facial swelling and jaw pain, which started yesterday. She was seen by her dentist yesterday, who started her on an oral A/B. However, her facial pain and swelling worsened today, which prompted an ED visit. No fever at home  Exam Gen: No distress HEENT: Right facial swelling and tenderness with no Erythema. She could open her mouth wide with no Trismus. No gum swelling or erythema Heart: S1 S2 normal, no murmurs. RRR Lungs: CTA B/L Abd: Soft, NT/ND, BS+, and normoactive  A/P: Periapical abscess Maxillary CT head reviewed: Right maxillary second premolar periapical abscess with overlying 13 x 3 mm subperiosteal abscess along the buccal surface of the maxilla and regional soft tissue swelling/cellulitis. ED provided a discussion of the case with ENT/Dr. Tobie, who recommended IV A/B and outpatient dental follow-up. She was started on IV Unasym with great improvement - she showed me her picture from this morning with facial swelling and erythema. Continue IV antibiotic Oral oxycodone  prn pain Monitor for improvement.  I have discussed the plan with the resident. I will cosign their note once it is completed.  HTN: Pain is likely contributing to this. On Losartan  25 mg QD at home Plan to resume when appropriate Monitor BP closely  For chronic problems - review home regimen and resume as appropriate.

## 2024-04-14 NOTE — ED Triage Notes (Signed)
 C/o r sided facial swelling d/t potential dental abscess.Seen dentist yesterday. Prescribed antibiotics. States facial swelling tripled in size since yesterday. Also reports generalized weakness and fatigue.  No airway involvement.

## 2024-04-14 NOTE — Plan of Care (Signed)
  Problem: Education: Goal: Knowledge of General Education information will improve Description: Including pain rating scale, medication(s)/side effects and non-pharmacologic comfort measures Outcome: Progressing   Problem: Health Behavior/Discharge Planning: Goal: Ability to manage health-related needs will improve Outcome: Progressing   Problem: Pain Managment: Goal: General experience of comfort will improve and/or be controlled Outcome: Progressing   Problem: Safety: Goal: Ability to remain free from injury will improve Outcome: Progressing   Problem: Skin Integrity: Goal: Risk for impaired skin integrity will decrease Outcome: Progressing   Problem: Elimination: Goal: Will not experience complications related to bowel motility Outcome: Progressing   Problem: Elimination: Goal: Will not experience complications related to urinary retention Outcome: Progressing   Problem: Coping: Goal: Level of anxiety will decrease Outcome: Progressing

## 2024-04-14 NOTE — Progress Notes (Signed)
 ED Pharmacy Antibiotic Sign Off An antibiotic consult was received from an ED provider for vancoycin per pharmacy dosing for odontogenic infxn. A chart review was completed to assess appropriateness.   The following one time order(s) were placed:  Vancomycin  2g IV x 1  Further antibiotic and/or antibiotic pharmacy consults should be ordered by the admitting provider if indicated.   Thank you for allowing pharmacy to be a part of this patient's care.   Yvon Mccord, Vernell Helling, [facility]  Clinical Pharmacist 04/14/24 12:53 PM

## 2024-04-14 NOTE — ED Notes (Signed)
Attempted to call report.  No answer on unit. 

## 2024-04-14 NOTE — Progress Notes (Deleted)
 I went to the patient's bedside to discuss her need for a blood transfusion. With her were her sister, Diane Mitchell, and two other family members. She gave permission to discuss the test results while her family members were present. I discussed her low hemoglobin level < 8, which is her blood transfusion threshold, and the need for blood transfusion. Patient is adamant that she does not want to be transfused with any blood product. She is aware of the implications of not getting a blood transfusion with hemoglobin < 8 and her history of CAD. She understands this can lead to multiple complications, including death. She stated she is already receiving Micera, which is all she is willing to take at this time.  I spoke with Ronal Plants, a member of the Ethics Committee, to determine if there is any paperwork to complete regarding her treatment refusal. She recommends that the patient have advance directives on file; otherwise, no other paperwork is needed. We will initiate an advanced directive inpatient with spiritual consultation or with her PCP.  I discussed this issue with Dr. Lynwood Lin/Nephrology, and he recommended Feraheme dose x 1 prior to discharge from the hospital, and she can continue with her regular Micera regimen. ED precautions discussed. She and her family agreed with the plan.

## 2024-04-14 NOTE — H&P (Addendum)
 Hospital Admission History and Physical Service Pager: [phone]  Patient name: [name] Medical record number: [id number] Date of Birth: 07-02-1961 Age: 63 y.o. Gender: female  Primary Care Provider: Nicholas Bar, MD Consultants: ENT Code Status: FULL CODE Preferred Emergency Contact: Vinie Brought [phone]  Chief Complaint: dental abscess  Assessment and Plan: [name] is a 63 y.o. female presenting with R facial swelling and pain with dental abscess confirmed by CT imaging: right maxillary second premolar periapical abscess with surrounding cellulitis without evidence of deep space infection. Admitted for IV abx and pain control.  Assessment & Plan Dental infection Facial swelling [facility]mission from [facility] standalone ED for dental abscess. Seen by dentist and rx amoxicillin  which she took 3 doses of before coming to ED for evaluation.  - Admit to MedSurg under Dr. Anders for obs - Unasyn  1.5g IV q8h (8/26-)  - IV vancomycin  (8/26-) - Zofran  ODT for nausea with administration of meds - Pain regimen: Tylenol  1000mg  q6h PRN, oxycodone  5mg  q4h PRN and PRN chloraseptic spray - Plan for OP dentistry once pain improves - ENT drained abscess at bedside, recommended continuing IV Vanco/Unasyn .  Additionally recommended chlorhexidine  mouthwash. Chronic health problem HTN: continue home losartan   HLD: continue home atorvastatin  Anxiety/depression: continue home Wellbutrin  and venlafaxine  Neuropathic pain: continue home gabapentin  300mg  TID  FEN/GI: regular diet  VTE Prophylaxis: Lovenox   Disposition: Admit  History of Present Illness:  [name] is a 63 y.o. female presenting with R facial pain and swelling x4 days. Seen by dentist 04/13/2024, they were planning for root canal at later time, but patient said she cannot afford this procedure ($2000). She endorses onset of symptoms with R facial pain, watery eyes, pain with swallowing, R ear pain, sore  throat, and mild headache. She denies CP, SOB, vision changes or difficulty swallowing.   Seen at [facility] ED by Dr. Pamella. CT head: Right maxillary second premolar periapical abscess with surrounding cellulitis. No evidence of deep space infection. Patient was given vancomycin , unasyn , 1L IVF bolus, Zofran , morphine , and dexamethasone  with some relief.  Dr. Pamella spoke with Dr. Tobie (ENT) that said no surgical intervention. Recommend IV abx until sxs improved with plan for close dental follow-up.   Review Of Systems: Per HPI   Pertinent Past Medical History: HTN OSA Knee OA Ventral hernia Remainder reviewed in history tab.   Pertinent Past Surgical History:  Remainder reviewed in history tab.   Pertinent Social History: Tobacco use: No Alcohol use: Occasionally Other Substance use: None Lives with significant other  Pertinent Family History: Remainder reviewed in history tab.   Important Outpatient Medications: - Metformin  - Tylenol  - Lipitor  - Gabapentin  - Losartan  - Wellbutrin  - Effexor  Remainder reviewed in medication history.   Objective: BP (!) 160/70   Pulse 77   Temp 99.5 F (37.5 C) (Oral)   Resp 17   Ht 5' 6 (1.676 m)   Wt 129.7 kg   SpO2 95%   BMI 46.16 kg/m  Exam: General: obvious R facial swelling, uncomfortable appearing, A&O, NAD Eyes: non-icteric, pinpoint pupils b/l, PERRLA, EOMI ENTM: TTP of the R cheek, obvious large R facial swelling, no facial droop, posterior oropharynx clear without erythema or exudate, 2x1cm fluctuant, tender abscess to the upper right 2-5 teeth gum line without drainage or bleeding Neck: supple, full ROM, mild cervical lymphadenopathy to L under jaw Cardiovascular: RRR, no m/r/g Respiratory: CTAB, no increased WOB, no w/r/r Gastrointestinal: soft, non-tender, non-distended MSK: no peripheral edema Derm:  no rashes or lesions Neuro: CN II-XII grossly intact Psych: appropriate mood and affect  Labs:  CBC BMET   Recent Labs  Lab 04/14/24 1006  WBC 8.0  HGB 13.7  HCT 40.9  PLT 197   Recent Labs  Lab 04/14/24 1006  NA 138  K 3.8  CL 104  CO2 22  BUN 9  CREATININE 0.88  GLUCOSE 105*  CALCIUM  9.2     EKG: My own interpretation - similar to prior with mildly elevated T waves in leads V2/3    Imaging Studies Performed:  Imaging Study: CT Neck with contrast Impression from Radiologist: 1. Right maxillary second premolar periapical abscess with overlying 13 x 3 mm subperiosteal abscess along the buccal surface of the maxilla and regional soft tissue swelling/cellulitis.   Imaging study: CT Soft Tissue Neck w/contrast  Impression: 1. Right maxillary second premolar periapical abscess with overlying 13 x 3 mm subperiosteal abscess along the buccal surface of the maxilla and regional soft tissue swelling/cellulitis.  Lupie Credit, DO 04/14/2024, 4:55 PM PGY-1, [facility]  FPTS Intern pager: [phone], text pages welcome Secure chat group [facility] Teaching Service   Upper Level Addendum: I have seen and evaluated this patient along with Dr. Lupie and reviewed the above note, making necessary revisions as appropriate. I agree with the medical decision making and physical exam as noted above. Gladis Church, DO PGY-3 [facility] Family Medicine Residency

## 2024-04-14 NOTE — Plan of Care (Signed)

## 2024-04-14 NOTE — Progress Notes (Signed)
 63 y.o. F with MO, HTN, HLD, pre-diabetes recently developed tooth pain.  Saw a dentist yesterday, given amoxicillin  and scheduled for root canal.  Unfortunately, overnight, swelling worse, pain worse.  In the ER, swelling concerning on exam.  EDP spoke with ENT, who are not able to offer treatment of dental abscesses.    Admission requested for IV antibiotics.  Accepted to med surg bed.

## 2024-04-14 NOTE — ED Provider Notes (Signed)
 [place] EMERGENCY DEPARTMENT AT [facility] Provider Note   CSN: [id number] Arrival date & time: 04/14/24  9190     Patient presents with: Oral Swelling   [name] is a 63 y.o. female.  With a history of hypertension, sleep apnea and prediabetes who presents to the ED for concern of dental infection.  Patient has been dealing with a dental abscess in the right upper molar.  She was seen by dentist yesterday and started on amoxicillin  with a plan for root canal at a later time.  Overnight the swelling in her right face increased now including the left face and over the angle of the mandible.  Pain with swallowing.  Able to eat and drink.  No shortness of breath.  No reported fevers at home.  No prior history of allergies to antibiotics.   HPI     Prior to Admission medications   Medication Sig Start Date End Date Taking? Authorizing Provider  amoxicillin  (AMOXIL ) 500 MG capsule Take 500 mg by mouth 3 (three) times daily. 04/13/24  Yes [provider]  albuterol  (VENTOLIN  HFA) 108 (90 Base) MCG/ACT inhaler Inhale 2 puffs into the lungs every 6 (six) hours as needed for wheezing or shortness of breath. 10/13/21   Mahoney, Caitlin, MD  atorvastatin  (LIPITOR ) 40 MG tablet TAKE 2 TABLETS BY MOUTH EVERY DAY 09/10/23   Theophilus Pagan, MD  baclofen  (LIORESAL ) 10 MG tablet Take 1 tablet (10 mg total) by mouth 3 (three) times daily. 11/23/22   Macario Dorothyann HERO, MD  buPROPion  (WELLBUTRIN  XL) 150 MG 24 hr tablet Take 1 tablet (150 mg total) by mouth 2 (two) times daily. 08/23/23   Elicia Hamlet, MD  fexofenadine  (ALLEGRA  ALLERGY) 180 MG tablet Take 1 tablet (180 mg total) by mouth daily. Patient not taking: Reported on 04/12/2022 10/13/21   Henriette Mora, MD  fluticasone  (FLONASE ) 50 MCG/ACT nasal spray Place 2 sprays into both nostrils daily. 10/13/21   Mahoney, Caitlin, MD  gabapentin  (NEURONTIN ) 300 MG capsule Take 1 capsule (300 mg total) by mouth 3 (three) times daily.  08/23/23   Elicia Hamlet, MD  losartan  (COZAAR ) 25 MG tablet Take 1 tablet (25 mg total) by mouth at bedtime. 08/23/23   Elicia Hamlet, MD  metFORMIN  (GLUCOPHAGE -XR) 500 MG 24 hr tablet TAKE 2 TABLETS (1,000 MG TOTAL) BY MOUTH 2 (TWO) TIMES DAILY WITH A MEAL. START WITH ONE TABLET TWICE DAILY. IF TOLERATING WELL, INCREASE TO TWO TABLETS TWICE DAILY. 02/07/24   Nicholas Bar, MD  mirabegron  ER (MYRBETRIQ ) 25 MG TB24 tablet Take 1 tablet (25 mg total) by mouth daily. 08/23/23   Elicia Hamlet, MD  nystatin  (MYCOSTATIN /NYSTOP ) powder Apply topically 4 (four) times daily. Patient not taking: Reported on 04/12/2022 10/21/18   Sebastian Beverley NOVAK, MD  ondansetron  (ZOFRAN  ODT) 8 MG disintegrating tablet Take 1 tablet (8 mg total) by mouth every 8 (eight) hours as needed for nausea or vomiting. 05/05/21   Van Knee, MD  ondansetron  (ZOFRAN -ODT) 4 MG disintegrating tablet Take 1 tablet (4 mg total) by mouth every 8 (eight) hours as needed for nausea or vomiting. 11/23/22   Macario Dorothyann HERO, MD  polyethylene glycol powder (GLYCOLAX /MIRALAX ) powder Take 17 g by mouth 2 (two) times daily as needed. Patient taking differently: Take 17 g by mouth 2 (two) times daily as needed (for constipation). 10/21/18   Sebastian Beverley NOVAK, MD  venlafaxine  XR (EFFEXOR -XR) 37.5 MG 24 hr capsule TAKE 1 CAPSULE BY MOUTH DAILY WITH BREAKFAST. 09/17/23  Theophilus Pagan, MD  sucralfate  (CARAFATE ) 1 G tablet Take 1 tablet (1 g total) by mouth 4 (four) times daily. 12/26/10 11/02/11  Zackary Megan ORN, MD    Allergies: Allopurinol , Lubiprostone, Naproxen, Other, and Oxycodone     Review of Systems  Updated Vital Signs BP 126/75   Pulse 79   Temp 98 F (36.7 C) (Oral)   Resp 17   SpO2 93%   Physical Exam Vitals and nursing note reviewed.  HENT:     Head: Normocephalic and atraumatic.     Mouth/Throat:     Comments: Edema and tenderness over right angle of the mandible extending to the floor of the mouth Dental abscess over tooth #1  with no drainage Speaking full sentences Tolerating secretions Uvula is midline Mallampati class IV Eyes:     Pupils: Pupils are equal, round, and reactive to light.  Cardiovascular:     Rate and Rhythm: Normal rate and regular rhythm.  Pulmonary:     Effort: Pulmonary effort is normal.     Breath sounds: Normal breath sounds.  Abdominal:     Palpations: Abdomen is soft.     Tenderness: There is no abdominal tenderness.  Skin:    General: Skin is warm and dry.  Neurological:     Mental Status: She is alert.  Psychiatric:        Mood and Affect: Mood normal.     (all labs ordered are listed, but only abnormal results are displayed) Labs Reviewed  COMPREHENSIVE METABOLIC PANEL WITH GFR - Abnormal; Notable for the following components:      Result Value   Glucose, Bld 105 (*)    All other components within normal limits  CULTURE, BLOOD (ROUTINE X 2)  CULTURE, BLOOD (ROUTINE X 2)  CBC WITH DIFFERENTIAL/PLATELET  LACTIC ACID, PLASMA  LACTIC ACID, PLASMA    EKG: None  Radiology: CT Maxillofacial W Contrast Result Date: 04/14/2024 EXAM: CT Face with contrast 04/14/2024 11:42:01 AM TECHNIQUE: CT of the face was performed with the administration of intravenous contrast. Multiplanar reformatted images are provided for review. Automated exposure control, iterative reconstruction, and/or weight based adjustment of the mA/kV was utilized to reduce the radiation dose to as low as reasonably achievable. COMPARISON: None available CLINICAL HISTORY: Dental abscess, ?deep infection. C/o right sided facial swelling due to potential dental abscess. Seen dentist yesterday. Prescribed antibiotics. States facial swelling tripled in size since yesterday. Also reports generalized weakness and fatigue. FINDINGS: AERODIGESTIVE TRACT: No discrete mass. No edema. SALIVARY GLANDS: Right-sided facial inflammation extends into the regions of the right parotid and right submandibular glands without  evidence of primary sialadenitis. LYMPH NODES: No suspicious cervical lymphadenopathy. SOFT TISSUES: Periapical lucency involving the right maxillary second premolar tooth. Overlying small fluid collection along the buccal surface of the maxilla measuring 13 x 3 mm. Regional inflammatory changes in the right buccal space and subcutaneous soft tissues extending throughout the mid and lower face. BRAIN, ORBITS, SINUSES AND MASTOIDS: Moderate mucosal thickening inferiorly in the right maxillary sinus and mild mucosal thickening in the left frontal sinus. Clear mastoid air cells and middle ear cavities. Partially empty sella. BONES: Cervical spondylosis. IMPRESSION: 1. Right maxillary second premolar periapical abscess with overlying 13 x 3 mm subperiosteal abscess along the buccal surface of the maxilla and regional soft tissue swelling/cellulitis. Electronically signed by: Dasie Hamburg MD 04/14/2024 12:08 PM EDT RP Workstation: [id number]   CT Soft Tissue Neck W Contrast Result Date: 04/14/2024 EXAM: CT NECK WITH CONTRAST 04/14/2024 11:42:01 AM  TECHNIQUE: CT of the neck was performed with the administration of intravenous contrast. Multiplanar reformatted images are provided for review. Automated exposure control, iterative reconstruction, and/or weight based adjustment of the mA/kV was utilized to reduce the radiation dose to as low as reasonably achievable. COMPARISON: None available. CLINICAL HISTORY: Right-sided facial swelling due to potential dental abscess. Seen dentist yesterday. Prescribed antibiotics. States facial swelling tripled in size since yesterday. Also reports generalized weakness and fatigue. FINDINGS: AERODIGESTIVE TRACT: No discrete mass. No edema. SALIVARY GLANDS: Right-sided facial inflammation extends into the regions of the right parotid and right submandibular glands without evidence of primary sialadenitis. THYROID: Bilateral thyroid nodules measuring up to 1.3 cm with no follow up  imaging required. LYMPH NODES: No suspicious cervical lymphadenopathy. SOFT TISSUES: Periapical lucency involving the right maxillary second premolar tooth. Overlying small fluid collection along the buccal surface of the maxilla measuring 13 x 3 mm. Regional inflammatory changes in the right buccal space and subcutaneous soft tissues extending throughout the mid and lower face. BRAIN, ORBITS, SINUSES AND MASTOIDS: Moderate mucosal thickening inferiorly in the right maxillary sinus and partially visualized mild mucosal thickening in the left frontal sinus. Clear mastoid air cells and middle ear cavities. LUNGS AND MEDIASTINUM: No acute abnormality. BONES: Mild-to-moderate cervical spondylosis. IMPRESSION: 1. Right maxillary second premolar periapical abscess with overlying 13 x 3 mm subperiosteal abscess along the buccal surface of the maxilla and regional soft tissue swelling/cellulitis. Electronically signed by: Dasie Hamburg MD 04/14/2024 12:02 PM EDT RP Workstation: [id number]     Procedures   Medications Ordered in the ED  vancomycin  (VANCOCIN ) IVPB 1000 mg/200 mL premix (1,000 mg Intravenous New Bag/Given 04/14/24 1310)    And  vancomycin  (VANCOCIN ) IVPB 1000 mg/200 mL premix (has no administration in time range)  ampicillin -sulbactam (UNASYN ) 1.5 g in sodium chloride  0.9 % 100 mL IVPB (has no administration in time range)  sodium chloride  0.9 % bolus 1,000 mL (0 mLs Intravenous Stopped 04/14/24 1120)  ondansetron  (ZOFRAN ) injection 4 mg (4 mg Intravenous Given 04/14/24 1022)  morphine  (PF) 4 MG/ML injection 4 mg (4 mg Intravenous Given 04/14/24 1023)  Ampicillin -Sulbactam (UNASYN ) 3 g in sodium chloride  0.9 % 100 mL IVPB (0 g Intravenous Stopped 04/14/24 1104)  dexamethasone  (DECADRON ) injection 10 mg (10 mg Intravenous Given 04/14/24 1022)  iohexol  (OMNIPAQUE ) 300 MG/ML solution 100 mL (75 mLs Intravenous Contrast Given 04/14/24 1130)    Clinical Course as of 04/14/24 1431  Tue Apr 14, 2024  1244  CT shows Right maxillary second premolar periapical abscess with surrounding cellulitis.  No evidence of deep space infection.  Discussed with Dr. Tobie (ENT).  Low suspicion for deep space involvement at this time however given the rapid onset of swelling and her symptoms would be reasonable to keep her for admission and continue IV antibiotics until symptoms have improved with a plan for close dental follow-up. [MP]  1431 Discussed with admitting hospitalist who accepts patient for admission [MP]    Clinical Course User Index [MP] Pamella Ozell LABOR, DO                                 Medical Decision Making 63 year old female with history as above presenting for concern of odontogenic infection.  Seen by dentist yesterday and started on amoxicillin .  Facial swelling has progressed rapidly overnight.  No fevers.  Tolerating secretions.  Swelling over left face right face and over the right angle of  the mandible extending to the floor mouth.  Presentation certainly concerning for advancement of odontogenic infection including deep space infection and Ludwig angina.  Will obtain laboratory workup including CBC metabolic panel blood cultures lactic, start on Unasyn , give 1 dose of Decadron  and obtain CT imaging to evaluate the extent of the infection.  Pain controlled with morphine  and Zofran  for nausea.  Will reach out to ENT once workup is completed.  Admission likely.  Amount and/or Complexity of Data Reviewed Labs: ordered. Radiology: ordered.  Risk Prescription drug management. Decision regarding hospitalization.        Final diagnoses:  Periapical abscess  Facial swelling    ED Discharge Orders     None          Pamella Ozell LABOR, DO 04/14/24 1431

## 2024-04-14 NOTE — TOC CM/SW Note (Signed)
 Transition of Care [facility]) - Inpatient Brief Assessment   Patient Details  Name: [name] MRN: [id number] Date of Birth: [birth year]  [facility] CM/SW Contact:    Lauraine FORBES Saa, LCSWA Phone Number: 04/14/2024, 4:48 PM   Clinical Narrative:  4:48 PM Per chart review, patient resides at home with significant other. Patient has a PCP and insurance. Patient does not have SNF history. Patient has HH history with [facility] and DME (CPAP, CPM, RW, BSC) history with [facility] and [facility]. Patient's preferred pharmacy is [facility]. No TOC needs were identified at this time. TOC will continue to follow and be available to assist.  Transition of Care Asessment: Insurance and Status: Insurance coverage has been reviewed Patient has primary care physician: Yes Home environment has been reviewed: Private Residence Prior level of function:: N/A Prior/Current Home Services: No current home services Social Drivers of Health Review: SDOH reviewed no interventions necessary Readmission risk has been reviewed: Yes (Currently Observation Status) Transition of care needs: no transition of care needs at this time

## 2024-04-14 NOTE — Procedures (Signed)
 Procedure Note Pre-procedure diagnosis: Right maxillary Dentoalveolar Abscess Post-procedure diagnosis: Same Procedure: Incision and Drainage of Right maxillary dentoalveolar Abscess, CPT 41800 Complications: None apparent EBL: <10cc  Indication: [name] is a 63 y.o.  female with concern for abscess as above. Decision was made for incision and drainage of abscess following discussion of risks/benefits of procedure.  The patient was identified as the correct patient.  The patient is able to give consent, therefore verbal consent was obtained. The fluctuant mucosa of maxillary gingiva was cleansed and 1% Lidocaine  with 1:100,000 epinephrine  was injected surrounding the area of fluctuance.  A 1 cm stab incision was made through mucosa. Hemostat was used to dissect into abscess pocket. Purulence was encountered. Wound culture was obtained. Loculation were broken up with hemostat. Wound was irrigated and left open. The skin was cleansed.   Patient tolerated this procedure well and there were no immediately apparent complications.  Electronically signed by: Eldora KATHEE Blanch, MD 04/14/2024 7:41 PM

## 2024-04-14 NOTE — Progress Notes (Signed)
 Pharmacy Antibiotic Note  [name] is a 63 y.o. female admitted on 04/14/2024 with a dental abscess.  Pharmacy has been consulted for vancomycin  dosing.  -WBC= 8, afeb, CrCl ~ 90  Plan: -Vancomycin  1750 mg IV q24h (estimated AUC= 499 using SCr 0.88) -Will follow renal function, cultures and clinical progress      Height: 5' 6 (167.6 cm) Weight: 129.7 kg (286 lb) IBW/kg (Calculated) : 59.3  Temp (24hrs), Avg:98.7 F (37.1 C), Min:97.9 F (36.6 C), Max:99.5 F (37.5 C)  Recent Labs  Lab 04/14/24 1006  WBC 8.0  CREATININE 0.88  LATICACIDVEN 0.9  0.9    Estimated Creatinine Clearance: 88 mL/min (by C-G formula based on SCr of 0.88 mg/dL).    Allergies  Allergen Reactions   Allopurinol  Other (See Comments)    Caused hair to fall out   Lubiprostone Nausea Only     Amitiza   Naproxen Hives   Other Nausea And Vomiting    This occurs with ANY ANESTHESIA or NARCOTICS   Oxycodone  Nausea Only    Can tolerate with Zofran     Antimicrobials this admission:  Dose adjustments this admission: 8/26 Unasyn >> 8/26 vanc>>  Microbiology results: 8/26 blood x2  Thank you for allowing pharmacy to be a part of this patient's care.  Prentice Poisson, PharmD Clinical Pharmacist **Pharmacist phone directory can now be found on [email or web address] ([email or web address]).  Listed under [facility].

## 2024-04-14 NOTE — ED Notes (Signed)
 [name] with cl called for transport

## 2024-04-15 DIAGNOSIS — R220 Localized swelling, mass and lump, head: Secondary | ICD-10-CM | POA: Diagnosis not present

## 2024-04-15 DIAGNOSIS — K047 Periapical abscess without sinus: Secondary | ICD-10-CM | POA: Diagnosis not present

## 2024-04-15 LAB — CBC
HCT: 38.8 % (ref 36.0–46.0)
Hemoglobin: 12.9 g/dL (ref 12.0–15.0)
MCH: 31.2 pg (ref 26.0–34.0)
MCHC: 33.2 g/dL (ref 30.0–36.0)
MCV: 93.7 fL (ref 80.0–100.0)
Platelets: 190 K/uL (ref 150–400)
RBC: 4.14 MIL/uL (ref 3.87–5.11)
RDW: 12.7 % (ref 11.5–15.5)
WBC: 10.4 K/uL (ref 4.0–10.5)
nRBC: 0 % (ref 0.0–0.2)

## 2024-04-15 LAB — BASIC METABOLIC PANEL WITH GFR
Anion gap: 10 (ref 5–15)
BUN: 10 mg/dL (ref 8–23)
CO2: 24 mmol/L (ref 22–32)
Calcium: 9.1 mg/dL (ref 8.9–10.3)
Chloride: 104 mmol/L (ref 98–111)
Creatinine, Ser: 0.89 mg/dL (ref 0.44–1.00)
GFR, Estimated: >60 mL/min (ref >60)
Glucose, Bld: 130 mg/dL — ABNORMAL HIGH (ref 70–99)
Potassium: 4.4 mmol/L (ref 3.5–5.1)
Sodium: 138 mmol/L (ref 135–145)

## 2024-04-15 NOTE — Progress Notes (Signed)
 ENT CONSULT:  Reason for Consult: Right maxillary odontogenic abscess   Referring Physician: ED/Medicine team   HPI: [name] is an 63 y.o. female with right facial swelling and pain who ENT was consulted for management help. Reports her right maxillary teeth were hurting for the past few days, went to dentist on Monday, who reported she needed a root canal and placed on amoxicillin . Had significant facial swelling starting yesterday which worsened and presented to ED where a CT was done showing periapical lucency right maxillary molar with associated abscess and facial cellulitis.   She was placed on IV antibiotics. She reports she is doing much better with facial swelling improving since admission. She is tolerating soft foods. No prior dental abscess.  04/15/2024: Update -- NAEON. Patient reports that she is doing significantly better. Pain and swelling significantly better, just with some tenderness over cheek. Tolerating PO. She is happy with her progress. We discussed importance of dental visit.   Past Medical History:  Diagnosis Date   Acute pain of right shoulder 06/01/2019   Arthritis    Blepharitis 11/22/2020   Blurry vision, left eye 04/07/2015   Chronic bilateral low back pain without sciatica 06/01/2019   Chronic cough 12/02/2018   Fatty liver    Flashing lights 04/07/2015   GERD 10/18/2009   Qualifier: Diagnosis of  By: Windy ROSALEA Consuelo     GERD (gastroesophageal reflux disease)    Hypertension    Intertrigo 12/25/2016   Leg cramping 01/29/2013   Macromastia 12/19/2013   Otitis, externa, infective 06/04/2017   PONV (postoperative nausea and vomiting)    with pain med   Shortness of breath 06/01/2019   Sinusitis 01/04/2012   Sleep apnea    previously no longer neg sleep study   Thoracic nerve root impingement 08-05/2010   Severe right flank pain. Resolved with thoracic epidural steroid injection.    Umbilical hernia    UTI (lower urinary tract  infection)    recent- unable to finish med only took 2 days   Vertigo 02/24/2014    Past Surgical History:  Procedure Laterality Date   ABDOMINAL HYSTERECTOMY  2004   partial   BACK SURGERY  04,06   x2   BILATERAL OOPHORECTOMY  2010   2/2 malignancy   CARPAL TUNNEL RELEASE Bilateral ?1999   CESAREAN SECTION  1989   LAMINECTOMY AND MICRODISCECTOMY LUMBAR SPINE  2004; 2006   TOTAL KNEE ARTHROPLASTY Left 10/06/2013   Procedure: TOTAL KNEE ARTHROPLASTY;  Surgeon: Evalene JONETTA Chancy, MD;  Location: [facility] OR;  Service: Orthopedics;  Laterality: Left;   TOTAL KNEE ARTHROPLASTY Right 11/09/2013   Procedure: RIGHT TOTAL KNEE ARTHROPLASTY;  Surgeon: Evalene JONETTA Chancy, MD;  Location: [facility] OR;  Service: Orthopedics;  Laterality: Right;    Family History  Problem Relation Age of Onset   Breast cancer Mother     Social History:  reports that she has never smoked. She has never used smokeless tobacco. She reports that she does not drink alcohol and does not use drugs.  Allergies:  Allergies  Allergen Reactions   Allopurinol  Other (See Comments)    Caused hair to fall out   Lubiprostone Nausea Only     Amitiza   Naproxen Hives   Other Nausea And Vomiting    This occurs with ANY ANESTHESIA or NARCOTICS   Oxycodone  Nausea Only    Can tolerate with Zofran     Medications: I have reviewed the patient's current medications.  Results for orders placed or  performed during the hospital encounter of 04/14/24 (from the past 48 hours)  Culture, blood (routine x 2)     Status: None (Preliminary result)   Collection Time: 04/14/24  9:21 AM   Specimen: BLOOD  Result Value Ref Range   Specimen Description      BLOOD BLOOD RIGHT FOREARM Performed at [facility], [address], [place], [place] [address]    Special Requests      Blood Culture results may not be optimal due to an inadequate volume of blood received in culture bottles Performed at [facility],  [address] [place], [place] [address]    Culture      NO GROWTH < 24 HOURS Performed at [facility], [address], [place], [place] [address]    Report Status PENDING   Comprehensive metabolic panel     Status: Abnormal   Collection Time: 04/14/24 10:06 AM  Result Value Ref Range   Sodium 138 135 - 145 mmol/L   Potassium 3.8 3.5 - 5.1 mmol/L   Chloride 104 98 - 111 mmol/L   CO2 22 22 - 32 mmol/L   Glucose, Bld 105 (H) 70 - 99 mg/dL    Comment: Glucose reference range applies only to samples taken after fasting for at least 8 hours.   BUN 9 8 - 23 mg/dL   Creatinine, Ser 9.11 0.44 - 1.00 mg/dL   Calcium  9.2 8.9 - 10.3 mg/dL   Total Protein 7.0 6.5 - 8.1 g/dL   Albumin 4.2 3.5 - 5.0 g/dL   AST 16 15 - 41 U/L   ALT 16 0 - 44 U/L   Alkaline Phosphatase 76 38 - 126 U/L   Total Bilirubin 0.8 0.0 - 1.2 mg/dL   GFR, Estimated >39 >39 mL/min    Comment: (NOTE) Calculated using the CKD-EPI Creatinine Equation (2021)    Anion gap 11 5 - 15    Comment: Performed at [facility], [address] [place], [place] [address]  CBC with Differential     Status: None   Collection Time: 04/14/24 10:06 AM  Result Value Ref Range   WBC 8.0 4.0 - 10.5 K/uL   RBC 4.32 3.87 - 5.11 MIL/uL   Hemoglobin 13.7 12.0 - 15.0 g/dL   HCT 59.0 63.9 - 53.9 %   MCV 94.7 80.0 - 100.0 fL   MCH 31.7 26.0 - 34.0 pg   MCHC 33.5 30.0 - 36.0 g/dL   RDW 87.0 88.4 - 84.4 %   Platelets 197 150 - 400 K/uL   nRBC 0.0 0.0 - 0.2 %   Neutrophils Relative % 62 %   Neutro Abs 5.0 1.7 - 7.7 K/uL   Lymphocytes Relative 27 %   Lymphs Abs 2.2 0.7 - 4.0 K/uL   Monocytes Relative 9 %   Monocytes Absolute 0.7 0.1 - 1.0 K/uL   Eosinophils Relative 1 %   Eosinophils Absolute 0.0 0.0 - 0.5 K/uL   Basophils Relative 1 %   Basophils Absolute 0.1 0.0 - 0.1 K/uL   Immature Granulocytes 0 %   Abs Immature Granulocytes 0.01 0.00 - 0.07 K/uL    Comment: Performed at [facility], [address] [place], [place] [address]  Lactic acid, plasma     Status: None   Collection Time: 04/14/24 10:06 AM  Result Value Ref Range   Lactic Acid, Venous 0.9 0.5 - 1.9 mmol/L    Comment: Performed at [facility], [phone]  [place], [place], [place] [address]  Lactic acid, plasma     Status: None   Collection Time: 04/14/24 10:06 AM  Result Value Ref Range   Lactic Acid, Venous 0.9 0.5 - 1.9 mmol/L    Comment: Performed at [facility], [address] [place], [place] [address]  Aerobic/Anaerobic Culture w Gram Stain (surgical/deep wound)     Status: None (Preliminary result)   Collection Time: 04/14/24  6:07 PM   Specimen: Oral Mucosa/Gingiva  Result Value Ref Range   Specimen Description ORAL    Special Requests NONE    Gram Stain      NO WBC SEEN RARE GRAM POSITIVE RODS Performed at [facility], [address] [place], [place] [address]    Culture PENDING    Report Status PENDING   Culture, blood (routine x 2)     Status: None (Preliminary result)   Collection Time: 04/14/24  7:00 PM   Specimen: BLOOD  Result Value Ref Range   Specimen Description BLOOD BLOOD LEFT ARM    Special Requests      BOTTLES DRAWN AEROBIC AND ANAEROBIC Blood Culture adequate volume   Culture      NO GROWTH < 24 HOURS Performed at [facility], [address] [place], [place] [address]    Report Status PENDING   Basic metabolic panel     Status: Abnormal   Collection Time: 04/15/24  4:25 AM  Result Value Ref Range   Sodium 138 135 - 145 mmol/L   Potassium 4.4 3.5 - 5.1 mmol/L   Chloride 104 98 - 111 mmol/L   CO2 24 22 - 32 mmol/L   Glucose, Bld 130 (H) 70 - 99 mg/dL    Comment: Glucose reference range applies only to samples taken after fasting for at least 8 hours.   BUN 10 8 - 23 mg/dL   Creatinine, Ser 9.10 0.44 - 1.00 mg/dL   Calcium  9.1 8.9 - 10.3 mg/dL   GFR, Estimated >39 >39 mL/min    Comment:  (NOTE) Calculated using the CKD-EPI Creatinine Equation (2021)    Anion gap 10 5 - 15    Comment: Performed at [facility], [address] [place], [place] [address]  CBC     Status: None   Collection Time: 04/15/24  4:25 AM  Result Value Ref Range   WBC 10.4 4.0 - 10.5 K/uL   RBC 4.14 3.87 - 5.11 MIL/uL   Hemoglobin 12.9 12.0 - 15.0 g/dL   HCT 61.1 63.9 - 53.9 %   MCV 93.7 80.0 - 100.0 fL   MCH 31.2 26.0 - 34.0 pg   MCHC 33.2 30.0 - 36.0 g/dL   RDW 87.2 88.4 - 84.4 %   Platelets 190 150 - 400 K/uL   nRBC 0.0 0.0 - 0.2 %    Comment: Performed at [facility], [address], [place], [place] [address]    CT Maxillofacial W Contrast Result Date: 04/14/2024 EXAM: CT Face with contrast 04/14/2024 11:42:01 AM TECHNIQUE: CT of the face was performed with the administration of intravenous contrast. Multiplanar reformatted images are provided for review. Automated exposure control, iterative reconstruction, and/or weight based adjustment of the mA/kV was utilized to reduce the radiation dose to as low as reasonably achievable. COMPARISON: None available CLINICAL HISTORY: Dental abscess, ?deep infection. C/o right sided facial swelling due to potential dental abscess. Seen dentist yesterday. Prescribed antibiotics. States facial swelling tripled in size since yesterday. Also  reports generalized weakness and fatigue. FINDINGS: AERODIGESTIVE TRACT: No discrete mass. No edema. SALIVARY GLANDS: Right-sided facial inflammation extends into the regions of the right parotid and right submandibular glands without evidence of primary sialadenitis. LYMPH NODES: No suspicious cervical lymphadenopathy. SOFT TISSUES: Periapical lucency involving the right maxillary second premolar tooth. Overlying small fluid collection along the buccal surface of the maxilla measuring 13 x 3 mm. Regional inflammatory changes in the right buccal space and subcutaneous soft tissues extending throughout the mid and  lower face. BRAIN, ORBITS, SINUSES AND MASTOIDS: Moderate mucosal thickening inferiorly in the right maxillary sinus and mild mucosal thickening in the left frontal sinus. Clear mastoid air cells and middle ear cavities. Partially empty sella. BONES: Cervical spondylosis. IMPRESSION: 1. Right maxillary second premolar periapical abscess with overlying 13 x 3 mm subperiosteal abscess along the buccal surface of the maxilla and regional soft tissue swelling/cellulitis. Electronically signed by: Dasie Hamburg MD 04/14/2024 12:08 PM EDT RP Workstation: [id number]   CT Soft Tissue Neck W Contrast Result Date: 04/14/2024 EXAM: CT NECK WITH CONTRAST 04/14/2024 11:42:01 AM TECHNIQUE: CT of the neck was performed with the administration of intravenous contrast. Multiplanar reformatted images are provided for review. Automated exposure control, iterative reconstruction, and/or weight based adjustment of the mA/kV was utilized to reduce the radiation dose to as low as reasonably achievable. COMPARISON: None available. CLINICAL HISTORY: Right-sided facial swelling due to potential dental abscess. Seen dentist yesterday. Prescribed antibiotics. States facial swelling tripled in size since yesterday. Also reports generalized weakness and fatigue. FINDINGS: AERODIGESTIVE TRACT: No discrete mass. No edema. SALIVARY GLANDS: Right-sided facial inflammation extends into the regions of the right parotid and right submandibular glands without evidence of primary sialadenitis. THYROID: Bilateral thyroid nodules measuring up to 1.3 cm with no follow up imaging required. LYMPH NODES: No suspicious cervical lymphadenopathy. SOFT TISSUES: Periapical lucency involving the right maxillary second premolar tooth. Overlying small fluid collection along the buccal surface of the maxilla measuring 13 x 3 mm. Regional inflammatory changes in the right buccal space and subcutaneous soft tissues extending throughout the mid and lower face. BRAIN,  ORBITS, SINUSES AND MASTOIDS: Moderate mucosal thickening inferiorly in the right maxillary sinus and partially visualized mild mucosal thickening in the left frontal sinus. Clear mastoid air cells and middle ear cavities. LUNGS AND MEDIASTINUM: No acute abnormality. BONES: Mild-to-moderate cervical spondylosis. IMPRESSION: 1. Right maxillary second premolar periapical abscess with overlying 13 x 3 mm subperiosteal abscess along the buccal surface of the maxilla and regional soft tissue swelling/cellulitis. Electronically signed by: Dasie Hamburg MD 04/14/2024 12:02 PM EDT RP Workstation: [id number]     Blood pressure (!) 159/72, pulse 79, temperature (!) 97.5 F (36.4 C), temperature source Oral, resp. rate 20, height 5' 6 (1.676 m), weight 129.7 kg, SpO2 97%.  PHYSICAL EXAM: CONSTITUTIONAL: well developed, alert and oriented x 3 CARDIOVASCULAR: normal rate PULMONARY/CHEST WALL: effort normal and no stridor, no stertor, no dysphonia HENT: Head : normocephalic Nose: nose normal and no purulence Mouth/Throat:  Mouth: uvula midline; right maxillary gingiva with slight edema but no fluctuance or purulence currently expressible; no other oral lesions appreciated, dentition moderately poor Throat: oropharynx clear and moist Mucous membranes: normal EYES: conjunctiva normal, EOM normal and PERRL NECK: supple, trachea normal and no thyromegaly or cervical LAD Right facial swelling involving right cheek is significantly better; mild tenderness and induration right cheek around infected tooth  Studies Reviewed: CT Neck and Face 04/14/2024 independently interpreted, agree with read:Right maxillary second premolar periapical abscess with overlying  13 x 3 mm subperiosteal abscess along the buccal surface of the maxilla and regional soft tissue swelling/cellulitis. WBC 04/14/2024: 8.0 WBC 04/15/2024: 10.4 Assessment/Plan: 63 y.o. with:   Right maxillary subperiosteal abscess (odontogenic) Facial  cellulitis Essentially abx naive; small abscess drained at bedside but ultimately needs tooth addressed for source control Agree with IV abx with unasyn  and vanc for today --- can de-escalate to augmentin  and bactrim  04/16/2024 if continues to improve and discharge F/u cultures [facility] for PO She needs dentistry follow up at discharge for source control; return precautions discussed    04/15/2024, 4:07 PM

## 2024-04-15 NOTE — Plan of Care (Signed)
  Problem: Education: Goal: Knowledge of General Education information will improve Description: Including pain rating scale, medication(s)/side effects and non-pharmacologic comfort measures Outcome: Progressing   Problem: Elimination: Goal: Will not experience complications related to bowel motility Outcome: Progressing   Problem: Pain Managment: Goal: General experience of comfort will improve and/or be controlled Outcome: Progressing   Problem: Safety: Goal: Ability to remain free from injury will improve Outcome: Progressing   Problem: Skin Integrity: Goal: Risk for impaired skin integrity will decrease Outcome: Progressing   Problem: Coping: Goal: Level of anxiety will decrease Outcome: Progressing   Problem: Nutrition: Goal: Adequate nutrition will be maintained Outcome: Progressing

## 2024-04-15 NOTE — Assessment & Plan Note (Addendum)
 Improved after I&D - IV Unasyn  q8h (8/26-) - IV vancomycin  q24h (8/26-) - Will plan to switch to PO tomorrow 8/28 per ENT recs - F/u abscess culture - Blood culture no growth <24hrs - Pain regimen: Tylenol  1000mg  q6h PRN, oxycodone  5mg  q4h PRN, chloraseptic spray PRN, orajel PRN - Zofran  PRN for nausea with administration of pain meds - Plan for OP dentistry follow up for source control once pain improves

## 2024-04-15 NOTE — Hospital Course (Signed)
 [name] is a 63 y.o.female with a history of HTN and HLD  who was admitted to the [facility] Medicine Teaching Service at [facility] for IV abx and pain control for right facial swelling and pain 2/2 right upper dental abscess.Her hospital course is detailed below:  Dentoalveolar abscess, dental infection, and facial swelling  Patient admitted from [facility] standalone ED for dental abscess on 8/26. Prior to this she was prescribed amoxicillin  by her dentist with plan for outpatient root canal. She took three doses amoxicillin  without improvement before coming to the ED. Inpatient CT scan confirmed right maxillary second premolar periapical abscess with overlying 13 x 3 mm subperiosteal abscess along the buccal surface of the maxilla and regional soft tissue swelling/cellulitis without deep space infection. She had an I&D done by ENT here with no complications. She was treated with IV Unasyn  and Vancomycin  (8/26-8/27) and transitioned to PO Augmentin  and Bactrim  (8/28-9/01) when she improved clinically. Inpatient, she was given Tylenol  PRN, Oxycodone  PRN, and and chloraseptic spray PRN for pain and Zofran  each time as patient could not tolerate these PO without antiemetics. She was discharged medically stable with improved pain and swelling with outpatient follow up with dentist for source control. Prescribed Diflucan  to prevent her history of yeast infection with antibiotic use.  Other chronic conditions were medically managed with home medications and formulary alternatives as necessary (HTN, HLD. Anxiety/depression, neuropathic pain)  PCP Follow-up Recommendations: Dentist follow up Follow up on abscess cultures, if it is growing Actinomyces may need 6 months of antibiotics

## 2024-04-15 NOTE — Progress Notes (Addendum)
 Daily Progress Note Intern Pager: [phone]  Patient name: [name] Medical record number: [id number] Date of birth: 1961/01/20 Age: 63 y.o. Gender: female  Primary Care Provider: Nicholas Bar, MD Consultants: ENT Code Status: Full  Pt Overview and Major Events to Date:  8/26 - admitted, I&D R maxillary dentoalveolar abscess by ENT  Assessment and Plan: [name] is a 63 y.o. female with a PMHx HLD, HTN, anxiety/depression, neuropathy who presented with dental abscess, now s/p I&D by ENT receiving IV antibiotics.  Assessment & Plan Dental infection Facial swelling Dentoalveolar abscess Improved after I&D - IV Unasyn  q8h (8/26-) - IV vancomycin  q24h (8/26-) - Will plan to switch to PO tomorrow 8/28 per ENT recs - F/u abscess culture - Blood culture no growth <24hrs - Pain regimen: Tylenol  1000mg  q6h PRN, oxycodone  5mg  q4h PRN, chloraseptic spray PRN, orajel PRN - Zofran  PRN for nausea with administration of pain meds - Plan for OP dentistry follow up for source control once pain improves Chronic health problem HTN: continue home losartan   HLD: continue home atorvastatin  Anxiety/depression: continue home Wellbutrin  and venlafaxine  Neuropathic pain: continue home gabapentin  300mg  TID  FEN/GI: regular diet PPx: Lovenox  Dispo:Home pending clinical improvement . Barriers include IV abx  Subjective:  Patient reports right facial swelling has improved. States that after I&D yesterday that pain and swelling has improved. Does endorse continuing outer maxillary pain rated 5/10 improved from 7/10 with current inpatient pain PRN meds. Patient reports having to take zofran  whenever she takes these pain medications PO. Patient notes improved sleep last night with gabapentin  and melatonin. She notes usually taking gabapentin , melatonin, and tylenol  PM at home with improved sleep. Patient denies drainage, chills, radiation of pain, difficulty swallowing, SOB, myalgias,  and any other associated symptoms.  Objective: Temp:  [97.7 F (36.5 C)-99.5 F (37.5 C)] 98.4 F (36.9 C) (08/27 0800) Pulse Rate:  [77-95] 83 (08/27 0800) Resp:  [16-18] 18 (08/27 0800) BP: (126-169)/(64-80) 146/70 (08/27 0800) SpO2:  [92 %-97 %] 93 % (08/27 0800) Weight:  [129.7 kg] 129.7 kg (08/26 1623) Physical Exam: General: Sitting at edge of bed in NAD. Conversational. HEENT: Mild TTP on right cheek, mild right sided facial swelling, improved from yesterday per photos. Oropharynx clear without eyrthema or exudate.  Cardiovascular: RRR, no murmurs Respiratory: CTAB, no increased work of breathing Abdomen: No TTP, soft, non-distended Extremities: No deformities, no rashes  Laboratory: Most recent CBC Lab Results  Component Value Date   WBC 10.4 04/15/2024   HGB 12.9 04/15/2024   HCT 38.8 04/15/2024   MCV 93.7 04/15/2024   PLT 190 04/15/2024   Most recent BMP    Latest Ref Rng & Units 04/15/2024    4:25 AM  BMP  Glucose 70 - 99 mg/dL 869   BUN 8 - 23 mg/dL 10   Creatinine 9.55 - 1.00 mg/dL 9.10   Sodium 864 - 854 mmol/L 138   Potassium 3.5 - 5.1 mmol/L 4.4   Chloride 98 - 111 mmol/L 104   CO2 22 - 32 mmol/L 24   Calcium  8.9 - 10.3 mg/dL 9.1     Imaging/Diagnostic Tests: Previous this encounter CT Neck with contrast Impression from radiologist: 1. Right maxillary second premolar periapical abscess with overlying 13 x 3 mm subperiosteal abscess along the buccal surface of the maxilla and regional soft tissue swelling/cellulitis.    CT Soft Tissue Neck w/contrast  Impression from radiologist: 1. Right maxillary second premolar periapical abscess with overlying 13 x  3 mm subperiosteal abscess along the buccal surface of the maxilla and regional soft tissue swelling/cellulitis. Wilburt Gwenn Bernida MARLA, Medical Student 04/15/2024, 8:51 AM  AI, [facility] FPTS Intern pager: [phone], text pages welcome Secure chat group [facility] Teaching Service   I was personally present and performed or re-performed the history, physical exam and medical decision making activities of this service and have verified that the service and findings are accurately documented in the student's note.  Mintie Witherington, DO                  04/15/2024, 12:31 PM

## 2024-04-15 NOTE — Assessment & Plan Note (Addendum)
 HTN: continue home losartan   HLD: continue home atorvastatin  Anxiety/depression: continue home Wellbutrin  and venlafaxine  Neuropathic pain: continue home gabapentin  300mg  TID

## 2024-04-15 NOTE — Plan of Care (Signed)

## 2024-04-15 NOTE — Discharge Instructions (Addendum)
 Dear [name],   Thank you for letting us  participate in your care!  You were admitted to the hospital to receive IV antibiotics for dental abscess.  ENT drained the abscess.  You are being discharged with Augmentin  and Bactrim .  Please establish care ASAP with a dentist to get a root canal vs tooth extraction.    POST-HOSPITAL & CARE INSTRUCTIONS We recommend following up with your PCP within 1 week from being discharged from the hospital. Please let PCP/Specialists know of any changes in medications that were made which you will be able to see in the medications section of this packet.  DOCTOR'S APPOINTMENTS & FOLLOW UP No future appointments.   Thank you for choosing [facility]! Take care and be well!  Family Medicine Teaching Service Inpatient Team [place]  [facility]  [address] [place], [place] [address] [phone]

## 2024-04-16 ENCOUNTER — Other Ambulatory Visit (HOSPITAL_COMMUNITY): Payer: Self-pay

## 2024-04-16 DIAGNOSIS — K047 Periapical abscess without sinus: Secondary | ICD-10-CM | POA: Diagnosis not present

## 2024-04-16 DIAGNOSIS — R220 Localized swelling, mass and lump, head: Secondary | ICD-10-CM | POA: Diagnosis not present

## 2024-04-16 MED ORDER — AMOXICILLIN-POT CLAVULANATE 875-125 MG PO TABS
1.0000 | ORAL_TABLET | Freq: Two times a day (BID) | ORAL | 0 refills | Status: AC
  Filled 2024-04-16: qty 8, 4d supply, fill #0

## 2024-04-16 MED ORDER — ONDANSETRON 4 MG PO TBDP
4.0000 mg | ORAL_TABLET | Freq: Three times a day (TID) | ORAL | 0 refills | Status: AC | PRN
  Filled 2024-04-16: qty 10, 4d supply, fill #0

## 2024-04-16 MED ORDER — CHLORHEXIDINE GLUCONATE 0.12 % MT SOLN
15.0000 mL | Freq: Two times a day (BID) | OROMUCOSAL | 0 refills | Status: AC
Start: 2024-04-16 — End: ?
  Filled 2024-04-16: qty 473, 16d supply, fill #0

## 2024-04-16 MED ORDER — SULFAMETHOXAZOLE-TRIMETHOPRIM 800-160 MG PO TABS
1.0000 | ORAL_TABLET | Freq: Two times a day (BID) | ORAL | 0 refills | Status: AC
  Filled 2024-04-16: qty 8, 4d supply, fill #0

## 2024-04-16 MED ORDER — OXYCODONE HCL 5 MG PO TABS
5.0000 mg | ORAL_TABLET | Freq: Four times a day (QID) | ORAL | 0 refills | Status: AC | PRN
  Filled 2024-04-16: qty 20, 5d supply, fill #0

## 2024-04-16 MED ORDER — FLUCONAZOLE 150 MG PO TABS
150.0000 mg | ORAL_TABLET | Freq: Once | ORAL | 0 refills | Status: AC
  Filled 2024-04-16: qty 1, 1d supply, fill #0

## 2024-04-16 MED ORDER — BENZOCAINE 10 % MT GEL
Freq: Two times a day (BID) | OROMUCOSAL | 0 refills | Status: AC | PRN
  Filled 2024-04-16: qty 5.3, fill #0

## 2024-04-16 NOTE — TOC Transition Note (Signed)
 Transition of Care [facility]) - Discharge Note   Patient Details  Name: [name] MRN: [id number] Date of Birth: 04-12-1961  Transition of [facility]) CM/SW Contact:  Waddell Barnie Rama, RN Phone Number: 04/16/2024, 12:18 PM   Clinical Narrative:    For dc today, no needs.         Patient Goals and CMS Choice            Discharge Placement                       Discharge Plan and Services Additional resources added to the After Visit Summary for                                       Social Drivers of Health (SDOH) Interventions SDOH Screenings   Food Insecurity: Patient Declined (04/14/2024)  Housing: Unknown (04/14/2024)  Transportation Needs: Patient Declined (04/14/2024)  Utilities: Patient Declined (04/14/2024)  Depression (PHQ2-9): Medium Risk (08/23/2023)  Social Connections: Unknown (04/14/2024)  Tobacco Use: Low Risk  (04/14/2024)     Readmission Risk Interventions     No data to display

## 2024-04-16 NOTE — Assessment & Plan Note (Addendum)
 Improved after I&D - IV Unasyn  q8h (8/26-8/28) - IV vancomycin  q24h (8/26-8/28) - Start Bactrim /Augmentin  to complete 4 additional days for total 7-day course of antibiotics (8/28 - 8/31) - Diflucan  for history of yeast infection with antibiotic use - Pain regimen: Tylenol  1000mg  q6h PRN, oxycodone  5mg  q4h PRN, chloraseptic spray PRN, orajel PRN - Zofran  PRN for nausea with administration of pain meds - Plan for OP dentistry-patient plan to schedule appointment this morning.

## 2024-04-16 NOTE — Progress Notes (Addendum)
     Daily Progress Note Intern Pager: [phone]  Patient name: [name] Medical record number: [id number] Date of birth: 1960-12-19 Age: 63 y.o. Gender: female  Primary Care Provider: Nicholas Bar, MD Consultants: ENT Code Status: Full  Pt Overview and Major Events to Date:  8/26 - admitted, I&D R maxillary dentoalveolar abscess by ENT   Assessment and Plan: [name] is a 63 y.o. female who presented with dental abscess, now s/p I&D with ENT and IV products.  Plan to transition to p.o. antibiotics today, discharge with close dentistry follow-up. Assessment & Plan Dental infection Facial swelling Periapical abscess Dentoalveolar abscess Improved after I&D - IV Unasyn  q8h (8/26-8/28) - IV vancomycin  q24h (8/26-8/28) - Start Bactrim /Augmentin  to complete 4 additional days for total 7-day course of antibiotics (8/28 - 8/31) - Diflucan  for history of yeast infection with antibiotic use - Pain regimen: Tylenol  1000mg  q6h PRN, oxycodone  5mg  q4h PRN, chloraseptic spray PRN, orajel PRN - Zofran  PRN for nausea with administration of pain meds - Plan for OP dentistry-patient plan to schedule appointment this morning. Chronic health problem HTN: continue home losartan   HLD: continue home atorvastatin  Anxiety/depression: continue home Wellbutrin  and venlafaxine  Neuropathic pain: continue home gabapentin  300mg  TID  FEN/GI: Regular diet PPx: Lovenox  Dispo:Home pending clinical improvement . Barriers include IV abx  Subjective:  Patient reports improved facial pain and swelling. Pain is worsened by palpation and chewing. Patient requests diflucan  for history of yeast infection with antibiotics. She denies drainage, difficulty swallowing, fever, chills, other swelling, and any other symptoms.  Objective: Temp:  [97.5 F (36.4 C)-98.4 F (36.9 C)] 98 F (36.7 C) (08/28 0332) Pulse Rate:  [63-83] 63 (08/28 0332) Resp:  [18-20] 18 (08/28 0332) BP: (123-159)/(52-72)  123/52 (08/28 0332) SpO2:  [93 %-98 %] 97 % (08/28 0332) Physical Exam: General: Sitting at edge of bed, in NAD, finishing up breakfast HEENT: Mild right upper cheek edema. I&D site healing, no purulence, drainage, redness, or warmth. Cardiovascular: RRR, no murmurs Respiratory: CTAB, breathing comfortably on room air Abdomen: No TTP, soft, non-distended Extremities: No deformities, rashes, or edema  Laboratory: Most recent CBC Lab Results  Component Value Date   WBC 10.4 04/15/2024   HGB 12.9 04/15/2024   HCT 38.8 04/15/2024   MCV 93.7 04/15/2024   PLT 190 04/15/2024   Most recent BMP    Latest Ref Rng & Units 04/15/2024    4:25 AM  BMP  Glucose 70 - 99 mg/dL 869   BUN 8 - 23 mg/dL 10   Creatinine 9.55 - 1.00 mg/dL 9.10   Sodium 864 - 854 mmol/L 138   Potassium 3.5 - 5.1 mmol/L 4.4   Chloride 98 - 111 mmol/L 104   CO2 22 - 32 mmol/L 24   Calcium  8.9 - 10.3 mg/dL 9.1    Imaging/Diagnostic Tests: Previous this encounter CT Neck with contrast Impression from radiologist: 1. Right maxillary second premolar periapical abscess with overlying 13 x 3 mm subperiosteal abscess along the buccal surface of the maxilla and regional soft tissue swelling/cellulitis.   CT Soft Tissue Neck w/contrast  Impression from radiologist: 1. Right maxillary second premolar periapical abscess with overlying 13 x 3 mm subperiosteal abscess along the buccal surface of the maxilla and regional soft tissue swelling/cellulitis. [name], Medical Student 04/16/2024, 7:22 AM  [name] FPTS Intern pager: [phone], text pages welcome Secure chat group [facility] Teaching Service

## 2024-04-16 NOTE — Discharge Summary (Addendum)
 Family Medicine Teaching [facility] Discharge Summary  Patient name: [name] Medical record number: [id number] Date of birth: 08-01-1961 Age: 63 y.o. Gender: female Date of Admission: 04/14/2024  Date of Discharge: 8/28 Admitting Physician: Lonni SHAUNNA Dalton, MD  Primary Care Provider: Nicholas Bar, MD Consultants: ENT  Indication for Hospitalization:   Discharge Diagnoses/Problem List:  Principal Problem for Admission: Dental infection and abscess Other Problems addressed during stay:  Principal Problem:   Dental infection Active Problems:   Facial swelling   Chronic health problem   Periapical abscess   Dentoalveolar abscess  The above problem list has been updated and reviewed for accuracy, including the initial reason for admission.  Brief Hospital Course:  [name] is a 63 y.o.female with a history of HTN and HLD  who was admitted to the [facility] Medicine Teaching Service at [facility] for IV abx and pain control for right facial swelling and pain 2/2 right upper dental abscess.Her hospital course is detailed below:  Dentoalveolar abscess, dental infection, and facial swelling  Patient admitted from [facility] standalone ED for dental abscess on 8/26. Prior to this she was prescribed amoxicillin  by her dentist with plan for outpatient root canal. She took three doses amoxicillin  without improvement before coming to the ED. Inpatient CT scan confirmed right maxillary second premolar periapical abscess with overlying 13 x 3 mm subperiosteal abscess along the buccal surface of the maxilla and regional soft tissue swelling/cellulitis without deep space infection. She had an I&D done by ENT here with no complications. She was treated with IV Unasyn  and Vancomycin  (8/26-8/27) and transitioned to PO Augmentin  and Bactrim  (8/28-9/01) when she improved clinically. Inpatient, she was given Tylenol  PRN, Oxycodone  PRN, and and chloraseptic spray PRN for pain and Zofran   each time as patient could not tolerate these PO without antiemetics. She was discharged medically stable with improved pain and swelling with outpatient follow up with dentist for source control. Prescribed Diflucan  to prevent her history of yeast infection with antibiotic use.  Other chronic conditions were medically managed with home medications and formulary alternatives as necessary (HTN, HLD. Anxiety/depression, neuropathic pain)  PCP Follow-up Recommendations: Dentist follow up Follow up on abscess cultures, if it is growing Actinomyces may need 6 months of antibiotics  Results/Tests Pending at Time of Discharge:  Unresulted Labs (From admission, onward)    None      Disposition: Home  Discharge Condition: Stable  Discharge Exam:  Vitals:   04/16/24 0332 04/16/24 0843  BP: (!) 123/52 (!) 118/53  Pulse: 63 70  Resp: 18   Temp: 98 F (36.7 C) 98 F (36.7 C)  SpO2: 97% 95%    Significant Procedures: Dental abscess I&D  Significant Labs and Imaging:  Recent Labs  Lab 04/15/24 0425  WBC 10.4  HGB 12.9  HCT 38.8  PLT 190   Recent Labs  Lab 04/15/24 0425  NA 138  K 4.4  CL 104  CO2 24  GLUCOSE 130*  BUN 10  CREATININE 0.89  CALCIUM  9.1   Pertinent Imaging CT Soft Tissue Neck w/contrast  Impression from radiologist: 1. Right maxillary second premolar periapical abscess with overlying 13 x 3 mm subperiosteal abscess along the buccal surface of the maxilla and regional soft tissue swelling/cellulitis.   Discharge Medications:  Allergies as of 04/16/2024       Reactions   Allopurinol  Other (See Comments)   Caused hair to fall out   Lubiprostone Nausea Only    Amitiza   Naproxen  Hives   Other Nausea And Vomiting   This occurs with ANY ANESTHESIA or NARCOTICS   Oxycodone  Nausea Only   Can tolerate with Zofran         Medication List     STOP taking these medications    amoxicillin  500 MG capsule Commonly known as: AMOXIL    baclofen  10 MG  tablet Commonly known as: LIORESAL    diphenhydramine-acetaminophen  25-500 MG Tabs tablet Commonly known as: TYLENOL  PM   fexofenadine  180 MG tablet Commonly known as: Allegra  Allergy   mirabegron  ER 25 MG Tb24 tablet Commonly known as: MYRBETRIQ    nystatin  powder Commonly known as: MYCOSTATIN /NYSTOP        TAKE these medications    albuterol  108 (90 Base) MCG/ACT inhaler Commonly known as: VENTOLIN  HFA Inhale 2 puffs into the lungs every 6 (six) hours as needed for wheezing or shortness of breath.   amoxicillin -clavulanate 875-125 MG tablet Commonly known as: AUGMENTIN  Take 1 tablet by mouth 2 (two) times daily for 4 days.   atorvastatin  40 MG tablet Commonly known as: LIPITOR  TAKE 2 TABLETS BY MOUTH EVERY DAY   benzocaine  10 % mucosal gel Commonly known as: ORAJEL Use as directed in the mouth or throat 2 (two) times daily as needed for mouth pain.   buPROPion  150 MG 24 hr tablet Commonly known as: WELLBUTRIN  XL Take 1 tablet (150 mg total) by mouth 2 (two) times daily.   chlorhexidine  0.12 % solution Commonly known as: PERIDEX  Swish and spit 15 mLs in the mouth or throat 2 (two) times daily for 4 days. Discard remainder   FISH OIL PO Take 2 tablets by mouth daily.   fluconazole  150 MG tablet Commonly known as: Diflucan  Take 1 tablet (150 mg total) by mouth once for 1 dose. After you finish your antibiotic course   fluticasone  50 MCG/ACT nasal spray Commonly known as: FLONASE  Place 2 sprays into both nostrils daily.   gabapentin  300 MG capsule Commonly known as: NEURONTIN  Take 1 capsule (300 mg total) by mouth 3 (three) times daily.   HAIR SKIN & NAILS PO Take 3 tablets by mouth daily.   losartan  25 MG tablet Commonly known as: COZAAR  Take 1 tablet (25 mg total) by mouth at bedtime.   Melatonin 10 MG Tabs Take 2 tablets by mouth at bedtime.   metFORMIN  500 MG 24 hr tablet Commonly known as: GLUCOPHAGE -XR TAKE 2 TABLETS (1,000 MG TOTAL) BY MOUTH 2  (TWO) TIMES DAILY WITH A MEAL. START WITH ONE TABLET TWICE DAILY. IF TOLERATING WELL, INCREASE TO TWO TABLETS TWICE DAILY.   ondansetron  4 MG disintegrating tablet Commonly known as: ZOFRAN -ODT Take 1 tablet (4 mg total) by mouth every 8 (eight) hours as needed for nausea or vomiting. What changed: Another medication with the same name was removed. Continue taking this medication, and follow the directions you see here.   oxyCODONE  5 MG immediate release tablet Commonly known as: Oxy IR/ROXICODONE  Take 1 tablet (5 mg total) by mouth every 6 (six) hours as needed for up to 5 days for severe pain (pain score 7-10).   polyethylene glycol powder 17 GM/SCOOP powder Commonly known as: GLYCOLAX /MIRALAX  Take 17 g by mouth 2 (two) times daily as needed. What changed: reasons to take this   sulfamethoxazole -trimethoprim  800-160 MG tablet Commonly known as: Bactrim  DS Take 1 tablet by mouth 2 (two) times daily for 4 days.   venlafaxine  XR 37.5 MG 24 hr capsule Commonly known as: EFFEXOR -XR TAKE 1 CAPSULE BY MOUTH DAILY WITH BREAKFAST.  VITAMIN B-COMPLEX PO Take 1 tablet by mouth daily.   VITAMIN D PO Take 1 tablet by mouth daily.        Discharge Instructions: Please refer to Patient Instructions section of EMR for full details.  Patient was counseled important signs and symptoms that should prompt return to medical care, changes in medications, dietary instructions, activity restrictions, and follow up appointments.   Follow-Up Appointments:   Wilburt Gwenn Bernida MARLA, Medical Student 04/16/2024, 3:05 PM AI, [facility]   I was personally present and performed or re-performed the history, physical exam and medical decision making activities of this service and have verified that the service and findings are accurately documented in the student's note.  Gladis Church, DO                  04/16/2024, 3:31 PM

## 2024-04-16 NOTE — Assessment & Plan Note (Addendum)
 HTN: continue home losartan   HLD: continue home atorvastatin  Anxiety/depression: continue home Wellbutrin  and venlafaxine  Neuropathic pain: continue home gabapentin  300mg  TID

## 2024-04-17 LAB — AEROBIC/ANAEROBIC CULTURE W GRAM STAIN (SURGICAL/DEEP WOUND)
Culture: NORMAL
Gram Stain: NONE SEEN

## 2024-04-19 LAB — CULTURE, BLOOD (ROUTINE X 2)
Culture: NO GROWTH
Culture: NO GROWTH
Special Requests: ADEQUATE

## 2024-04-21 ENCOUNTER — Ambulatory Visit: Payer: Self-pay | Admitting: Family Medicine

## 2024-04-24 ENCOUNTER — Ambulatory Visit (INDEPENDENT_AMBULATORY_CARE_PROVIDER_SITE_OTHER): Payer: Self-pay | Admitting: Student

## 2024-04-24 ENCOUNTER — Encounter: Payer: Self-pay | Admitting: Student

## 2024-04-24 ENCOUNTER — Ambulatory Visit: Payer: Self-pay | Admitting: Student

## 2024-04-24 VITALS — BP 147/74 | HR 93 | Temp 99.9°F | Ht 66.0 in | Wt 281.6 lb

## 2024-04-24 DIAGNOSIS — J029 Acute pharyngitis, unspecified: Secondary | ICD-10-CM | POA: Diagnosis not present

## 2024-04-24 LAB — POC SOFIA SARS ANTIGEN FIA: SARS Coronavirus 2 Ag: NEGATIVE

## 2024-04-24 NOTE — Progress Notes (Signed)
    SUBJECTIVE:   CHIEF COMPLAINT / HPI:   Hospital follow-up Patient presents for hospital follow-up.  Admitted for dental abscess.  This was drained by ENT, however ultimately she needs a root canal.  She completed IV antibiotic and p.o. antibiotic course.  Since discharge, she has been doing well.  She has not seen the dentist, due to $1800 cost of root canal.  Sore throat Patient developed chills, body aches, sore throat, anterior cervical lymph node swelling on Wednesday.  She works as a Engineer, civil (consulting) at [employer], was exposed to some people have been sick.  No cough, NVD.  No recorded fevers, currently taking ibuprofen and Tylenol .  Although she has odynophagia, she does not have any dysphagia.  OBJECTIVE:   BP (!) 147/74   Pulse 93   Temp 99.9 F (37.7 C)   Ht 5' 6 (1.676 m)   Wt 281 lb 9.6 oz (127.7 kg)   SpO2 96%   BMI 45.45 kg/m    General: NAD, pleasant HEENT: Normocephalic, atraumatic head. Normal external ear, canal, TM bilaterally. EOM intact and normal conjunctiva BL. Normal external nose.  Erythematous, edematous tonsils, postnasal drip.  Reassuringly, abscess which was previously seen during her hospitalization is no longer present.  There are multiple dental caries throughout.  Positive anterior cervical adenopathy. Cardio: RRR, no MRG. Respiratory: CTAB, normal wob on RA Skin: Warm and dry  ASSESSMENT/PLAN:   Assessment & Plan Sore throat Suspect viral URI. COVID swab negative. Low concern for recurrence of abscess, however strict return precautions discussed.  Reviewed prior blood cultures, reassuringly negative.  Patient looks unwell, but not septic appearing-again discussed importance of strict ED precautions given recent abscess. - Supportive care measures discussed - Strict precautions provided  Anne Church, DO [facility]

## 2024-04-24 NOTE — Patient Instructions (Signed)
 Upper respiratory tract infections cause symptoms such as nasal congestion, runny nose, sore throat, cough, and general malaise. Typically these are viral.  Here are some evidence-based recommendations for managing the common cold at home:  Over-the-Counter Medications 1. Pain Relievers: Acetaminophen  (Tylenol ) or nonsteroidal anti-inflammatory drugs (NSAIDs) like ibuprofen (Advil) can help reduce fever, sore throat, and body aches. 2. Decongestants: Pseudoephedrine  (Sudafed) and phenylephrine  can help relieve nasal congestion. These should be used with caution and not for more than three days to avoid rebound congestion. Please do not use these medications if you have high blood pressure or a heart condition. DO NOT USE IN CHILDREN. 3. Antihistamines: Like Zyrtec , combined with decongestants can modestly improve symptoms in adults. Do not take Claritin -D if you have a heart condition. 4. Cough Suppressants: Dextromethorphan may help reduce cough in adults, but its effectiveness in children is not well-supported. DO NOT USE IN CHILDREN without speaking with your doctor first. 5. Zinc: Zinc lozenges or supplements taken within 24 hours of symptom onset may reduce the duration of cold symptoms.  Non-Medication Remedies 1. Hydration: Drink plenty of fluids like water , herbal teas, and broths to stay hydrated and help thin mucus. 2. Rest: Ensure adequate rest to help your body fight off the infection. 3. Humidified Air: Using a humidifier or taking steamy showers can help relieve nasal congestion and soothe irritated airways. 4. Nasal Saline Irrigation: Rinsing the nasal passages with saline solution can help clear mucus and relieve congestion. 5. Honey: For children over one year old (do not use in children under one year old), honey can help soothe a sore throat and reduce coughing. 6. Vapor Rub: Applying a mentholated chest rub can help relieve cough and congestion, especially in  children.  Prevention Tips 1. Hand Hygiene: Regular hand washing with soap and water  can help prevent the spread of cold viruses. 2. Avoid Close Contact: Stay away from individuals who are sick to reduce the risk of catching a cold.  When to See a Doctor  If symptoms persist for more than 10 days.  If you experience high fever, shortness of breath, or severe headache.  If you have underlying health conditions that may complicate a cold.  Remember, antibiotics are not effective against viruses. Always consult with a healthcare provider before starting any new medication, especially for children.

## 2024-07-06 ENCOUNTER — Other Ambulatory Visit: Payer: Self-pay | Admitting: Family Medicine

## 2024-07-09 ENCOUNTER — Other Ambulatory Visit: Payer: Self-pay | Admitting: Family Medicine

## 2024-07-09 ENCOUNTER — Encounter: Payer: Self-pay | Admitting: Family Medicine

## 2024-07-09 ENCOUNTER — Ambulatory Visit: Payer: Self-pay | Admitting: Family Medicine

## 2024-07-09 ENCOUNTER — Ambulatory Visit: Attending: Family Medicine

## 2024-07-09 ENCOUNTER — Other Ambulatory Visit (HOSPITAL_COMMUNITY): Payer: Self-pay

## 2024-07-09 ENCOUNTER — Ambulatory Visit: Admitting: Family Medicine

## 2024-07-09 VITALS — BP 144/79 | HR 71 | Temp 98.1°F | Ht 66.0 in | Wt 281.2 lb

## 2024-07-09 DIAGNOSIS — M25512 Pain in left shoulder: Secondary | ICD-10-CM

## 2024-07-09 DIAGNOSIS — G4733 Obstructive sleep apnea (adult) (pediatric): Secondary | ICD-10-CM

## 2024-07-09 DIAGNOSIS — E1169 Type 2 diabetes mellitus with other specified complication: Secondary | ICD-10-CM | POA: Diagnosis not present

## 2024-07-09 DIAGNOSIS — R0609 Other forms of dyspnea: Secondary | ICD-10-CM | POA: Diagnosis not present

## 2024-07-09 DIAGNOSIS — Z23 Encounter for immunization: Secondary | ICD-10-CM

## 2024-07-09 DIAGNOSIS — R7303 Prediabetes: Secondary | ICD-10-CM

## 2024-07-09 DIAGNOSIS — I10 Essential (primary) hypertension: Secondary | ICD-10-CM

## 2024-07-09 DIAGNOSIS — R002 Palpitations: Secondary | ICD-10-CM

## 2024-07-09 DIAGNOSIS — G8929 Other chronic pain: Secondary | ICD-10-CM

## 2024-07-09 DIAGNOSIS — E785 Hyperlipidemia, unspecified: Secondary | ICD-10-CM

## 2024-07-09 DIAGNOSIS — Z1231 Encounter for screening mammogram for malignant neoplasm of breast: Secondary | ICD-10-CM

## 2024-07-09 LAB — POCT GLYCOSYLATED HEMOGLOBIN (HGB A1C): HbA1c, POC (controlled diabetic range): 5.9 % (ref 0.0–7.0)

## 2024-07-09 MED ORDER — ZEPBOUND 2.5 MG/0.5ML ~~LOC~~ SOAJ: 2.5000 mg | SUBCUTANEOUS | 0 refills | Status: DC

## 2024-07-09 NOTE — Progress Notes (Unsigned)
 EP to read.

## 2024-07-09 NOTE — Progress Notes (Addendum)
 SUBJECTIVE:   CHIEF COMPLAINT / HPI:  Discussed the use of AI scribe software for clinical note transcription with the patient, who gave verbal consent to proceed.  History of Present Illness [name] is a 63 year old female who presents with tiredness, shortness of breath, and left shoulder pain.  Dyspnea and fatigue - Shortness of breath with minimal exertion, including walking short distances and climbing stairs - Progressive worsening since the summer - Impacts daily activities - No chest pain, but does feel pain going down her left arm when she exerts herself.  Patient was attributing this to her weight and deconditioning, thus did not mention it before.  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   Sleep disturbance and sleep apnea - Severe sleep apnea diagnosed previously with AHI of >50  - Not approved for CPAP therapy - Dry mouth upon waking - Heavy snoring - Poor sleep quality - Mouth guard trialed without improvement  Weight management and diabetes - Concerned about weight - Engages in walking and dietary modifications - Taking metformin  and has had prediabetes  - Used to be on tirzepitide which was helpful     PERTINENT  PMH / PSH: OSA, Prediabetes, HTN, 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 CARDIOVASCULAR: Heart sounds normal.  General: well appearing, in no acute distress CV: RRR, radial pulses equal and palpable, no BLE edema  Resp: Normal work of breathing on room air, CTAB Abd: Soft, non tender, non distended  Neuro: Alert & Oriented x 4  MSK: L shoulder Limited passive range of motion, cannot raise above 100 degrees, limited active range of motion above 100 degrees, no obvious asymmetry, no skin changes,  No tenderness to palpation along AC joint Positive neer's test, positive lift off test   ASSESSMENT/PLAN:   Assessment & Plan Dyspnea on minimal exertion Dyspnea on exertion with associated left arm pain concerning for possible cardiac etiology or pulmonary hypertension given history of untreated OSA. Ddx also includes OHS and deconditioning, but associated palpitations is concerning as well. No tobacco use history. No pleuritic pain  - BNP, Echo  - referral to cardiology for possible stress test  Palpitations DDX includes arhythmia, anxiety.  - Zio monitor  - TSH, CBC, BMP  - Refer to cardiology as above  Chronic left shoulder pain Most likely has additional MSK shoulder etiology such as OA or adhesive capsulitis.  - Shoulder xray  Obstructive sleep apnea (adult) (pediatric) Severe OSA history but had difficulty with insurance for obtaining CPAP. Most likely contributing to her HTN and other co morbidities.  - Referral to sleep medicine for assistance in obtaining CPAP  - Restart zepbound   Prediabetes Previous A1c prediabetic range.  - Recheck A1c, continue metformin   HYPERTENSION, BENIGN SYSTEMIC Uncontrolled. Has not taken medication for a few days.  - Refilled medications and counseled on adherence  - Referral for sleep medicine for OSA management.    Follow up in 1-2 weeks   Areta Saliva, MD [facility] [place]

## 2024-07-09 NOTE — Patient Instructions (Signed)
 It was wonderful to see you today.  Please bring ALL of your medications with you to every visit.   Today we talked about:  Shortness of breath - this could be related to a lot of things. Because it has been going on for a long time, I want to make sure your heart is not the reason especially since you are experiencing palpitations and pain going down your left arm as well.  I am getting an ultrasound of your heart and lots of labs.   I have also ordered a zio monitor that should come to your house and you mail it back to them.   I have referred you to sleep medicine to see if they can get you set up with a CPAP. I have also restarted your zepbound . Hopefully they should cover it given your sleep apnea is severe.   You are also due for a colonoscopy, pap smear, and a lot of other vaccines. We will need to schedule an annual physical soon.  I ordered a shoulder xray you can get this done without an appointment.   Please follow up in 2 weeks.   Thank you for choosing [facility].   Please call [phone] with any questions about today's appointment.  Please be sure to schedule follow up at the front desk before you leave today.   Areta Saliva, MD  Family Medicine

## 2024-07-10 ENCOUNTER — Other Ambulatory Visit: Payer: Self-pay | Admitting: Family Medicine

## 2024-07-10 DIAGNOSIS — G8929 Other chronic pain: Secondary | ICD-10-CM

## 2024-07-10 LAB — TSH RFX ON ABNORMAL TO FREE T4: TSH: 0.547 u[IU]/mL (ref 0.450–4.500)

## 2024-07-10 NOTE — Assessment & Plan Note (Signed)
 Previous A1c prediabetic range.  - Recheck A1c, continue metformin 

## 2024-07-10 NOTE — Assessment & Plan Note (Signed)
 Severe OSA history but had difficulty with insurance for obtaining CPAP. Most likely contributing to her HTN and other co morbidities.  - Referral to sleep medicine for assistance in obtaining CPAP  - Restart zepbound 

## 2024-07-10 NOTE — Assessment & Plan Note (Signed)
 Uncontrolled. Has not taken medication for a few days.  - Refilled medications and counseled on adherence  - Referral for sleep medicine for OSA management.

## 2024-07-11 LAB — CBC
Hematocrit: 41.3 % (ref 34.0–46.6)
Hemoglobin: 13.4 g/dL (ref 11.1–15.9)
MCH: 31.2 pg (ref 26.6–33.0)
MCHC: 32.4 g/dL (ref 31.5–35.7)
MCV: 96 fL (ref 79–97)
Platelets: 211 x10E3/uL (ref 150–450)
RBC: 4.29 x10E6/uL (ref 3.77–5.28)
RDW: 13.1 % (ref 11.7–15.4)
WBC: 4.3 x10E3/uL (ref 3.4–10.8)

## 2024-07-11 LAB — LIPID PANEL
Chol/HDL Ratio: 4.9 ratio — ABNORMAL HIGH (ref 0.0–4.4)
Cholesterol, Total: 231 mg/dL — ABNORMAL HIGH (ref 100–199)
HDL: 47 mg/dL (ref >39)
LDL Chol Calc (NIH): 161 mg/dL — ABNORMAL HIGH (ref 0–99)
Triglycerides: 129 mg/dL (ref 0–149)
VLDL Cholesterol Cal: 23 mg/dL (ref 5–40)

## 2024-07-11 LAB — BASIC METABOLIC PANEL WITH GFR
BUN/Creatinine Ratio: 13 (ref 12–28)
BUN: 12 mg/dL (ref 8–27)
CO2: 22 mmol/L (ref 20–29)
Calcium: 9.3 mg/dL (ref 8.7–10.3)
Chloride: 103 mmol/L (ref 96–106)
Creatinine, Ser: 0.9 mg/dL (ref 0.57–1.00)
Glucose: 96 mg/dL (ref 70–99)
Potassium: 4.4 mmol/L (ref 3.5–5.2)
Sodium: 141 mmol/L (ref 134–144)
eGFR: 71 mL/min/1.73 (ref >59)

## 2024-07-11 LAB — BRAIN NATRIURETIC PEPTIDE: BNP: 23.7 pg/mL (ref 0.0–100.0)

## 2024-07-13 NOTE — Addendum Note (Signed)
 Addended by: DONZETTA QUANT E on: 07/13/2024 01:59 PM   Modules accepted: Orders

## 2024-07-13 NOTE — Telephone Encounter (Signed)
 Called patient in regards to lab tests. Mostly normal or stable; however, cholesterol remains elevated. Patient says that she stopped atorvastatin  before and has not been takingit. I informed her she should take it to help with cholesterol and prevent CVD. She agreed.

## 2024-07-15 ENCOUNTER — Encounter: Payer: Self-pay | Admitting: Physician Assistant

## 2024-07-15 ENCOUNTER — Ambulatory Visit: Attending: Physician Assistant | Admitting: Physician Assistant

## 2024-07-15 VITALS — BP 168/98 | Ht 66.0 in | Wt 281.0 lb

## 2024-07-15 DIAGNOSIS — R0602 Shortness of breath: Secondary | ICD-10-CM

## 2024-07-15 DIAGNOSIS — R072 Precordial pain: Secondary | ICD-10-CM | POA: Diagnosis not present

## 2024-07-15 DIAGNOSIS — I10 Essential (primary) hypertension: Secondary | ICD-10-CM

## 2024-07-15 DIAGNOSIS — I700 Atherosclerosis of aorta: Secondary | ICD-10-CM

## 2024-07-15 DIAGNOSIS — E7800 Pure hypercholesterolemia, unspecified: Secondary | ICD-10-CM

## 2024-07-15 DIAGNOSIS — R002 Palpitations: Secondary | ICD-10-CM

## 2024-07-15 MED ORDER — ASPIRIN 81 MG PO TBEC: 81.0000 mg | DELAYED_RELEASE_TABLET | Freq: Every day | ORAL | Status: AC

## 2024-07-15 MED ORDER — AMLODIPINE BESYLATE 5 MG PO TABS: 5.0000 mg | ORAL_TABLET | Freq: Every day | ORAL | 3 refills | Status: AC

## 2024-07-15 MED ORDER — METOPROLOL TARTRATE 100 MG PO TABS: 100.0000 mg | ORAL_TABLET | Freq: Once | ORAL | 0 refills | Status: DC

## 2024-07-15 NOTE — Progress Notes (Signed)
 OFFICE NOTE:    Date:  07/15/2024  ID:  [name], DOB [birth year], MRN [id number] PCP: Nicholas Bar, MD  [facility] Providers Cardiologist:  None        Hypertension Hyperlipidemia  Prediabetes Obstructive sleep apnea Obesity Hepatic steatosis Gastroesophageal reflux disease (GERD) Aortic atherosclerosis FHx CAD         Discussed the use of AI scribe software for clinical note transcription with the patient, who gave verbal consent to proceed. History of Present Illness [name] is a 63 y.o. female referred by Anne Rollene BRAVO, MD for shortness of breath. She was seen by primary care on 07/09/24 with symptoms of shortness of breath with minimal exertion accompanied by left arm pain and palpitations.   She has been experiencing shortness of breath for several months, which occurs with exertion.  She has noted palpitations as well, especially when lying down at night. The shortness of breath occurs at half the distance she usually walks at work. She has no history of asthma or COPD. She also reports a chronic cough.  Lisinopril  was switched to losartan  due to cough. She has associated chest discomfort with her shortness of breath described as a pulling sensation in the center and left side of her chest, which occurs with exertion. There is no chest pain at rest, syncope, or significant weight gain. She prefers to sleep on incline.   Her past medical history includes hypertension, for which she was previously on losartan  but has been off for two weeks, by mistake. She also has hyperlipidemia, managed with atorvastatin , and prediabetes, managed with metformin . She has a history of sleep apnea, with insurance currently denying CPAP.    Family history is significant for heart disease, with her father having had a heart attack and congestive heart failure. Her mother had breast and liver cancer, and there is a family history of diabetes and cancer on both sides.   No family history of sudden cardiac death.  Socially, she does not smoke, drink alcohol, or use drugs. 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 She is not married and has three children.   ROS-See HPI     Studies Reviewed:  EKG Interpretation Date/Time:  Wednesday July 15 2024 14:52:26 EST Ventricular Rate:  72 PR Interval:  156 QRS Duration:  86 QT Interval:  414 QTC Calculation: 453 R Axis:   21  Text Interpretation: Normal sinus rhythm Normal ECG Confirmed by Lelon Hamilton [id number]) on 07/15/2024 2:58:59 PM    LABS Potassium: 4.4 (07/09/2024) Creatinine: 0.9 (07/09/2024) GFR: 71 (07/09/2024) ALT: 16 (04/14/2024) BNP: 23 (07/09/2024) Total cholesterol: 231 (07/09/2024) HDL: 47 (07/09/2024) LDL: 838 (07/09/2024) Triglycerides: 129 (07/09/2024) Hemoglobin: 13.4 (07/09/2024) Platelet count: 211,000 (07/09/2024) TSH: 0.547 (07/09/2024) A1c: 5.9 (07/09/2024)  RADIOLOGY Abdominal and pelvic CT: Hepatic steatosis, aortic atherosclerosis, mild cardiomegaly (03/22/2020)  DIAGNOSTIC EKG: Normal sinus rhythm, normal axis, QTc 429 ms, no acute ST-T wave changes (11/24/2018)  HYPERTENSION CONTROL Vitals:   07/15/24 1439 07/15/24 1523  BP: (!) 150/90 (!) 168/98    The patient's blood pressure is elevated above target today.  In order to address the patient's elevated BP: A new medication was prescribed today.         Physical Exam:  VS:  BP (!) 168/98   Ht 5' 6 (1.676 m)   Wt 281 lb (127.5 kg)   SpO2 99%   BMI 45.35 kg/m  Wt Readings from Last 3 Encounters:  07/15/24 281 lb (127.5 kg)  07/09/24 281 lb 4 oz (127.6 kg)  04/24/24 281 lb 9.6 oz (127.7 kg)    Constitutional:      Appearance: Healthy appearance. Not in distress.  Neck:     Vascular: No carotid bruit. JVD normal.  Pulmonary:     Breath sounds: Normal breath sounds. No wheezing. No rales.  Cardiovascular:     Normal rate.  Regular rhythm.     Murmurs: There is no murmur.  Edema:    Peripheral edema absent.  Abdominal:     Palpations: Abdomen is soft.       Assessment and Plan:    Assessment & Plan Shortness of breath Precordial chest pain New onset exertional chest pain and dyspnea over the last month, worsening in frequency and intensity.  Her symptoms sound cardiac in nature.  She has several risk factors including hypertension, hyperlipidemia, prediabetes, and family history of coronary artery disease.  She also has evidence of aortic atherosclerosis.  Her EKG today is normal.  - Order coronary CTA to rule out ischemic heart disease. - Give Metoprolol  tartrate 100 mg x 1 on day of CCTA - Echocardiogram has already been ordered by primary care to rule out structural heart disease - Follow up 3 mos Palpitations Primary care has ordered 2 week monitor. Will await results. Essential hypertension Blood pressure readings elevated at home. She has been off losartan  for two weeks. Her BPs at home were above goal even with the Losartan . Previous use of lisinopril  resulted in a cough.   - Resume losartan  25 mg once daily. - Start amlodipine  5 mg daily. Pure hypercholesterolemia Recent LDL cholesterol elevated at 161. If significant plaque or heavy calcium  burden is noted on CCTA, goal LDL will be at least less than 70, ideally less than 55. - Continue atorvastatin  80 mg daily. Aortic atherosclerosis Continue statin therapy.  Start aspirin  81 mg daily.        Dispo:  Return in about 3 months (around 10/15/2024) for Follow up after testing, w/ Glendia Ferrier, PA-C.  Signed, Glendia Ferrier, PA-C

## 2024-07-15 NOTE — Patient Instructions (Addendum)
 Medication Instructions:  Your physician has recommended you make the following change in your medication:   START Amlodipine  5 mg taking 1 daily  START Aspirin  81 mg taking 1 daily   *If you need a refill on your cardiac medications before your next appointment, please call your pharmacy*  Lab Work: None ordered  If you have labs (blood work) drawn today and your tests are completely normal, you will receive your results only by: MyChart Message (if you have MyChart) OR A paper copy in the mail If you have any lab test that is abnormal or we need to change your treatment, we will call you to review the results.  Testing/Procedures: Your physician has requested that you have cardiac CT. Cardiac computed tomography (CT) is a painless test that uses an x-ray machine to take clear, detailed pictures of your heart. For further information please visit [email or web address] Please follow instruction sheet  BELOW:    Your cardiac CT will be scheduled at one of the below locations:   [facility] [address] [place], [place] [address] [phone] (Severe contrast allergies only)  OR   [facility] [address] [place], [place] [address] [phone]  OR   [facility] [address] [place], [place] [address] [phone]  OR   [facility] V[facility] Tower [address]  [place], [place] [address]  OR   [facility] [address] [place], [place] [phone]  If scheduled at [facility], please arrive at the [facility] and Children's Entrance (Entrance C2) of [facility] 30 minutes prior to test start time. You can use the FREE valet parking offered at entrance C (encouraged to control the heart rate for the test)  Proceed to the [facility] Radiology Department (first floor) to check-in and test prep.  All radiology patients and guests should use entrance C2 at [facility], accessed from [facility], even though the hospital's physical address listed is [address].  If scheduled at the Heart and Vascular Tower at [address], please enter the parking lot using the [address] entrance and use the FREE valet service at the patient drop-off area. Enter the building and check-in with registration on the main floor.  If scheduled at [facility], please arrive to the Heart and Vascular Center 15 mins early for check-in and test prep.  There is spacious parking and easy access to the radiology department from the [facility] Heart and Vascular entrance. Please enter here and check-in with the desk attendant.   If scheduled at [facility], please arrive 30 minutes early for check-in and test prep.  Please follow these instructions carefully (unless otherwise directed):  An IV will be required for this test and Nitroglycerin  will be given.  Hold all erectile dysfunction medications at least 3 days (72 hrs) prior to test. (Ie viagra, cialis, sildenafil, tadalafil, etc)   On the Night Before the Test: Be sure to Drink plenty of water . Do not consume any caffeinated/decaffeinated beverages or chocolate 12 hours prior to your test. Do not take any antihistamines 12 hours prior to your test.   On the Day of the Test: Drink plenty of water  until 1 hour prior to the test. Do not eat any food 1 hour prior to test. You may take your regular medications prior to the test.  Take metoprolol  (Lopressor ) 100 MG two hours prior to test.  THIS HAS BEEN SENT TO YOUR PHARMACY F[facility]ALES- please wear underwire-free bra if available, avoid dresses & tight clothing   After the Test: Drink plenty of water . After receiving IV contrast, you may experience a mild flushed feeling. This is normal. On occasion, you may experience a mild rash up to 24 hours after the test. This is not dangerous. If this occurs, you can take  Benadryl 25 mg, Zyrtec , Claritin , or Allegra  and increase your fluid intake. (Patients taking Tikosyn should avoid Benadryl, and may take Zyrtec , Claritin , or Allegra ) If you experience trouble breathing, this can be serious. If it is severe call 911 IMMEDIATELY. If it is mild, please call our office.  We will call to schedule your test 2-4 weeks out understanding that some insurance companies will need an authorization prior to the service being performed.   For more information and frequently asked questions, please visit our website : [email or web address]  For non-scheduling related questions, please contact the cardiac imaging nurse navigator should you have any questions/concerns: Cardiac Imaging Nurse Navigators Direct Office Dial: [phone]   For scheduling needs, including cancellations and rescheduling, please call Brittany, [phone].     Follow-Up: At [facility], you and your health needs are our priority.  As part of our continuing mission to provide you with exceptional heart care, our providers are all part of one team.  This team includes your primary Cardiologist (physician) and Advanced Practice Providers or APPs (Physician Assistants and Nurse Practitioners) who all work together to provide you with the care you need, when you need it.  Your next appointment:   3 month(s)  Provider:   Glendia Ferrier, PA-C          We recommend signing up for the patient portal called MyChart.  Sign up information is provided on this After Visit Summary.  MyChart is used to connect with patients for Virtual Visits (Telemedicine).  Patients are able to view lab/test results, encounter notes, upcoming appointments, etc.  Non-urgent messages can be sent to your provider as well.   To learn more about what you can do with MyChart, go to [email or web address].   Other Instructions

## 2024-07-23 ENCOUNTER — Ambulatory Visit (HOSPITAL_COMMUNITY)
Admission: RE | Admit: 2024-07-23 | Discharge: 2024-07-23 | Disposition: A | Source: Ambulatory Visit | Attending: Physician Assistant | Admitting: Physician Assistant

## 2024-07-23 ENCOUNTER — Ambulatory Visit (HOSPITAL_COMMUNITY)
Admission: RE | Admit: 2024-07-23 | Discharge: 2024-07-23 | Attending: Physician Assistant | Admitting: Physician Assistant

## 2024-07-23 DIAGNOSIS — R0609 Other forms of dyspnea: Secondary | ICD-10-CM

## 2024-07-23 DIAGNOSIS — R072 Precordial pain: Secondary | ICD-10-CM

## 2024-07-23 DIAGNOSIS — I10 Essential (primary) hypertension: Secondary | ICD-10-CM | POA: Diagnosis not present

## 2024-07-23 DIAGNOSIS — R0602 Shortness of breath: Secondary | ICD-10-CM

## 2024-07-23 LAB — ECHOCARDIOGRAM COMPLETE
Area-P 1/2: 2.69 cm2
Calc EF: 60.5 %
MV M vel: 4.05 m/s
MV Peak grad: 65.4 mmHg
S' Lateral: 2.8 cm
Single Plane A2C EF: 59.4 %
Single Plane A4C EF: 62.3 %

## 2024-07-23 MED ORDER — NITROGLYCERIN 0.4 MG SL SUBL
0.8000 mg | SUBLINGUAL_TABLET | Freq: Once | SUBLINGUAL | Status: AC
  Administered 2024-07-23: 0.8 mg via SUBLINGUAL

## 2024-07-23 MED ORDER — IOHEXOL 350 MG/ML SOLN
100.0000 mL | Freq: Once | INTRAVENOUS | Status: AC | PRN
  Administered 2024-07-23: 100 mL via INTRAVENOUS

## 2024-07-23 NOTE — Progress Notes (Signed)
  Echocardiogram 2D Echocardiogram has been performed.  Koleen KANDICE Popper, RDCS 07/23/2024, 1:52 PM

## 2024-07-24 ENCOUNTER — Encounter: Payer: Self-pay | Admitting: Physician Assistant

## 2024-07-24 ENCOUNTER — Ambulatory Visit: Payer: Self-pay | Admitting: Physician Assistant

## 2024-07-24 DIAGNOSIS — E7800 Pure hypercholesterolemia, unspecified: Secondary | ICD-10-CM

## 2024-07-24 DIAGNOSIS — I2510 Atherosclerotic heart disease of native coronary artery without angina pectoris: Secondary | ICD-10-CM | POA: Insufficient documentation

## 2024-07-31 ENCOUNTER — Ambulatory Visit: Admitting: Family Medicine

## 2024-07-31 ENCOUNTER — Other Ambulatory Visit: Payer: Self-pay | Admitting: Medical Genetics

## 2024-08-02 ENCOUNTER — Other Ambulatory Visit: Payer: Self-pay | Admitting: Physician Assistant

## 2024-08-06 ENCOUNTER — Inpatient Hospital Stay: Admission: RE | Admit: 2024-08-06 | Discharge: 2024-08-06 | Attending: Family Medicine

## 2024-08-06 DIAGNOSIS — Z1231 Encounter for screening mammogram for malignant neoplasm of breast: Secondary | ICD-10-CM

## 2024-08-26 DIAGNOSIS — R002 Palpitations: Secondary | ICD-10-CM

## 2024-08-28 ENCOUNTER — Ambulatory Visit: Admitting: Family Medicine

## 2024-09-05 ENCOUNTER — Other Ambulatory Visit: Payer: Self-pay | Admitting: Family Medicine

## 2024-09-05 DIAGNOSIS — G8929 Other chronic pain: Secondary | ICD-10-CM

## 2024-09-07 ENCOUNTER — Other Ambulatory Visit: Payer: Self-pay | Admitting: Family Medicine

## 2024-09-07 ENCOUNTER — Ambulatory Visit: Admitting: Family Medicine

## 2024-09-07 VITALS — BP 144/84 | HR 81 | Ht 66.0 in | Wt 274.6 lb

## 2024-09-07 DIAGNOSIS — M545 Low back pain, unspecified: Secondary | ICD-10-CM | POA: Diagnosis not present

## 2024-09-07 DIAGNOSIS — Z1211 Encounter for screening for malignant neoplasm of colon: Secondary | ICD-10-CM

## 2024-09-07 DIAGNOSIS — G8929 Other chronic pain: Secondary | ICD-10-CM

## 2024-09-07 DIAGNOSIS — Z780 Asymptomatic menopausal state: Secondary | ICD-10-CM

## 2024-09-07 DIAGNOSIS — R5383 Other fatigue: Secondary | ICD-10-CM

## 2024-09-07 DIAGNOSIS — E1169 Type 2 diabetes mellitus with other specified complication: Secondary | ICD-10-CM

## 2024-09-07 DIAGNOSIS — I10 Essential (primary) hypertension: Secondary | ICD-10-CM | POA: Diagnosis not present

## 2024-09-07 MED ORDER — SEMAGLUTIDE(0.25 OR 0.5MG/DOS) 2 MG/1.5ML ~~LOC~~ SOPN: 0.2500 mg | PEN_INJECTOR | SUBCUTANEOUS | 3 refills | Status: AC

## 2024-09-07 MED ORDER — BUPROPION HCL ER (XL) 150 MG PO TB24: 300.0000 mg | ORAL_TABLET | Freq: Every day | ORAL | 1 refills | Status: AC

## 2024-09-07 MED ORDER — LOSARTAN POTASSIUM 50 MG PO TABS: 50.0000 mg | ORAL_TABLET | Freq: Every day | ORAL | 2 refills | Status: AC

## 2024-09-07 MED ORDER — GABAPENTIN 300 MG PO CAPS: 300.0000 mg | ORAL_CAPSULE | Freq: Three times a day (TID) | ORAL | 1 refills | Status: AC

## 2024-09-07 NOTE — Assessment & Plan Note (Signed)
 Refill gabapentin  300 mg 3 times daily at patient request

## 2024-09-07 NOTE — Assessment & Plan Note (Signed)
 Continues to have borderline control, 140s/80s.  Will increase losartan  and continue home monitoring. -Increase to losartan  50 mg daily, continue amlodipine  5 mg daily -Follow-up in 1 month for BP check

## 2024-09-07 NOTE — Progress Notes (Signed)
" ° ° °  SUBJECTIVE:   CHIEF COMPLAINT / HPI:   Hypertension: - Medications: Losartan  25 mg daily, amlodipine  5 mg daily - Compliance: Yes - Checking BP at home: 140/90-80s. No lows on diastolic.  - Denies any SOB, CP, vision changes, LE edema, medication SEs, or symptoms of hypotension  Fatigue Reports persistent daytime fatigue.  Working third shift at [employer] but has been doing this for some time.  Also reporting significant snoring, wondering about sleep referral.  Previously diagnosed with OSA but not on CPAP.  Also feels like she has difficulty losing weight.  PERTINENT  PMH / PSH: HTN, CAD, OSA  OBJECTIVE:   BP (!) 144/84   Pulse 81   Ht 5' 6 (1.676 m)   Wt 274 lb 9.6 oz (124.6 kg)   SpO2 97%   BMI 44.32 kg/m    General: NAD, pleasant, able to participate in exam HEENT: Moist mucous membranes.  No thyromegaly. Cardiac: RRR, no murmurs. Respiratory: CTAB, normal effort, No wheezes, rales or rhonchi Extremities: no edema or cyanosis. Skin: warm and dry, no rashes noted Neuro: alert, no obvious focal deficits Psych: Normal affect and mood  ASSESSMENT/PLAN:   Assessment & Plan Type 2 diabetes mellitus with other specified complication, without long-term current use of insulin (HCC) A1c well-controlled but desires weight loss therefore will transition to GLP-1 for management. -Start Ozempic  0.25 mg weekly x 4 weeks, increase to 0.5 mg weekly thereafter HYPERTENSION, BENIGN SYSTEMIC Continues to have borderline control, 140s/80s.  Will increase losartan  and continue home monitoring. -Increase to losartan  50 mg daily, continue amlodipine  5 mg daily -Follow-up in 1 month for BP check Chronic bilateral low back pain without sciatica Refill gabapentin  300 mg 3 times daily at patient request Screening for malignant neoplasm of colon Referred to GI for colonoscopy. Postmenopausal Referred for bone density scan. Low energy Daytime fatigue possibly in the setting of  OSA, provided information for sleep medicine referral.  Refill bupropion  300 mg daily at patient request   Dr. Izetta Nap, DO [facility]     "

## 2024-09-07 NOTE — Patient Instructions (Addendum)
 It was wonderful to see you today! Thank you for choosing [facility].   Please bring ALL of your medications with you to every visit.   Today we talked about:  Please increase to Losartan  50mg  daily for better blood pressure control.  Please continue to check your blood pressure at home with a goal of less than 130/80 although at your age it is acceptable be less than 140 for the top number. For your diabetes I did send in the Ozempic  which also helps with weight loss.  Anything coding at this weight should be covered by insurance without issue.  Please start in the 0.25 mg weekly dosing for 4 weeks and then increase to 0.5 mg weekly. I also referred you to the GI doctor for you to discuss having a colonoscopy done and I referred you for your bone density scan at a center nearby here to screen for osteoporosis as part of your health maintenance. Please call the sleep center with the information below.  I do have concern given how significant your snoring but this is likely the cause of your sleepiness and your inability to sleep well.  Please call them when convenient.  [facility] [address] [place], [place] [address] [phone] Fax  Please follow up in 1 month for blood pressure management  If you haven't already, sign up for My Chart to have easy access to your labs results, and communication with your primary care physician.   We are checking some labs today. If they are abnormal, I will call you. If they are normal, I will send you a MyChart message (if it is active) or a letter in the mail. If you do not hear about your labs in the next 2 weeks, please call the office.  Call the clinic at [phone] if your symptoms worsen or you have any concerns.  Please be sure to schedule follow up at the front desk before you leave today.   Izetta Nap, DO Family Medicine

## 2024-09-07 NOTE — Assessment & Plan Note (Signed)
 Daytime fatigue possibly in the setting of OSA, provided information for sleep medicine referral.  Refill bupropion  300 mg daily at patient request

## 2024-09-08 ENCOUNTER — Other Ambulatory Visit: Payer: Self-pay | Admitting: Family Medicine

## 2024-09-08 DIAGNOSIS — R232 Flushing: Secondary | ICD-10-CM

## 2024-09-08 DIAGNOSIS — E1169 Type 2 diabetes mellitus with other specified complication: Secondary | ICD-10-CM

## 2024-09-08 LAB — MICROALBUMIN / CREATININE URINE RATIO
Creatinine, Urine: 274.4 mg/dL
Microalb/Creat Ratio: 2 mg/g{creat} (ref 0–29)
Microalbumin, Urine: 6.1 ug/mL

## 2024-09-08 MED ORDER — VENLAFAXINE HCL ER 75 MG PO CP24: 75.0000 mg | ORAL_CAPSULE | Freq: Every day | ORAL | 0 refills | Status: AC

## 2024-09-08 NOTE — Progress Notes (Signed)
 Called patient regarding venlafaxine  prescription.   Patient says she takes 2 37.5 tablets together once a day. I told her I would change prescription to 75 mg once a day.

## 2024-09-09 ENCOUNTER — Other Ambulatory Visit (HOSPITAL_COMMUNITY): Payer: Self-pay

## 2024-09-09 ENCOUNTER — Telehealth: Payer: Self-pay

## 2024-09-09 ENCOUNTER — Ambulatory Visit: Payer: Self-pay | Admitting: Family Medicine

## 2024-09-09 NOTE — Telephone Encounter (Signed)
 Pharmacy Patient Advocate Encounter   Received notification from Physician's Office that prior authorization for OZEMPIC  0.25/0.5MG  is required/requested.   Insurance verification completed.   The patient is insured through [employer].   Per test claim: PA required; PA submitted to above mentioned insurance via Latent Key/confirmation #/EOC [facility] Status is pending

## 2024-09-14 NOTE — Telephone Encounter (Signed)
 Pharmacy Patient Advocate Encounter  Received notification from [employer] that Prior Authorization for OZEMPIC  0.25/0.5MG  has been APPROVED from 09/09/24 to 09/14/25   PA #/Case ID/Reference #: [id number]

## 2024-10-14 ENCOUNTER — Ambulatory Visit: Admitting: Physician Assistant
